# Patient Record
Sex: Female | Born: 1945
Health system: Southern US, Community
[De-identification: ages and names within clinical notes are randomized; demographics above are authoritative.]

## PROBLEM LIST (undated history)

## (undated) DIAGNOSIS — I4891 Unspecified atrial fibrillation: Secondary | ICD-10-CM

## (undated) DIAGNOSIS — R112 Nausea with vomiting, unspecified: Secondary | ICD-10-CM

## (undated) DIAGNOSIS — M1990 Unspecified osteoarthritis, unspecified site: Secondary | ICD-10-CM

## (undated) DIAGNOSIS — Z9889 Other specified postprocedural states: Secondary | ICD-10-CM

## (undated) DIAGNOSIS — C4442 Squamous cell carcinoma of skin of scalp and neck: Secondary | ICD-10-CM

## (undated) DIAGNOSIS — Z9289 Personal history of other medical treatment: Secondary | ICD-10-CM

## (undated) DIAGNOSIS — C44300 Unspecified malignant neoplasm of skin of unspecified part of face: Secondary | ICD-10-CM

## (undated) DIAGNOSIS — C9000 Multiple myeloma not having achieved remission: Secondary | ICD-10-CM

## (undated) HISTORY — PX: WISDOM TOOTH EXTRACTION: SHX21

## (undated) HISTORY — DX: Unspecified malignant neoplasm of skin of unspecified part of face: C44.300

## (undated) HISTORY — DX: Unspecified atrial fibrillation: I48.91

## (undated) HISTORY — PX: CATARACT EXTRACTION W/ INTRAOCULAR LENS  IMPLANT, BILATERAL: SHX1307

## (undated) HISTORY — PX: CHOLECYSTECTOMY OPEN: SUR202

## (undated) HISTORY — PX: TONSILLECTOMY AND ADENOIDECTOMY: SUR1326

## (undated) HISTORY — DX: Multiple myeloma not having achieved remission: C90.00

## (undated) HISTORY — PX: JOINT REPLACEMENT: SHX530

## (undated) HISTORY — DX: Unspecified osteoarthritis, unspecified site: M19.90

## (undated) HISTORY — PX: MOHS SURGERY: SUR867

## (undated) HISTORY — PX: OTHER SURGICAL HISTORY: SHX169

---

## 1978-11-07 DIAGNOSIS — Z9289 Personal history of other medical treatment: Secondary | ICD-10-CM

## 1978-11-07 HISTORY — DX: Personal history of other medical treatment: Z92.89

## 1998-03-24 ENCOUNTER — Other Ambulatory Visit: Admission: RE | Admit: 1998-03-24 | Discharge: 1998-03-24 | Payer: Self-pay | Admitting: Obstetrics and Gynecology

## 1998-03-27 ENCOUNTER — Other Ambulatory Visit: Admission: RE | Admit: 1998-03-27 | Discharge: 1998-03-27 | Payer: Self-pay | Admitting: Obstetrics and Gynecology

## 1998-11-07 DIAGNOSIS — C4442 Squamous cell carcinoma of skin of scalp and neck: Secondary | ICD-10-CM

## 1998-11-07 HISTORY — DX: Squamous cell carcinoma of skin of scalp and neck: C44.42

## 1999-03-25 ENCOUNTER — Other Ambulatory Visit: Admission: RE | Admit: 1999-03-25 | Discharge: 1999-03-25 | Payer: Self-pay | Admitting: Obstetrics and Gynecology

## 2000-03-24 ENCOUNTER — Other Ambulatory Visit: Admission: RE | Admit: 2000-03-24 | Discharge: 2000-03-24 | Payer: Self-pay | Admitting: Obstetrics and Gynecology

## 2001-03-26 ENCOUNTER — Other Ambulatory Visit: Admission: RE | Admit: 2001-03-26 | Discharge: 2001-03-26 | Payer: Self-pay | Admitting: Obstetrics and Gynecology

## 2002-06-11 ENCOUNTER — Other Ambulatory Visit: Admission: RE | Admit: 2002-06-11 | Discharge: 2002-06-11 | Payer: Self-pay | Admitting: Obstetrics and Gynecology

## 2003-07-09 ENCOUNTER — Other Ambulatory Visit: Admission: RE | Admit: 2003-07-09 | Discharge: 2003-07-09 | Payer: Self-pay | Admitting: Obstetrics and Gynecology

## 2004-09-27 ENCOUNTER — Other Ambulatory Visit: Admission: RE | Admit: 2004-09-27 | Discharge: 2004-09-27 | Payer: Self-pay | Admitting: Obstetrics and Gynecology

## 2004-09-28 ENCOUNTER — Ambulatory Visit: Payer: Self-pay | Admitting: Internal Medicine

## 2005-08-30 ENCOUNTER — Ambulatory Visit: Payer: Self-pay | Admitting: Internal Medicine

## 2005-11-17 ENCOUNTER — Ambulatory Visit: Payer: Self-pay | Admitting: Internal Medicine

## 2005-11-30 ENCOUNTER — Ambulatory Visit: Payer: Self-pay | Admitting: Internal Medicine

## 2005-12-15 ENCOUNTER — Other Ambulatory Visit: Admission: RE | Admit: 2005-12-15 | Discharge: 2005-12-15 | Payer: Self-pay | Admitting: Obstetrics & Gynecology

## 2006-07-19 ENCOUNTER — Ambulatory Visit: Payer: Self-pay | Admitting: Internal Medicine

## 2007-01-08 ENCOUNTER — Other Ambulatory Visit: Admission: RE | Admit: 2007-01-08 | Discharge: 2007-01-08 | Payer: Self-pay | Admitting: Obstetrics & Gynecology

## 2007-07-25 ENCOUNTER — Encounter: Payer: Self-pay | Admitting: Internal Medicine

## 2007-08-02 ENCOUNTER — Encounter: Payer: Self-pay | Admitting: Internal Medicine

## 2008-01-14 ENCOUNTER — Other Ambulatory Visit: Admission: RE | Admit: 2008-01-14 | Discharge: 2008-01-14 | Payer: Self-pay | Admitting: Obstetrics and Gynecology

## 2008-09-18 ENCOUNTER — Ambulatory Visit: Payer: Self-pay | Admitting: Internal Medicine

## 2008-10-13 ENCOUNTER — Encounter: Payer: Self-pay | Admitting: Internal Medicine

## 2009-01-01 ENCOUNTER — Encounter: Payer: Self-pay | Admitting: Internal Medicine

## 2009-01-19 ENCOUNTER — Other Ambulatory Visit: Admission: RE | Admit: 2009-01-19 | Discharge: 2009-01-19 | Payer: Self-pay | Admitting: Obstetrics and Gynecology

## 2010-01-07 ENCOUNTER — Encounter: Payer: Self-pay | Admitting: Internal Medicine

## 2010-01-08 ENCOUNTER — Encounter: Payer: Self-pay | Admitting: Internal Medicine

## 2010-01-20 ENCOUNTER — Ambulatory Visit: Payer: Self-pay | Admitting: Internal Medicine

## 2010-01-20 DIAGNOSIS — M19042 Primary osteoarthritis, left hand: Secondary | ICD-10-CM

## 2010-01-20 DIAGNOSIS — Z85828 Personal history of other malignant neoplasm of skin: Secondary | ICD-10-CM | POA: Insufficient documentation

## 2010-01-20 DIAGNOSIS — M19041 Primary osteoarthritis, right hand: Secondary | ICD-10-CM | POA: Insufficient documentation

## 2010-11-07 HISTORY — PX: SHOULDER ARTHROSCOPY W/ ROTATOR CUFF REPAIR: SHX2400

## 2010-12-07 NOTE — Assessment & Plan Note (Signed)
Summary: shingles vac/cbs  Nurse Visit       Zostavax # 1    Vaccine Type: Zostavax    Site: left deltoid    Mfr: Merck    Dose: 0.70mL    Route: [place]    Given by: Floydene Flock CMA    Exp. Date: 12/21/2009    Lot #: [id number]   Orders Added: 1)  Zoster (Shingles) Vaccine Live [90736] 2)  Admin 1st Vaccine [email or web address]    ]

## 2010-12-07 NOTE — Letter (Signed)
Summary: External Correspondence--[facility]  External Correspondence--[facility]   Imported By: Freddy Jaksch 08/01/2007 10:14:08  _____________________________________________________________________  External Attachment:    Type:   Image     Comment:   External Document

## 2010-12-07 NOTE — Assessment & Plan Note (Signed)
Summary: [facility]///[facility]   Vital Signs:  Patient profile:   65 year old Jennifer [name] Height:      65.75 inches Weight:      161.4 pounds BMI:     26.34 Temp:     97.8 degrees F oral Pulse rate:   67 / minute Resp:     14 per minute BP sitting:   118 / 72  (left arm) Cuff size:   large  Vitals Entered By: Shonna Chock (January 20, 2010 9:46 AM)  Comments REVIEWED MED LIST, PATIENT AGREED DOSE AND INSTRUCTION CORRECT    History of Present Illness: Jennifer Jennifer [name] is here for a physical; she has chronic LS pain. Chiropractry & stretching are  beneficial as is a tilt table. Preventive Care interventions reviewed ; all up to date including shingles immunization & flu shot. Living Will in place  Preventive Screening-Counseling & Management  Alcohol-Tobacco     Smoking Status: quit  Caffeine-Diet-Exercise     Does Patient Exercise: yes  Allergies (verified): No Known Drug Allergies  Past History:  Past Medical History: Post Menopausal Skin cancer, hx of,squamous cell of scalp X 2 , Dr Danella Deis  Past Surgical History: Colonoscopy X 4 , all negative; G 3 P 1 M 2; Wisdom Teeth Extraction Cholecystectomy Squamous cell cancer of scalp resected X 2  Family History: Father: CHF, accelerated dementia Mother: Myasthenia Gravis, CAD, dementia Siblings: bro DJD , prostate CA; M uncle CA ? primary; PGM breast CA; P aunt breast CA  Social History: Occupation: Psychologist, sport and exercise Married Former Smoker: quit 1980 Alcohol use-yes: socially Regular exercise-yes: CVE as walking 45-60 min w/o symptoms Smoking Status:  quit Does Patient Exercise:  yes  Review of Systems  The patient denies anorexia, fever, vision loss, decreased hearing, hoarseness, chest pain, syncope, dyspnea on exertion, peripheral edema, prolonged cough, headaches, hemoptysis, abdominal pain, melena, hematochezia, severe indigestion/heartburn, suspicious skin lesions, depression, unusual weight change, abnormal bleeding,  enlarged lymph nodes, and angioedema.         Weight gain of 15# with travel. GU:  Denies discharge, dysuria, hematuria, and incontinence; Shirlyn Goltz NP seen annually. MS:  Complains of joint pain and low back pain; denies joint redness, joint swelling, mid back pain, muscle weakness, and thoracic pain; Hip & LBP ; Rx: Glucosamine with some benefit. Neuro:  Denies brief paralysis, numbness, poor balance, tingling, and weakness; No radicular leg pain.  Physical Exam  General:  well-nourished; alert,appropriate and cooperative throughout examination Head:  Normocephalic and atraumatic without obvious abnormalities.  Eyes:  No corneal or conjunctival inflammation noted. EOMI. Perrla. Funduscopic exam benign, without hemorrhages, exudates or papilledema. Vision grossly normal. Ears:  External ear exam shows no significant lesions or deformities.  Otoscopic examination reveals clear canals, tympanic membranes are intact bilaterally without bulging, retraction, inflammation or discharge. Hearing is grossly normal bilaterally. Nose:  External nasal examination shows no deformity or inflammation. Nasal mucosa are pink and moist without lesions or exudates. Mouth:  Oral mucosa and oropharynx without lesions or exudates.  Teeth in good repair. Neck:  No deformities, masses, or tenderness noted.Minimal asymmetry of thyroid ; R > L. No nodules Lungs:  Normal respiratory effort, chest expands symmetrically. Lungs are clear to auscultation, no crackles or wheezes. Heart:  regular rhythm, no murmur, no gallop, no rub, no JVD, no HJR, and bradycardia.   Abdomen:  Bowel sounds positive,abdomen soft and non-tender without masses, organomegaly or hernias noted. Aortic bruit w/o AAA Genitalia:  Jennifer Grubb,NP Msk:  No  deformity or scoliosis noted of thoracic or lumbar spine.   Pulses:  R and L carotid,radial,dorsalis pedis and posterior tibial pulses are full and equal bilaterally Extremities:  No clubbing,  cyanosis, edema, or deformity noted with normal full range of motion of all joints.  Neg SLR; minor crepitus in knees  Neurologic:  alert & oriented X3, strength normal in all extremities, and DTRs symmetrical and normal.   Skin:  Intact without suspicious lesions or rashes Cervical Nodes:  No lymphadenopathy noted Axillary Nodes:  No palpable lymphadenopathy Psych:  memory intact for recent and remote, normally interactive, and good eye contact.     Impression & Recommendations:  Problem # 1:  PREVENTIVE HEALTH CARE (ICD-V70.0)  Orders: EKG w/ Interpretation (93000)  Problem # 2:  DEGENERATIVE JOINT DISEASE (ICD-715.90)  Her updated medication list for this problem includes:    Tramadol Hcl 50 Mg Tabs (Tramadol hcl) .Marland Kitchen... 1 q 6 hrs as needed joint pain  Problem # 3:  SKIN CANCER, HX OF (ICD-V10.83) as per Dr Danella Deis  Complete Medication List: 1)  Prempro 0.625-2.5 Mg Tabs (Conj estrog-medroxyprogest ace) .Marland Kitchen.. 1 by mouth once daily 2)  Tramadol Hcl 50 Mg Tabs (Tramadol hcl) .Marland Kitchen.. 1 q 6 hrs as needed joint pain  Patient Instructions: 1)  Carry corrected Health Record when traveling or seeing any health care providers Prescriptions: TRAMADOL HCL 50 MG TABS (TRAMADOL HCL) 1 q 6 hrs as needed joint pain  #30 x 5   Entered and Authorized by:   Marga Melnick MD   Signed by:   Marga Melnick MD on 01/20/2010   Method used:   Print then Give to Patient   RxID:   [id number]

## 2010-12-07 NOTE — Letter (Signed)
Summary: [facility]  [facility] [place]   Imported By: Lanelle Bal 10/22/2008 10:16:48  _____________________________________________________________________  External Attachment:    Type:   Image     Comment:   External Document

## 2010-12-07 NOTE — Letter (Signed)
Summary: IMAGING-BONE DENSITY  IMAGING-BONE DENSITY   Imported By: Doristine Devoid 08/17/2007 16:38:56  _____________________________________________________________________  External Attachment:    Type:   Image     Comment:   External Document

## 2010-12-08 DIAGNOSIS — I4891 Unspecified atrial fibrillation: Secondary | ICD-10-CM

## 2010-12-08 HISTORY — DX: Unspecified atrial fibrillation: I48.91

## 2011-02-03 ENCOUNTER — Encounter: Payer: Self-pay | Admitting: Internal Medicine

## 2011-03-15 ENCOUNTER — Ambulatory Visit (INDEPENDENT_AMBULATORY_CARE_PROVIDER_SITE_OTHER): Payer: Medicare Other

## 2011-03-15 DIAGNOSIS — Z23 Encounter for immunization: Secondary | ICD-10-CM

## 2011-03-15 MED ORDER — TETANUS-DIPHTH-ACELL PERTUSSIS 5-2.5-18.5 LF-MCG/0.5 IM SUSP
0.5000 mL | Freq: Once | INTRAMUSCULAR | Status: AC
  Administered 2011-03-15: 0.5 mL via INTRAMUSCULAR

## 2011-03-25 NOTE — Assessment & Plan Note (Signed)
[facility]                                   ON-CALL NOTE   NAME:THORNEShuronda, Jennifer                        MRN:          [id number]  DATE:05/20/2006                            DOB:          Dec 05, 1945    PHONE CONSULT - 9:24 A.M.  Phone number [phone]   OBJECTIVE:  The patient has burning and dysuria with urgency.  She feels she  has a bladder infection and is in [place], [place], and would like an  antibiotic called in.  The patient is a personal friend and a patient of Dr.  Alwyn Ren, longstanding.  I spoke with Dr. Alwyn Ren, who is on call today, and he  suggests calling in antibiotic to her.  The patient has no known drug  allergies.   ASSESSMENT:  Presumed urinary infection.   PLAN:  Trial of Macrobid 100 mg b.i.d., #20 and no refills and Pyridium 200  mg t.i.d. p.r.n., #9 and no refills, was called to [facility] at [phone].  The  patient knows to come if symptoms do not improve.  Primary care [place] is  Dr. Alwyn Ren.  Home office is [place].                                   Arta Silence, MD   RNS/MedQ  DD:  05/20/2006  DT:  05/20/2006  Job #:  [id number]   cc:   Titus Dubin. Alwyn Ren, MD, FCCP

## 2011-09-12 ENCOUNTER — Ambulatory Visit
Admission: RE | Admit: 2011-09-12 | Discharge: 2011-09-12 | Disposition: A | Discharge status: Home / Self Care | Payer: Medicare Other | Source: Ambulatory Visit | Attending: Orthopaedic Surgery | Admitting: Orthopaedic Surgery

## 2011-09-12 ENCOUNTER — Other Ambulatory Visit: Payer: Self-pay | Admitting: Orthopaedic Surgery

## 2011-09-12 DIAGNOSIS — M25511 Pain in right shoulder: Secondary | ICD-10-CM

## 2012-02-22 ENCOUNTER — Other Ambulatory Visit: Payer: Self-pay | Admitting: Orthopaedic Surgery

## 2012-02-22 ENCOUNTER — Ambulatory Visit
Admission: RE | Admit: 2012-02-22 | Discharge: 2012-02-22 | Disposition: A | Discharge status: Home / Self Care | Payer: BC Managed Care – PPO | Source: Ambulatory Visit | Attending: Orthopaedic Surgery | Admitting: Orthopaedic Surgery

## 2012-02-22 DIAGNOSIS — M25561 Pain in right knee: Secondary | ICD-10-CM

## 2012-03-13 ENCOUNTER — Telehealth: Payer: Self-pay | Admitting: Internal Medicine

## 2012-03-13 NOTE — Telephone Encounter (Signed)
Pt is calling to find out if you have a recommendation for an ENT in the [place] area. Pt states she is living at [facility] and having some problems with her ear.

## 2012-03-13 NOTE — Telephone Encounter (Signed)
I am so sorry , but I do not know any specialists there

## 2012-03-13 NOTE — Telephone Encounter (Signed)
Discuss with patient  

## 2012-03-13 NOTE — Telephone Encounter (Signed)
Left message to call office

## 2012-03-14 ENCOUNTER — Encounter: Payer: Self-pay | Admitting: Internal Medicine

## 2012-03-14 ENCOUNTER — Ambulatory Visit (INDEPENDENT_AMBULATORY_CARE_PROVIDER_SITE_OTHER): Payer: Medicare Other | Admitting: Internal Medicine

## 2012-03-14 VITALS — BP 120/72 | HR 86 | Temp 97.6°F | Wt 156.8 lb

## 2012-03-14 DIAGNOSIS — H9209 Otalgia, unspecified ear: Secondary | ICD-10-CM

## 2012-03-14 DIAGNOSIS — H9202 Otalgia, left ear: Secondary | ICD-10-CM

## 2012-03-14 DIAGNOSIS — S0300XA Dislocation of jaw, unspecified side, initial encounter: Secondary | ICD-10-CM

## 2012-03-14 NOTE — Patient Instructions (Signed)
Use Eucerin ,a  moisturizing agent , mixed one part to one part of Cort Aid  and applied twice a day into canal. Consider glucosamine sulfate 1500 mg daily for joint symptoms. Take this daily  for 3 months and then leave it off for 2 months. This will rehydrate the cartilages.

## 2012-03-14 NOTE — Progress Notes (Signed)
  Subjective:    Patient ID: Jennifer [name], female    DOB: 1946/09/30, 66 y.o.   MRN: [id number]  HPI She's noted a "crusty or itchy" sensation in the left ear canal for approximately 3 months. She feels there is some swelling based on some resistance when she places a Q-tip in the ear. Her pharmacist recommended olive oil which has not improved the situation. There has been some associated hearing loss, but there's been no significant pain , tinnitus or discharge    Review of Systems she denies frontal headaches, facial pain, nasal purulence or dental pain. She also has not had fever, chills, or sweats.     Objective:   Physical Exam General appearance:good health ;well nourished; no acute distress or increased work of breathing is present.  No  lymphadenopathy about the head, neck, or axilla noted.   Eyes: No conjunctival inflammation or lid edema is present.   Ears:  External ear exam shows no significant lesions or deformities.  Otoscopic examination reveals clear canals, but the left canal appear to be decreased in diameter by increase in the diameter inferior wall. There is no erythema or tenderness in this area when the speculum is advanced.The tympanic membranes are intact bilaterally without bulging, retraction, inflammation or discharge. She has dramatic temporal mandibular joint findings on the left with some dislocation and clicking  Nose:  External nasal examination shows no deformity or inflammation. Nasal mucosa are pink and moist without lesions or exudates. No septal dislocation or deviation.No obstruction to airflow.   Oral exam: Dental hygiene is good; lips and gums are healthy appearing.There is no oropharyngeal erythema or exudate noted.   Neck:  No deformities, thyromegaly, masses, or tenderness noted. Physiologic asymmetry of the thyroid is present without nodularity.  Supple with full range of motion without pain.             Assessment & Plan:  #1 subjective  changes in the left ear without definite  active infection. There may be some accentuation of the tissues inferiorly in the left otic canal.  #2 significant temporomandibular joint dysfunction, left greater than right.  Plan: The testing will be recommended. I do recommend an ear nose and throat evaluation of the questionable changes inferiorly in the left otic canal is symptoms persist after a trial of topical steroids & NSAIDS

## 2012-03-28 ENCOUNTER — Encounter: Payer: Self-pay | Admitting: Internal Medicine

## 2012-04-10 ENCOUNTER — Encounter: Payer: Self-pay | Admitting: Internal Medicine

## 2012-10-12 ENCOUNTER — Encounter: Payer: Self-pay | Admitting: Internal Medicine

## 2012-10-12 ENCOUNTER — Ambulatory Visit (INDEPENDENT_AMBULATORY_CARE_PROVIDER_SITE_OTHER): Payer: Medicare Other | Admitting: Internal Medicine

## 2012-10-12 VITALS — BP 118/80 | HR 62 | Resp 12 | Ht 67.0 in | Wt 158.4 lb

## 2012-10-12 DIAGNOSIS — Z23 Encounter for immunization: Secondary | ICD-10-CM

## 2012-10-12 DIAGNOSIS — I739 Peripheral vascular disease, unspecified: Secondary | ICD-10-CM

## 2012-10-12 DIAGNOSIS — Z85828 Personal history of other malignant neoplasm of skin: Secondary | ICD-10-CM

## 2012-10-12 DIAGNOSIS — I4891 Unspecified atrial fibrillation: Secondary | ICD-10-CM

## 2012-10-12 DIAGNOSIS — Z0000 Encounter for general adult medical examination without abnormal findings: Secondary | ICD-10-CM

## 2012-10-12 LAB — CBC WITH DIFFERENTIAL/PLATELET
Basophils Absolute: 0 10*3/uL (ref 0.0–0.1)
Basophils Relative: 0 % (ref 0–1)
Eosinophils Absolute: 0 10*3/uL (ref 0.0–0.7)
Eosinophils Relative: 0 % (ref 0–5)
HCT: 38.6 % (ref 36.0–46.0)
Hemoglobin: 13.2 g/dL (ref 12.0–15.0)
Lymphocytes Relative: 12 % (ref 12–46)
Lymphs Abs: 0.9 10*3/uL (ref 0.7–4.0)
MCH: 31.9 pg (ref 26.0–34.0)
MCHC: 34.2 g/dL (ref 30.0–36.0)
MCV: 93.2 fL (ref 78.0–100.0)
Monocytes Absolute: 0 10*3/uL — ABNORMAL LOW (ref 0.1–1.0)
Monocytes Relative: 0 % — ABNORMAL LOW (ref 3–12)
Neutro Abs: 6.4 10*3/uL (ref 1.7–7.7)
Neutrophils Relative %: 88 % — ABNORMAL HIGH (ref 43–77)
Platelets: 255 10*3/uL (ref 150–400)
RBC: 4.14 MIL/uL (ref 3.87–5.11)
RDW: 12.6 % (ref 11.5–15.5)
WBC: 7.4 10*3/uL (ref 4.0–10.5)

## 2012-10-12 LAB — LIPID PANEL
Cholesterol: 234 mg/dL — ABNORMAL HIGH (ref 0–200)
HDL: 79 mg/dL (ref >39)
LDL Cholesterol: 143 mg/dL — ABNORMAL HIGH (ref 0–99)
Total CHOL/HDL Ratio: 3 Ratio
Triglycerides: 58 mg/dL (ref <150)
VLDL: 12 mg/dL (ref 0–40)

## 2012-10-12 LAB — BASIC METABOLIC PANEL
BUN: 19 mg/dL (ref 6–23)
CO2: 25 mEq/L (ref 19–32)
Calcium: 9.7 mg/dL (ref 8.4–10.5)
Chloride: 103 mEq/L (ref 96–112)
Creat: 0.85 mg/dL (ref 0.50–1.10)
Glucose, Bld: 115 mg/dL — ABNORMAL HIGH (ref 70–99)
Potassium: 4.2 mEq/L (ref 3.5–5.3)
Sodium: 136 mEq/L (ref 135–145)

## 2012-10-12 LAB — TSH: TSH: 1.031 u[IU]/mL (ref 0.350–4.500)

## 2012-10-12 NOTE — Progress Notes (Signed)
Subjective:    Patient ID: Jennifer [name], female    DOB: 07/21/1946, 66 y.o.   MRN: [id number]  HPI Medicare Wellness Visit:  The following psychosocial & medical history were reviewed as required by Medicare.   Social history: caffeine: 1 cup / day , alcohol: 7 shots/ week ,  tobacco use :quit 1972  & exercise : 12 hrs / week.   Home & personal  safety / fall risk: no issues, activities of daily living: no limitations , seatbelt use :yes , and smoke alarm employment : yes.  Power of Attorney/Living Will status : in place  Vision ( as recorded per Nurse) & Hearing  evaluation :  Ophth exam < 12 mos; no hearing exam. Orientation :oriented X 3 , memory & recall : good,  math testing: good,and mood & affect : normal . Depression / anxiety: denied Travel history : [place] 2007 , immunization status : up to date , transfusion history:  Post partum 1980, and preventive health surveillance ( colonoscopies, BMD , etc as per protocol/ [facility]): colonoscopy up to date, Dental care:  Every 6 mos . Chart reviewed &  Updated. Active issues reviewed & addressed.       Review of Systems Except for degenerative joint disease; she is essentially asymptomatic. As noted she exercises at a high level. She is on a heart healthy diet. Her right knee was injected today. She is also taking physical therapy.  She experiences muscle cramps if she exercises and heat. This is treated with magnesium. She denies any constitutional symptoms of fever, chills, sweats, or weight loss. She has no limb weakness, numbness or tingling. There's no incontinence of urine or stool.          Objective:   Physical Exam Gen.: Thin but healthy and well-nourished in appearance. Alert, appropriate and cooperative throughout exam. Head: Normocephalic without obvious abnormalities  Eyes: No corneal or conjunctival inflammation noted. Pterygium OS medially Ears: External  ear exam reveals no significant lesions or deformities. Acute  angle L canal Nose: External nasal exam reveals no deformity or inflammation. Nasal mucosa are pink and moist. No lesions or exudates noted.  Mouth: Oral mucosa and oropharynx reveal no lesions or exudates. Teeth in good repair. Neck: No deformities, masses, or tenderness noted. Range of motion &Thyroid normal. Lungs: Normal respiratory effort; chest expands symmetrically. Lungs are clear to auscultation without rales, wheezes, or increased work of breathing. Heart: Normal rate and rhythm. Normal S1 and S2. No gallop, click, or rub. S4 w/o murmur. Abdomen: Bowel sounds normal; abdomen soft and nontender. No masses, organomegaly or hernias noted. An aortic bruit is present; the aorta is palpable without enlargement Genitalia: Rock Nephew, NP Musculoskeletal/extremities: No deformity or scoliosis noted of  the thoracic or lumbar spine. No clubbing, cyanosis, edema noted. Minor degenerative joint changes of the fingers are present. Crepitus is present in the left knee. The right knee was not evaluated with range of motion or reflexes because of the injection today.Tone & strength  normal. Nail health  good. Vascular: Carotid, radial artery, dorsalis pedis and  posterior tibial pulses are full and equal. No bruits present. Neurologic: Alert and oriented x3. Deep tendon reflexes symmetrical in biceps          Skin: Intact without suspicious lesions or rashes. Lymph: No cervical, axillary lymphadenopathy present. Psych: Mood and affect are normal. Normally interactive  Assessment & Plan:  #1 Medicare Wellness Exam; criteria met ; data entered #2 DJD #3 aortic bruit without clinical evidence of aneurysm Plan: see Orders

## 2012-10-12 NOTE — Patient Instructions (Addendum)
If you activate My Chart; the results can be released to you as soon as they populate from the lab. If you choose not to use this program; the labs have to be reviewed, copied & mailed   causing a delay in getting the results to you. 

## 2012-12-05 ENCOUNTER — Encounter: Payer: Self-pay | Admitting: Internal Medicine

## 2012-12-22 ENCOUNTER — Other Ambulatory Visit: Payer: Self-pay

## 2013-01-16 ENCOUNTER — Telehealth: Payer: Self-pay | Admitting: Internal Medicine

## 2013-01-16 DIAGNOSIS — Z1211 Encounter for screening for malignant neoplasm of colon: Secondary | ICD-10-CM

## 2013-01-16 NOTE — Telephone Encounter (Signed)
Order placed, patient will be contacted by referral coordinator or GI once appointment set up

## 2013-01-16 NOTE — Telephone Encounter (Signed)
pt sent my chart message requesting colonoscopy

## 2013-01-17 ENCOUNTER — Encounter: Payer: Self-pay | Admitting: Internal Medicine

## 2013-02-05 HISTORY — PX: COLONOSCOPY: SHX174

## 2013-02-15 ENCOUNTER — Ambulatory Visit (AMBULATORY_SURGERY_CENTER): Payer: Medicare Other | Admitting: *Deleted

## 2013-02-15 VITALS — Ht 67.0 in | Wt 153.6 lb

## 2013-02-15 DIAGNOSIS — Z1211 Encounter for screening for malignant neoplasm of colon: Secondary | ICD-10-CM

## 2013-02-15 MED ORDER — MOVIPREP 100 G PO SOLR: 1.0000 | Freq: Once | ORAL | Status: DC

## 2013-02-15 NOTE — Progress Notes (Signed)
NO EGG OR SOY ALLERGY. EWM NO PROBLEMS WITH SEDATION IN THE PAST. EWM 

## 2013-03-08 ENCOUNTER — Encounter: Payer: Self-pay | Admitting: Internal Medicine

## 2013-03-08 ENCOUNTER — Ambulatory Visit (AMBULATORY_SURGERY_CENTER): Payer: Medicare Other | Admitting: Internal Medicine

## 2013-03-08 VITALS — BP 176/93 | HR 63 | Temp 97.1°F | Resp 16 | Ht 67.0 in | Wt 153.0 lb

## 2013-03-08 DIAGNOSIS — Z1211 Encounter for screening for malignant neoplasm of colon: Secondary | ICD-10-CM

## 2013-03-08 MED ORDER — SODIUM CHLORIDE 0.9 % IV SOLN: 500.0000 mL | INTRAVENOUS | Status: DC

## 2013-03-08 NOTE — Progress Notes (Signed)
Pt's blood pressure elevated. Pt denies hx of blood pressure problems or any medications usage. Pt states she has a headache rating it 8/10. Notified Dr. Juanda Chance, will continue to assess and evaluate.

## 2013-03-08 NOTE — Progress Notes (Signed)
Patient's position gradually changed to upright, readjusted bp cuff. Patient seen by CRNA and Dr. Juanda Chance. Patient had crangrape juice, denies any headache or other symptoms.cleared for discharge.no dizziness or any other symptoms on rising.

## 2013-03-08 NOTE — Progress Notes (Signed)
Report to pacu rn, vss, bbs=clear 

## 2013-03-08 NOTE — Progress Notes (Signed)
Patient did not experience any of the following events: a burn prior to discharge; a fall within the facility; wrong site/side/patient/procedure/implant event; or a hospital transfer or hospital admission upon discharge from the facility. (G8907) Patient did not have preoperative order for IV antibiotic SSI prophylaxis. (G8918)  

## 2013-03-08 NOTE — Op Note (Signed)
[facility] [address] [place], [address]   COLONOSCOPY PROCEDURE REPORT  PATIENT: Tashara, Suder  MR#: [id number] BIRTHDATE: 05-15-1946 , 66  yrs. old GENDER: Female ENDOSCOPIST: Hart Carwin, MD REFERRED BY:  recall colonoscopy PROCEDURE DATE:  03/08/2013 PROCEDURE:   Colonoscopy, screening ASA CLASS:   Class II INDICATIONS:Average risk patient for colon cancer and colonoscopy in1998,2004,maternal uncle and maternal nephew with colon cancer. MEDICATIONS: MAC sedation, administered by CRNA and Propofol (Diprivan) 220 mg IV  DESCRIPTION OF PROCEDURE:   After the risks and benefits and of the procedure were explained, informed consent was obtained.  A digital rectal exam revealed no abnormalities of the rectum.    The LB [id number]-[id number]  endoscope was introduced through the anus and advanced to the cecum, which was identified by both the appendix and ileocecal valve .  The quality of the prep was good, using MoviPrep .  The instrument was then slowly withdrawn as the colon was fully examined.     COLON FINDINGS: A normal appearing cecum, ileocecal valve, and appendiceal orifice were identified.  The ascending, hepatic flexure, transverse, splenic flexure, descending, sigmoid colon and rectum appeared unremarkable.  No polyps or cancers were seen. Retroflexed views revealed no abnormalities.     The scope was then withdrawn from the patient and the procedure completed.  COMPLICATIONS: There were no complications. ENDOSCOPIC IMPRESSION: Normal colon minimal diverticulosis of the descending colon  RECOMMENDATIONS: High fiber diet   REPEAT EXAM: In 10 year(s)  for Colonoscopy.  XB:[id number] Minerva Ends, MD  _______________________________ eSigned:  Hart Carwin, MD 03/08/2013 11:42 AM     PATIENT NAME:  Zaakirah, Kistner MR#: [id number]

## 2013-03-08 NOTE — Patient Instructions (Addendum)
Impressions/recommendations:  Normal colon  Diverticulosis (handout given) High Fiber Diet (handout given)  Repeat colonoscopy in 10 years.  YOU HAD AN ENDOSCOPIC PROCEDURE TODAY AT THE [facility]: Refer to the procedure report that was given to you for any specific questions about what was found during the examination.  If the procedure report does not answer your questions, please call your gastroenterologist to clarify.  If you requested that your care partner not be given the details of your procedure findings, then the procedure report has been included in a sealed envelope for you to review at your convenience later.  YOU SHOULD EXPECT: Some feelings of bloating in the abdomen. Passage of more gas than usual.  Walking can help get rid of the air that was put into your GI tract during the procedure and reduce the bloating. If you had a lower endoscopy (such as a colonoscopy or flexible sigmoidoscopy) you may notice spotting of blood in your stool or on the toilet paper. If you underwent a bowel prep for your procedure, then you may not have a normal bowel movement for a few days.  DIET: Your first meal following the procedure should be a light meal and then it is ok to progress to your normal diet.  A half-sandwich or bowl of soup is an example of a good first meal.  Heavy or fried foods are harder to digest and may make you feel nauseous or bloated.  Likewise meals heavy in dairy and vegetables can cause extra gas to form and this can also increase the bloating.  Drink plenty of fluids but you should avoid alcoholic beverages for 24 hours.  ACTIVITY: Your care partner should take you home directly after the procedure.  You should plan to take it easy, moving slowly for the rest of the day.  You can resume normal activity the day after the procedure however you should NOT DRIVE or use heavy machinery for 24 hours (because of the sedation medicines used during the test).    SYMPTOMS  TO REPORT IMMEDIATELY: A gastroenterologist can be reached at any hour.  During normal business hours, 8:30 AM to 5:00 PM Monday through Friday, call [phone].  After hours and on weekends, please call the GI answering service at [phone] who will take a message and have the physician on call contact you.   Following lower endoscopy (colonoscopy or flexible sigmoidoscopy):  Excessive amounts of blood in the stool  Significant tenderness or worsening of abdominal pains  Swelling of the abdomen that is new, acute  Fever of 100F or higher   FOLLOW UP: If any biopsies were taken you will be contacted by phone or by letter within the next 1-3 weeks.  Call your gastroenterologist if you have not heard about the biopsies in 3 weeks.  Our staff will call the home number listed on your records the next business day following your procedure to check on you and address any questions or concerns that you may have at that time regarding the information given to you following your procedure. This is a courtesy call and so if there is no answer at the home number and we have not heard from you through the emergency physician on call, we will assume that you have returned to your regular daily activities without incident.  SIGNATURES/CONFIDENTIALITY: You and/or your care partner have signed paperwork which will be entered into your electronic medical record.  These signatures attest to the fact that that the  information above on your After Visit Summary has been reviewed and is understood.  Full responsibility of the confidentiality of this discharge information lies with you and/or your care-partner.

## 2013-03-11 ENCOUNTER — Telehealth: Payer: Self-pay | Admitting: *Deleted

## 2013-03-11 NOTE — Telephone Encounter (Signed)
  Follow up Call-  Call back number 03/08/2013  Post procedure Call Back phone  # [phone]  Permission to leave phone message Yes     Patient questions:  Do you have a fever, pain , or abdominal swelling? no Pain Score  0 *  Have you tolerated food without any problems? yes  Have you been able to return to your normal activities? yes  Do you have any questions about your discharge instructions: Diet   no Medications  no Follow up visit  no  Do you have questions or concerns about your Care? no  Actions: * If pain score is 4 or above: No action needed, pain <4.  Patient states she checked her BP over few days and it came down. Denies any problems or concerns.

## 2013-03-21 ENCOUNTER — Encounter: Payer: Self-pay | Admitting: *Deleted

## 2013-03-22 ENCOUNTER — Ambulatory Visit (INDEPENDENT_AMBULATORY_CARE_PROVIDER_SITE_OTHER): Payer: Medicare Other | Admitting: Nurse Practitioner

## 2013-03-22 ENCOUNTER — Encounter: Payer: Self-pay | Admitting: Nurse Practitioner

## 2013-03-22 VITALS — BP 110/70 | Ht 65.5 in | Wt 153.0 lb

## 2013-03-22 DIAGNOSIS — Z01419 Encounter for gynecological examination (general) (routine) without abnormal findings: Secondary | ICD-10-CM

## 2013-03-22 DIAGNOSIS — Z780 Asymptomatic menopausal state: Secondary | ICD-10-CM

## 2013-03-22 MED ORDER — MEDROXYPROGESTERONE ACETATE 2.5 MG PO TABS: ORAL_TABLET | ORAL | Status: DC

## 2013-03-22 MED ORDER — ESTRADIOL 0.5 MG PO TABS: ORAL_TABLET | ORAL | Status: DC

## 2013-03-22 NOTE — Progress Notes (Signed)
67 y.o. Married Caucasian Fe here for annual exam.  Feels well no new health concerns.  No vaso symptoms.  Wants to continue with HRT.  Patient's last menstrual period was 11/07/1996.          Sexually active: no  The current method of family planning is abstinence.    Exercising: yes  pickleball, weights & golf Smoker:  no  Health Maintenance: Pap:  01-28-10 neg MMG:  02-20-13  Colonoscopy:  02/18/13 normal repeat in 10 years BMD:   03/19/12 normal TDaP:  2012 Labs: done at PCP   reports that she quit smoking about 42 years ago. She has never used smokeless tobacco. She reports that she drinks about 4.2 ounces of alcohol per week. She reports that she does not use illicit drugs.  Past Medical History  Diagnosis Date  . DJD (degenerative joint disease)   . Blood transfusion without reported diagnosis   . Atrial fibrillation 12/2010    PMH of ; [place] , [place] [facility]  . Cancer     SKIN CANCER IN [facility]ALP    Past Surgical History  Procedure Laterality Date  . G 3 p 1    . Colonoscopy      negative X 3; Dr Juanda Chance  . Cholecystectomy    . T&a    . Wisdom tooth extraction    . Shoulder surgery  2012    R ; Dr Cleophas Dunker for rotator tear    Current Outpatient Prescriptions  Medication Sig Dispense Refill  . estradiol (ESTRACE) 0.5 MG tablet Take 0.5 mg by mouth. 1/2 by mouth daily      . medroxyPROGESTERone (PROVERA) 2.5 MG tablet Take 2.5 mg by mouth. Take 1/2 tablet daily       No current facility-administered medications for this visit.    Family History  Problem Relation Age of Onset  . Atrial fibrillation Father   . Asthma Brother   . Colon cancer Neg Hx   . Rectal cancer Neg Hx   . Stomach cancer Neg Hx   . Alzheimer's disease Mother   . Myasthenia gravis Mother     ocular  . Breast cancer Mother   . Depression Daughter   . Breast cancer Paternal Grandmother     ROS:  Pertinent items are noted in HPI.  Otherwise, a comprehensive ROS was negative.  Exam:   BP 110/70   Ht 5' 5.5" (1.664 m)  Wt 153 lb (69.4 kg)  BMI 25.06 kg/m2  LMP 11/07/1996 Height: 5' 5.5" (166.4 cm)  Ht Readings from Last 3 Encounters:  03/22/13 5' 5.5" (1.664 m)  03/08/13 5\' 7"  (1.702 m)  02/15/13 5\' 7"  (1.702 m)    General appearance: alert, cooperative and appears stated age Head: Normocephalic, without obvious abnormality, atraumatic Neck: no adenopathy, supple, symmetrical, trachea midline and thyroid normal to inspection and palpation Lungs: clear to auscultation bilaterally Breasts: normal appearance, no masses or tenderness Heart: regular rate and rhythm Abdomen: soft, non-tender; no masses,  no organomegaly Extremities: extremities normal, atraumatic, no cyanosis or edema Skin: Skin color, texture, turgor normal. No rashes or lesions Lymph nodes: Cervical, supraclavicular, and axillary nodes normal. No abnormal inguinal nodes palpated Neurologic: Grossly normal   Pelvic: External genitalia:  no lesions              Urethra:  normal appearing urethra with no masses, tenderness or lesions              Bartholin's and Skene's: normal  Vagina: normal appearing vagina with normal color and discharge, no lesions              Cervix: anteverted              Pap taken: no Bimanual Exam:  Uterus:  normal size, contour, position, consistency, mobility, non-tender              Adnexa: no mass, fullness, tenderness               Rectovaginal: Confirms               Anus:  normal sphincter tone, no lesions  A:  Well Woman with normal exam  Postmenopausal on HRT    P:   Pap smear as per guidelines   Mammogram due 4/15  Discussed continued therapy with HRT and patient wants to continue.    She is aware of potential side effects of HRT including  DVT, CVA, cancer, and etc  Refill HRT for another year    return annually or prn  An After Visit Summary was printed and given to the patient.

## 2013-03-22 NOTE — Patient Instructions (Addendum)

## 2013-03-25 ENCOUNTER — Ambulatory Visit: Payer: Self-pay | Admitting: Nurse Practitioner

## 2013-03-26 NOTE — Progress Notes (Signed)
Encounter reviewed by Dr. Ivery Nanney Silva.  

## 2013-04-02 ENCOUNTER — Encounter: Payer: Self-pay | Admitting: Internal Medicine

## 2013-09-12 ENCOUNTER — Other Ambulatory Visit: Payer: Self-pay

## 2013-11-07 DIAGNOSIS — C44300 Unspecified malignant neoplasm of skin of unspecified part of face: Secondary | ICD-10-CM

## 2013-11-07 HISTORY — DX: Unspecified malignant neoplasm of skin of unspecified part of face: C44.300

## 2014-01-14 ENCOUNTER — Telehealth: Payer: Self-pay | Admitting: Orthopedic Surgery

## 2014-01-14 NOTE — Telephone Encounter (Signed)
Initiated PA for estradiol. Case # [id number] started. Has gone to pharmacist for consultation. Decision should be made within 48 hours.

## 2014-03-28 ENCOUNTER — Ambulatory Visit (INDEPENDENT_AMBULATORY_CARE_PROVIDER_SITE_OTHER): Payer: Medicare Other | Admitting: Nurse Practitioner

## 2014-03-28 ENCOUNTER — Encounter: Payer: Self-pay | Admitting: Nurse Practitioner

## 2014-03-28 VITALS — BP 132/84 | HR 64 | Ht 66.0 in | Wt 146.0 lb

## 2014-03-28 DIAGNOSIS — Z780 Asymptomatic menopausal state: Secondary | ICD-10-CM

## 2014-03-28 DIAGNOSIS — N951 Menopausal and female climacteric states: Secondary | ICD-10-CM

## 2014-03-28 DIAGNOSIS — Z01419 Encounter for gynecological examination (general) (routine) without abnormal findings: Secondary | ICD-10-CM

## 2014-03-28 MED ORDER — MEDROXYPROGESTERONE ACETATE 2.5 MG PO TABS: ORAL_TABLET | ORAL | Status: DC

## 2014-03-28 MED ORDER — ESTRADIOL 0.5 MG PO TABS: ORAL_TABLET | ORAL | Status: DC

## 2014-03-28 NOTE — Progress Notes (Signed)
Patient ID: Jennifer [name], female   DOB: 07/28/1946, 68 y.o.   MRN: [id number] 67 y.o. [id number] Married Caucasian Fe here for annual exam.  Trying to taperiing off HRT for a year and now wil try off unless she has more vaso symptoms in the winter.  She looks great and is still very active. 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  Patient's last menstrual period was 11/07/1996.          Sexually active: no  The current method of family planning is post menopausal status.    Exercising: yes  pickle ball, golf, biking Smoker:  no  Health Maintenance: Pap: 01-28-10 neg  MMG: 03/28/2014, no results at time of visit Colonoscopy: 02/18/13 normal repeat in 10 years  BMD: 03/28/2014, no results at time of visit TDaP: 03/15/2011  Labs: done at PCP   reports that she quit smoking about 43 years ago. She has never used smokeless tobacco. She reports that she drinks about 4.2 ounces of alcohol per week. She reports that she does not use illicit drugs.  Past Medical History  Diagnosis Date  . DJD (degenerative joint disease)   . Atrial fibrillation 12/2010    PMH of ; [place] , [place] [facility]  . Cancer 2000    SKIN CANCER IN [facility]  . Blood transfusion during current hospitalization 9485    complication after childbirth    Past Surgical History  Procedure Laterality Date  . G 3 p 1    . Colonoscopy  02/2013    negative X 3; Dr Olevia Perches  . Cholecystectomy  1990's  . T&a  child  . Wisdom tooth extraction  teenager  . Shoulder surgery  2012    R ; Dr Durward Fortes for rotator tear    Current Outpatient Prescriptions  Medication Sig Dispense Refill  . estradiol (ESTRACE) 0.5 MG tablet 1/2 by mouth daily  90 tablet  0  . medroxyPROGESTERone (PROVERA) 2.5 MG tablet Take 1/2 tablet daily  90 tablet  0   No current facility-administered medications for this visit.    Family History  Problem Relation Age of Onset  . Atrial fibrillation Father    . Asthma Brother   . Colon cancer Neg Hx   . Rectal cancer Neg Hx   . Stomach cancer Neg Hx   . Alzheimer's disease Mother   . Myasthenia gravis Mother     ocular  . Breast cancer Mother   . Depression Daughter   . Breast cancer Paternal Grandmother     ROS:  Pertinent items are noted in HPI.  Otherwise, a comprehensive ROS was negative.  Exam:   BP 132/84  Pulse 64  Ht 5\' 6"  (1.676 m)  Wt 146 lb (66.225 kg)  BMI 23.58 kg/m2  LMP 11/07/1996 Height: 5\' 6"  (167.6 cm)  Ht Readings from Last 3 Encounters:  03/28/14 5\' 6"  (1.676 m)  03/22/13 5' 5.5" (1.664 m)  03/08/13 5\' 7"  (1.702 m)    General appearance: alert, cooperative and appears stated age Head: Normocephalic, without obvious abnormality, atraumatic Neck: no adenopathy, supple, symmetrical, trachea midline and thyroid normal to inspection and palpation Lungs: clear to auscultation bilaterally Breasts: normal appearance, no masses or tenderness Heart: regular rate and rhythm Abdomen: soft, non-tender; no masses,  no organomegaly Extremities: extremities normal, atraumatic, no cyanosis or edema Skin: Skin color, texture, turgor normal. No rashes or lesions Lymph nodes: Cervical, supraclavicular, and axillary nodes normal.  No abnormal inguinal nodes palpated Neurologic: Grossly normal   Pelvic: External genitalia:  no lesions              Urethra:  normal appearing urethra with no masses, tenderness or lesions              Bartholin's and Skene's: normal                 Vagina: normal appearing vagina with normal color and discharge, no lesions              Cervix: anteverted              Pap taken: no Bimanual Exam:  Uterus:  normal size, contour, position, consistency, mobility, non-tender              Adnexa: no mass, fullness, tenderness               Rectovaginal: Confirms               Anus:  normal sphincter tone, no lesions  A:  Well Woman with normal exam  Postmenopausal weaning off HRT- ready to  stop  P:   Reviewed health and wellness pertinent to exam  Pap smear not taken today  Mammogram is due 03/2015  Refill HRT for 1 RX only in case needed over the summer otherwise will be stopping now.  Counseled on breast self exam, mammography screening, use and side effects of HRT, adequate intake of calcium and vitamin D, diet and exercise return annually or prn  An After Visit Summary was printed and given to the patient.

## 2014-03-28 NOTE — Patient Instructions (Signed)

## 2014-04-04 NOTE — Progress Notes (Signed)
Encounter reviewed by Dr. Brook Silva.  

## 2014-04-09 ENCOUNTER — Telehealth: Payer: Self-pay | Admitting: *Deleted

## 2014-04-09 NOTE — Telephone Encounter (Signed)
Pt notified of results per written note on hardcopy.  Pt voices understanding of results.    Hardcopy of report sent to scan.

## 2014-06-07 HISTORY — PX: SHOULDER ARTHROSCOPY W/ ROTATOR CUFF REPAIR: SHX2400

## 2014-09-08 ENCOUNTER — Encounter: Payer: Self-pay | Admitting: Nurse Practitioner

## 2014-11-07 DIAGNOSIS — D472 Monoclonal gammopathy: Secondary | ICD-10-CM

## 2014-11-07 DIAGNOSIS — C9000 Multiple myeloma not having achieved remission: Secondary | ICD-10-CM

## 2014-11-07 HISTORY — DX: Multiple myeloma not having achieved remission: C90.00

## 2014-11-07 HISTORY — DX: Monoclonal gammopathy: D47.2

## 2015-03-24 LAB — HM MAMMOGRAPHY: HM Mammogram: NEGATIVE

## 2015-03-30 ENCOUNTER — Ambulatory Visit (INDEPENDENT_AMBULATORY_CARE_PROVIDER_SITE_OTHER): Payer: Medicare Other | Admitting: Nurse Practitioner

## 2015-03-30 ENCOUNTER — Encounter: Payer: Self-pay | Admitting: Nurse Practitioner

## 2015-03-30 VITALS — BP 110/66 | HR 72 | Ht 65.75 in | Wt 146.0 lb

## 2015-03-30 DIAGNOSIS — Z124 Encounter for screening for malignant neoplasm of cervix: Secondary | ICD-10-CM | POA: Diagnosis not present

## 2015-03-30 DIAGNOSIS — Z0000 Encounter for general adult medical examination without abnormal findings: Secondary | ICD-10-CM | POA: Diagnosis not present

## 2015-03-30 DIAGNOSIS — R8290 Unspecified abnormal findings in urine: Secondary | ICD-10-CM | POA: Diagnosis not present

## 2015-03-30 DIAGNOSIS — Z01419 Encounter for gynecological examination (general) (routine) without abnormal findings: Secondary | ICD-10-CM

## 2015-03-30 LAB — POCT URINALYSIS DIPSTICK
Bilirubin, UA: NEGATIVE
Glucose, UA: NEGATIVE
Ketones, UA: NEGATIVE
Nitrite, UA: NEGATIVE
Protein, UA: NEGATIVE
Urobilinogen, UA: NEGATIVE
pH, UA: 6

## 2015-03-30 NOTE — Patient Instructions (Addendum)

## 2015-03-30 NOTE — Progress Notes (Signed)
Patient ID: Jennifer [name], female   DOB: 08/08/1946, 69 y.o.   MRN: [id number] 69 y.o. [id number] Married  Caucasian Fe here for annual exam.  Tapered off HRT about a month a ago.  So far no symptoms.  No vaginal dryness.  Sleep is unchanged. She is OK to stay off HRT.   Will be playing in [employer] pickle ball tournament.  Patient's last menstrual period was 11/07/1996.          Sexually active: No.  The current method of family planning is post menopausal status.    Exercising: Yes.    Pickle Ball, golf and walking Smoker:  no  Health Maintenance: Pap:  01/28/10 MMG: 5/16 Colonoscopy:  2014 small amount of diverticula, recheck in 10 years BMD:   03/28/14 TDaP:  03/2011 Shingles:  09/18/2008 Labs: Dr. Linna Darner  urine moderate RBC and Trace leuk's   reports that she quit smoking about 44 years ago. She has never used smokeless tobacco. She reports that she drinks about 4.2 oz of alcohol per week. She reports that she does not use illicit drugs.  Past Medical History  Diagnosis Date  . DJD (degenerative joint disease)   . Atrial fibrillation 12/2010    PMH of ; [place] , [place] [facility]  . Cancer 2000    SKIN CANCER IN [facility]  . Blood transfusion during current hospitalization 5701    complication after childbirth    Past Surgical History  Procedure Laterality Date  . G 3 p 1    . Colonoscopy  02/2013    negative X 3; Dr Olevia Perches  . Cholecystectomy  1990's  . T&a  child  . Wisdom tooth extraction  teenager  . Shoulder surgery Right 2012    R ; Dr Durward Fortes for rotator tear  . Shoulder surgery Left 06/2014    Dr. Sydnee Cabal for rotator cuff    No current outpatient prescriptions on file.   No current facility-administered medications for this visit.    Family History  Problem Relation Age of Onset  . Atrial fibrillation Father   . Asthma Brother   . Colon cancer Neg Hx   . Rectal cancer Neg Hx   . Stomach cancer Neg Hx   . Alzheimer's disease Mother   . Myasthenia gravis Mother      ocular  . Breast cancer Mother   . Depression Daughter   . Breast cancer Paternal Grandmother     ROS:  Pertinent items are noted in HPI.  Otherwise, a comprehensive ROS was negative.  Exam:   BP 110/66 mmHg  Pulse 72  Ht 5' 5.75" (1.67 m)  Wt 146 lb (66.225 kg)  BMI 23.75 kg/m2  LMP 11/07/1996 Height: 5' 5.75" (167 cm) Ht Readings from Last 3 Encounters:  03/30/15 5' 5.75" (1.67 m)  03/28/14 5\' 6"  (1.676 m)  03/22/13 5' 5.5" (1.664 m)    General appearance: alert, cooperative and appears stated age Head: Normocephalic, without obvious abnormality, atraumatic Neck: no adenopathy, supple, symmetrical, trachea midline and thyroid normal to inspection and palpation Lungs: clear to auscultation bilaterally Breasts: normal appearance, no masses or tenderness Heart: regular rate and rhythm Abdomen: soft, non-tender; no masses,  no organomegaly Extremities: extremities normal, atraumatic, no cyanosis or edema Skin: Skin color, texture, turgor normal. No rashes or lesions Lymph nodes: Cervical, supraclavicular, and axillary nodes normal. No abnormal inguinal nodes palpated Neurologic: Grossly normal   Pelvic: External genitalia:  no lesions  Urethra:  normal appearing urethra with no masses, tenderness or lesions              Bartholin's and Skene's: normal                 Vagina: normal appearing vagina with normal color and discharge, no lesions              Cervix: anteverted, nabothian cyst is present              Pap taken: Yes.   Bimanual Exam:  Uterus:  normal size, contour, position, consistency, mobility, non-tender              Adnexa: no mass, fullness, tenderness               Rectovaginal: Confirms               Anus:  normal sphincter tone, no lesions  Chaperone present:  yes  A:  Well Woman with normal exam  Postmenopausal  - off HRT X 1 month   P:   Reviewed health and wellness pertinent to exam  Pap smear as above  Mammogram is due  5/17  Counseled on breast self exam, mammography screening, adequate intake of calcium and vitamin D, diet and exercise return annually or prn  An After Visit Summary was printed and given to the patient.

## 2015-03-31 ENCOUNTER — Ambulatory Visit: Payer: Medicare Other | Admitting: Nurse Practitioner

## 2015-03-31 LAB — URINALYSIS, MICROSCOPIC ONLY
Bacteria, UA: NONE SEEN
Casts: NONE SEEN
Crystals: NONE SEEN
Squamous Epithelial / LPF: NONE SEEN

## 2015-03-31 LAB — URINE CULTURE
Colony Count: NO GROWTH
Organism ID, Bacteria: NO GROWTH

## 2015-04-01 ENCOUNTER — Encounter: Payer: Self-pay | Admitting: Internal Medicine

## 2015-04-01 LAB — IPS PAP SMEAR ONLY

## 2015-04-03 ENCOUNTER — Encounter: Payer: Self-pay | Admitting: Internal Medicine

## 2015-04-03 ENCOUNTER — Ambulatory Visit (INDEPENDENT_AMBULATORY_CARE_PROVIDER_SITE_OTHER): Payer: Medicare Other | Admitting: Internal Medicine

## 2015-04-03 VITALS — BP 118/78 | HR 58 | Temp 97.9°F | Wt 150.1 lb

## 2015-04-03 DIAGNOSIS — R739 Hyperglycemia, unspecified: Secondary | ICD-10-CM | POA: Insufficient documentation

## 2015-04-03 DIAGNOSIS — K5730 Diverticulosis of large intestine without perforation or abscess without bleeding: Secondary | ICD-10-CM | POA: Diagnosis not present

## 2015-04-03 DIAGNOSIS — M2550 Pain in unspecified joint: Secondary | ICD-10-CM | POA: Diagnosis not present

## 2015-04-03 DIAGNOSIS — E785 Hyperlipidemia, unspecified: Secondary | ICD-10-CM

## 2015-04-03 MED ORDER — TRAMADOL HCL 50 MG PO TABS: 50.0000 mg | ORAL_TABLET | Freq: Three times a day (TID) | ORAL | Status: DC | PRN

## 2015-04-03 NOTE — Assessment & Plan Note (Addendum)
NMR Lipoprofile, LFT, TSH, CK 

## 2015-04-03 NOTE — Assessment & Plan Note (Signed)
Sedimentation rate RA factor CCP Uric acid Tramadol trial

## 2015-04-03 NOTE — Patient Instructions (Signed)
  Your next office appointment will be determined based upon review of your pending labs.  Those written interpretation of the lab results and instructions will be transmitted to you by My Chart   Critical results will be called.   Followup as needed for any active or acute issue. Please report any significant change in your symptoms.  Use an anti-inflammatory cream such as Aspercreme or Zostrix cream twice a day to the affected area as needed. In lieu of this warm moist compresses or  hot water bottle can be used. Do not apply ice except AFTER pickle ball

## 2015-04-03 NOTE — Assessment & Plan Note (Signed)
CBC

## 2015-04-03 NOTE — Assessment & Plan Note (Addendum)
A1c

## 2015-04-03 NOTE — Progress Notes (Signed)
   Subjective:    Patient ID: Jennifer [name], female    DOB: May 16, 1946, 69 y.o.   MRN: [id number]  HPI  She is here to assess active health issues & conditions. PMH, FH, & Social history verified & updated  She is on a heart healthy, low salt diet.She exercises 15 hours per week. Two drinks per night;smoked 1/4 ppd 1963-1972. Colonoscopy UTD;diverticulosis found 2014. No FH CVA,MI or DM. Prevnar needed.      Review of Systems Intermittent leg cramps. Arthralgias of ankles &hands for which she takes Advil 2 pills bid prn.  Chest pain, palpitations, tachycardia, exertional dyspnea, paroxysmal nocturnal dyspnea, claudication or edema are absent. No unexplained weight loss, abdominal pain, significant dyspepsia, dysphagia, melena, rectal bleeding, or persistently small caliber stools. Dysuria, pyuria, hematuria, frequency, nocturia or polyuria are denied. Change in hair, skin, nails denied. No bowel changes of constipation or diarrhea. No intolerance to heat or cold.      Objective:   Physical Exam  Pertinent or positive findings include:Pterygium OS medially. DIP DJD changes. Crepitus of knees.  General appearance :adequately nourished; in no distress. Eyes: No conjunctival inflammation or scleral icterus is present. Oral exam:  Lips and gums are healthy appearing.There is no oropharyngeal erythema or exudate noted. Dental hygiene is good. Heart:  Normal rate and regular rhythm. S1 and S2 normal without gallop, murmur, click, rub or other extra sounds   Lungs:Chest clear to auscultation; no wheezes, rhonchi,rales ,or rubs present.No increased work of breathing.  Abdomen: bowel sounds normal, soft and non-tender without masses, organomegaly or hernias noted.  No guarding or rebound. Vascular : all pulses equal ; no bruits present. Skin:Warm & dry.  Intact without suspicious lesions or rashes ; no tenting or jaundice  Lymphatic: No lymphadenopathy is noted about the head, neck,  axilla Neuro: Strength, tone & DTRs normal.         Assessment & Plan:  See Current Assessment & Plan in Problem List under specific Diagnosis

## 2015-04-03 NOTE — Progress Notes (Signed)
Pre visit review using our clinic review tool, if applicable. No additional management support is needed unless otherwise documented below in the visit note. 

## 2015-04-05 ENCOUNTER — Encounter: Payer: Self-pay | Admitting: Internal Medicine

## 2015-04-05 NOTE — Progress Notes (Signed)
Encounter reviewed by Dr. Josefa Half. Urine culture sent in.

## 2015-04-13 ENCOUNTER — Other Ambulatory Visit (INDEPENDENT_AMBULATORY_CARE_PROVIDER_SITE_OTHER): Payer: Medicare Other

## 2015-04-13 DIAGNOSIS — R739 Hyperglycemia, unspecified: Secondary | ICD-10-CM

## 2015-04-13 DIAGNOSIS — M2550 Pain in unspecified joint: Secondary | ICD-10-CM | POA: Diagnosis not present

## 2015-04-13 DIAGNOSIS — E785 Hyperlipidemia, unspecified: Secondary | ICD-10-CM | POA: Diagnosis not present

## 2015-04-13 DIAGNOSIS — K5730 Diverticulosis of large intestine without perforation or abscess without bleeding: Secondary | ICD-10-CM

## 2015-04-13 LAB — SEDIMENTATION RATE: Sed Rate: 60 mm/hr — ABNORMAL HIGH (ref 0–22)

## 2015-04-13 LAB — CBC WITH DIFFERENTIAL/PLATELET
Basophils Absolute: 0 10*3/uL (ref 0.0–0.1)
Basophils Relative: 0.9 % (ref 0.0–3.0)
Eosinophils Absolute: 0.1 10*3/uL (ref 0.0–0.7)
Eosinophils Relative: 1.9 % (ref 0.0–5.0)
HCT: 37.5 % (ref 36.0–46.0)
Hemoglobin: 12.9 g/dL (ref 12.0–15.0)
Lymphocytes Relative: 43.1 % (ref 12.0–46.0)
Lymphs Abs: 2.3 10*3/uL (ref 0.7–4.0)
MCHC: 34.5 g/dL (ref 30.0–36.0)
MCV: 93.4 fl (ref 78.0–100.0)
Monocytes Absolute: 0.3 10*3/uL (ref 0.1–1.0)
Monocytes Relative: 4.9 % (ref 3.0–12.0)
Neutro Abs: 2.6 10*3/uL (ref 1.4–7.7)
Neutrophils Relative %: 49.2 % (ref 43.0–77.0)
Platelets: 286 10*3/uL (ref 150.0–400.0)
RBC: 4.01 Mil/uL (ref 3.87–5.11)
RDW: 12.8 % (ref 11.5–15.5)
WBC: 5.3 10*3/uL (ref 4.0–10.5)

## 2015-04-13 LAB — BASIC METABOLIC PANEL
BUN: 20 mg/dL (ref 6–23)
CO2: 26 mEq/L (ref 19–32)
Calcium: 9.3 mg/dL (ref 8.4–10.5)
Chloride: 104 mEq/L (ref 96–112)
Creatinine, Ser: 0.98 mg/dL (ref 0.40–1.20)
GFR: 59.51 mL/min — ABNORMAL LOW (ref >60.00)
Glucose, Bld: 105 mg/dL — ABNORMAL HIGH (ref 70–99)
Potassium: 4.4 mEq/L (ref 3.5–5.1)
Sodium: 135 mEq/L (ref 135–145)

## 2015-04-13 LAB — HEPATIC FUNCTION PANEL
ALT: 23 U/L (ref 0–35)
AST: 22 U/L (ref 0–37)
Albumin: 4.1 g/dL (ref 3.5–5.2)
Alkaline Phosphatase: 50 U/L (ref 39–117)
Bilirubin, Direct: 0.1 mg/dL (ref 0.0–0.3)
Total Bilirubin: 0.6 mg/dL (ref 0.2–1.2)
Total Protein: 8.9 g/dL — ABNORMAL HIGH (ref 6.0–8.3)

## 2015-04-13 LAB — URIC ACID: Uric Acid, Serum: 5.2 mg/dL (ref 2.4–7.0)

## 2015-04-13 LAB — RHEUMATOID FACTOR: Rhuematoid fact SerPl-aCnc: <10 IU/mL (ref <=14)

## 2015-04-13 LAB — TSH: TSH: 1.97 u[IU]/mL (ref 0.35–4.50)

## 2015-04-13 LAB — HEMOGLOBIN A1C: Hgb A1c MFr Bld: 4.9 % (ref 4.6–6.5)

## 2015-04-13 LAB — CK: Total CK: 113 U/L (ref 7–177)

## 2015-04-14 ENCOUNTER — Other Ambulatory Visit: Payer: Self-pay | Admitting: Internal Medicine

## 2015-04-14 DIAGNOSIS — R778 Other specified abnormalities of plasma proteins: Secondary | ICD-10-CM

## 2015-04-14 DIAGNOSIS — R771 Abnormality of globulin: Secondary | ICD-10-CM | POA: Insufficient documentation

## 2015-04-15 LAB — NMR LIPOPROFILE WITH LIPIDS
Cholesterol, Total: 187 mg/dL (ref 100–199)
HDL Particle Number: 28.6 umol/L — ABNORMAL LOW (ref >=30.5)
HDL Size: 10.3 nm (ref >=9.2)
HDL-C: 73 mg/dL (ref >39)
LDL (calc): 102 mg/dL — ABNORMAL HIGH (ref 0–99)
LDL Particle Number: 951 nmol/L (ref <1000)
LDL Size: 21.5 nm (ref >=20.8)
LP-IR Score: <25 (ref <=45)
Large HDL-P: 16.4 umol/L (ref >=4.8)
Large VLDL-P: 2.7 nmol/L (ref <=2.7)
Small LDL Particle Number: <90 nmol/L (ref <=527)
Triglycerides: 62 mg/dL (ref 0–149)
VLDL Size: 45.5 nm (ref <=46.6)

## 2015-04-15 LAB — CYCLIC CITRUL PEPTIDE ANTIBODY, IGG: Cyclic Citrullin Peptide Ab: <2 U/mL (ref 0.0–5.0)

## 2015-06-14 ENCOUNTER — Other Ambulatory Visit: Payer: Self-pay | Admitting: Internal Medicine

## 2015-06-14 DIAGNOSIS — R778 Other specified abnormalities of plasma proteins: Secondary | ICD-10-CM

## 2015-06-15 ENCOUNTER — Other Ambulatory Visit: Payer: Medicare Other

## 2015-06-15 DIAGNOSIS — R778 Other specified abnormalities of plasma proteins: Secondary | ICD-10-CM

## 2015-06-17 LAB — PROTEIN ELECTROPHORESIS, SERUM, WITH REFLEX
Abnormal Protein Band1: 2.6 g/dL
Albumin ELP: 4.1 g/dL (ref 3.8–4.8)
Alpha-1-Globulin: 0.3 g/dL (ref 0.2–0.3)
Alpha-2-Globulin: 0.6 g/dL (ref 0.5–0.9)
Beta 2: 0.2 g/dL (ref 0.2–0.5)
Beta Globulin: 0.5 g/dL (ref 0.4–0.6)
Gamma Globulin: 2.8 g/dL — ABNORMAL HIGH (ref 0.8–1.7)
Total Protein, Serum Electrophoresis: 8.5 g/dL — ABNORMAL HIGH (ref 6.1–8.1)

## 2015-06-17 LAB — IGG, IGA, IGM
IgA: 22 mg/dL — ABNORMAL LOW (ref 69–380)
IgG (Immunoglobin G), Serum: 2580 mg/dL — ABNORMAL HIGH (ref 690–1700)
IgM, Serum: 5 mg/dL — ABNORMAL LOW (ref 52–322)

## 2015-06-18 LAB — IFE INTERPRETATION

## 2015-06-27 ENCOUNTER — Other Ambulatory Visit: Payer: Self-pay | Admitting: Internal Medicine

## 2015-06-27 DIAGNOSIS — R771 Abnormality of globulin: Secondary | ICD-10-CM

## 2015-07-06 ENCOUNTER — Ambulatory Visit: Payer: Medicare Other

## 2015-07-06 ENCOUNTER — Encounter: Payer: Self-pay | Admitting: Hematology

## 2015-07-06 ENCOUNTER — Telehealth: Payer: Self-pay | Admitting: Hematology

## 2015-07-06 ENCOUNTER — Ambulatory Visit (HOSPITAL_BASED_OUTPATIENT_CLINIC_OR_DEPARTMENT_OTHER): Payer: Medicare Other

## 2015-07-06 ENCOUNTER — Ambulatory Visit (HOSPITAL_BASED_OUTPATIENT_CLINIC_OR_DEPARTMENT_OTHER): Payer: Medicare Other | Admitting: Hematology

## 2015-07-06 VITALS — BP 163/68 | HR 61 | Temp 98.0°F | Resp 18 | Ht 65.75 in | Wt 144.6 lb

## 2015-07-06 DIAGNOSIS — R5383 Other fatigue: Secondary | ICD-10-CM

## 2015-07-06 DIAGNOSIS — R202 Paresthesia of skin: Secondary | ICD-10-CM

## 2015-07-06 DIAGNOSIS — D472 Monoclonal gammopathy: Secondary | ICD-10-CM

## 2015-07-06 DIAGNOSIS — D892 Hypergammaglobulinemia, unspecified: Secondary | ICD-10-CM

## 2015-07-06 LAB — COMPREHENSIVE METABOLIC PANEL (CC13)
ALT: 22 U/L (ref 0–55)
AST: 18 U/L (ref 5–34)
Albumin: 3.9 g/dL (ref 3.5–5.0)
Alkaline Phosphatase: 59 U/L (ref 40–150)
Anion Gap: 8 mEq/L (ref 3–11)
BUN: 18.2 mg/dL (ref 7.0–26.0)
CO2: 25 mEq/L (ref 22–29)
Calcium: 9.5 mg/dL (ref 8.4–10.4)
Chloride: 107 mEq/L (ref 98–109)
Creatinine: 0.8 mg/dL (ref 0.6–1.1)
EGFR: 71 mL/min/{1.73_m2} — ABNORMAL LOW (ref >90)
Glucose: 95 mg/dl (ref 70–140)
Potassium: 4.1 mEq/L (ref 3.5–5.1)
Sodium: 140 mEq/L (ref 136–145)
Total Bilirubin: 0.58 mg/dL (ref 0.20–1.20)
Total Protein: 9.1 g/dL — ABNORMAL HIGH (ref 6.4–8.3)

## 2015-07-06 LAB — CBC & DIFF AND RETIC
BASO%: 0.5 % (ref 0.0–2.0)
Basophils Absolute: 0 10*3/uL (ref 0.0–0.1)
EOS%: 0.6 % (ref 0.0–7.0)
Eosinophils Absolute: 0 10*3/uL (ref 0.0–0.5)
HCT: 38.5 % (ref 34.8–46.6)
HGB: 13.4 g/dL (ref 11.6–15.9)
Immature Retic Fract: 1.7 % (ref 1.60–10.00)
LYMPH%: 36.8 % (ref 14.0–49.7)
MCH: 32.4 pg (ref 25.1–34.0)
MCHC: 34.8 g/dL (ref 31.5–36.0)
MCV: 93.2 fL (ref 79.5–101.0)
MONO#: 0.2 10*3/uL (ref 0.1–0.9)
MONO%: 3.1 % (ref 0.0–14.0)
NEUT#: 3.8 10*3/uL (ref 1.5–6.5)
NEUT%: 59 % (ref 38.4–76.8)
Platelets: 258 10*3/uL (ref 145–400)
RBC: 4.13 10*6/uL (ref 3.70–5.45)
RDW: 12.4 % (ref 11.2–14.5)
Retic %: 1.5 % (ref 0.70–2.10)
Retic Ct Abs: 61.95 10*3/uL (ref 33.70–90.70)
WBC: 6.5 10*3/uL (ref 3.9–10.3)
lymph#: 2.4 10*3/uL (ref 0.9–3.3)

## 2015-07-06 LAB — LACTATE DEHYDROGENASE (CC13): LDH: 179 U/L (ref 125–245)

## 2015-07-06 NOTE — Progress Notes (Signed)
Checked in new pt with no financial concerns.  Pt has my card for any billing questions or concerns. ° °

## 2015-07-06 NOTE — Progress Notes (Signed)
Marland Kitchen    HEMATOLOGY/ONCOLOGY CONSULTATION NOTE  Date of Service: 07/06/2015  Patient Care Team: Hendricks Limes, MD as PCP - General  CHIEF COMPLAINTS/PURPOSE OF CONSULTATION:  Monoclonal paraproteinemia of undetermined significance.  HISTORY OF PRESENTING ILLNESS:   Jennifer [name] is a wonderful 69 y.o. female who has been referred to [name] by Dr. .Unice Cobble, MD  for evaluation and management of all for newly noted monoclonal paraproteinemia.  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 She notes that she has been having some bilateral ankle pain for the last 3-4 months as well as some other joint pains. She also notes some bilateral shoulder pains which she attributes to her bilateral previous rotator cuff surgeries and shoulder distress from her pickle ball and golf games . She reports that she has some intermittent mild tingling in her feet and a sense of imbalance when she first gets up from sitting position. No overt dizziness or lightheadedness. She also notes that she has had some increased sense of fatigue characterized by a desire to take a few short naps in the afternoon which has been unusual for her and she does not know what to make these.  Her primary care physician sent out and SPEP for evaluation which showed an M protein of 2.6 g/dL out of a total gamma globulin fraction of 2.8. IFE characterizes this as a monoclonal IgG lambda protein. Her quantitative immunoglobulin levels showed an elevated IgG of 2580, low IgA of 22 and low IgM of 5.  She is here for further workup of her newly noted monoclonal gammopathy.   She notes some nonlocalized discomfort over her back which she attributes to playing golf and pickle ball. No other overt focal bone pain.  Her labs from 04/13/2015 showed normal kidney function with a creatinine of 0.98, normal calcium of 9.3 and  normal blood counts with this CBC showing a hemoglobin of 12.9 with an MCV of 93.4, platelets 286 and WBC count of 5.3.  She notes that her urine looks somewhat darker but is not foamy. No other acute new focal symptoms.  We discussed the presence of this abnormal protein in her blood and what it might mean. We discussed the fact that her M protein was significant enough to warrant a detailed workup to rule out multiple myeloma or an alternative plasma cell dyscrasia. She is keen to complete the workup and know exactly what the current status of this is.  No concern with excessive bruising or bleeding. No issues with new infections area.  MEDICAL HISTORY:  Past Medical History  Diagnosis Date  . DJD (degenerative joint disease)   . Atrial fibrillation 12/2010    PMH of ; [place] , [place] [facility]  . Cancer 2000    SKIN CANCER IN SCALP  . Blood transfusion during current hospitalization 8099    complication after childbirth  . Skin cancer of face 2015    Patient reports this was likely basal cell on the right side of her face needing 14 stitches.   . Patient Active Problem List   Diagnosis Date Noted  . Abnormal gamma globulin level 04/14/2015  . Hyperlipidemia 04/03/2015  . Hyperglycemia 04/03/2015  . Diverticulosis of colon without hemorrhage 04/03/2015  . Arthralgia of multiple joints 04/03/2015  . DEGENERATIVE JOINT DISEASE 01/20/2010  . SKIN CANCER, HX OF 01/20/2010    SURGICAL HISTORY: Past Surgical  History  Procedure Laterality Date  . G 3 p 1    . Colonoscopy  02/2013    negative X 3; Dr Olevia Perches  . Cholecystectomy  1990's  . T&a  child  . Wisdom tooth extraction  teenager  . Shoulder surgery Right 2012    R ; Dr Durward Fortes for rotator tear  . Shoulder surgery Left 06/2014    Dr. Sydnee Cabal for rotator cuff    SOCIAL HISTORY: Social History   Social History  . Marital Status: Married    Spouse Name: N/A  . Number of Children: N/A  . Years of Education: N/A    Occupational History  . Not on file.   Social History Main Topics  . Smoking status: Former Smoker -- 0.25 packs/day for 3 years    Quit date: 11/07/1970  . Smokeless tobacco: Never Used     Comment: 1/4 ppd [id number]  . Alcohol Use: Yes     Comment: Wine 14 glasses/week  . Drug Use: No  . Sexual Activity: No   Other Topics Concern  . Not on file   Social History Narrative    FAMILY HISTORY: Family History  Problem Relation Age of Onset  . Atrial fibrillation Father   . Asthma Brother   . Colon cancer Neg Hx   . Rectal cancer Neg Hx   . Stomach cancer Neg Hx   . Diabetes Neg Hx   . Stroke Neg Hx   . Heart attack Neg Hx   . Alzheimer's disease Mother   . Myasthenia gravis Mother     ocular  . Depression Daughter   . Breast cancer Paternal Grandmother     ALLERGIES:  has No Known Allergies.  MEDICATIONS:  Current Outpatient Prescriptions  Medication Sig Dispense Refill  . ibuprofen (ADVIL,MOTRIN) 100 MG tablet Take 100 mg by mouth every 6 (six) hours as needed.    . traMADol (ULTRAM) 50 MG tablet Take 1 tablet (50 mg total) by mouth every 8 (eight) hours as needed. 30 tablet 2   No current facility-administered medications for this visit.    REVIEW OF SYSTEMS:    10 Point review of Systems was done is negative except as noted above.  PHYSICAL EXAMINATION: ECOG PERFORMANCE STATUS: 0-1  . Filed Vitals:   07/06/15 1109  Height: 5' 5.75" (1.67 m)  Weight: 144 lb 9.6 oz (65.59 kg)   Filed Weights   07/06/15 1109  Weight: 144 lb 9.6 oz (65.59 kg)   .Body mass index is 23.52 kg/(m^2).  GENERAL:alert, in no acute distress and comfortable SKIN: skin color, texture, turgor are normal, no rashes or significant lesions EYES: normal, conjunctiva are pink and non-injected, sclera clear OROPHARYNX:no exudate, no erythema and lips, buccal mucosa, and tongue normal  NECK: supple, no JVD, thyroid normal size, non-tender, without nodularity LYMPH:  no palpable  lymphadenopathy in the cervical, axillary or inguinal LUNGS: clear to auscultation with normal respiratory effort HEART: regular rate & rhythm,  no murmurs and no lower extremity edema ABDOMEN: abdomen soft, non-tender, normoactive bowel sounds  Musculoskeletal: no cyanosis of digits and no clubbing  PSYCH: alert & oriented x 3 with fluent speech NEURO: no focal motor/sensory deficits  LABORATORY DATA:  I have reviewed the data as listed  . CBC Latest Ref Rng 04/13/2015 10/12/2012  WBC 4.0 - 10.5 K/uL 5.3 7.4  Hemoglobin 12.0 - 15.0 g/dL 12.9 13.2  Hematocrit 36.0 - 46.0 % 37.5 38.6  Platelets 150.0 - 400.0 K/uL 286.0 255    .  CMP Latest Ref Rng 04/13/2015 10/12/2012  Glucose 70 - 99 mg/dL 105(H) 115(H)  BUN 6 - 23 mg/dL 20 19  Creatinine 0.40 - 1.20 mg/dL 0.98 0.85  Sodium 135 - 145 mEq/L 135 136  Potassium 3.5 - 5.1 mEq/L 4.4 4.2  Chloride 96 - 112 mEq/L 104 103  CO2 19 - 32 mEq/L 26 25  Calcium 8.4 - 10.5 mg/dL 9.3 9.7  Total Protein 6.0 - 8.3 g/dL 8.9(H) -  Total Bilirubin 0.2 - 1.2 mg/dL 0.6 -  Alkaline Phos 39 - 117 U/L 50 -  AST 0 - 37 U/L 22 -  ALT 0 - 35 U/L 23 -     RADIOGRAPHIC STUDIES: I have personally reviewed the radiological images as listed and agreed with the findings in the report. No results found.  ASSESSMENT & PLAN:   69 year old Caucasian female in good overall health with  #1 High risk monoclonal gammopathy of undetermined significance. Rule out multiple myeloma. Patient had some mild increased fatigue with some arthralgias. SPEP shows M spike of 2.6 with IFE showing IgG lambda paraprotein. Associated decrease in IgA and IgM. Patient has some nonspecific back pain. Labs in June 2016 showed no anemia, no hypercalcemia and relatively normal kidney function. She does notice some imbalance and some minimal feet paresthesias intermittently. Plan -CBC, CMP, sedimentation rate -We'll get comprehensive multiple myeloma labs today including repeat SPEP with  IFE and quantitative immunoglobulins,   Kappa/lambda serum free light chains, beta 2 microglobulin, LDH, urine 24 hour protein, UPEP, IFE. - PET/CT scan to rule out bone lesions - CT-guided bone marrow aspiration and biopsy to workup for multiple myeloma. - The PET CT scan and CT-guided biopsy were both requested to be done on the same day on 07/15/2015 as per patient   preference since she travels from 2 hours away. -Will set up follow-up in 6 weeks around 08/17/2015 with a plan to schedule an earlier appointment if the results are concerning. 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  #2 history of blood transfusion in 1980 with paresthesias and MGUS. -HIV, hepatitis C, B12 and copper labs. -Workup for MGUS as noted above.   All off the patients questions were answered to her apparent satisfaction. The patient knows to call the clinic with any problems, questions or concerns.  I spent 40 minutes counseling the patient face to face. The total time spent in the appointment was 60 minutes and more than 50% was on counseling and direct patient cares.    Sullivan Lone MD [place] AAHIVMS [facility] Hematology/Oncology Physician [place]  (Office):       [phone] (Work cell):  [phone] (Fax):           [phone]  07/06/2015 11:30 AM

## 2015-07-06 NOTE — Patient Instructions (Addendum)
Monoclonal gammopathy of undetermined significance rule out multiple myeloma Plan -Complete myeloma labs today and 24-hour urine testing -Bone marrow aspiration and biopsy on 07/15/2015 -PET/CT scan on 07/15/2015 -Return to care with Dr. Irene Limbo in 6 weeks around 08/17/2015. We will call with results and set up an earlier appointment if results suggest the need for such. -Enjoy your pickle ball and golf tournaments.  MEDICAL HISTORY:  Past Medical History  Diagnosis Date  . DJD (degenerative joint disease)   . Atrial fibrillation 12/2010    PMH of ; [place] , [place] [facility]  . Cancer 2000    SKIN CANCER IN [place]ALP  . Blood transfusion during current hospitalization 4935    complication after childbirth    SURGICAL HISTORY: Past Surgical History  Procedure Laterality Date  . G 3 p 1    . Colonoscopy  02/2013    negative X 3; Dr Olevia Perches  . Cholecystectomy  1990's  . T&a  child  . Wisdom tooth extraction  teenager  . Shoulder surgery Right 2012    R ; Dr Durward Fortes for rotator tear  . Shoulder surgery Left 06/2014    Dr. Sydnee Cabal for rotator cuff

## 2015-07-06 NOTE — Telephone Encounter (Signed)
per pof to sch pt appt-gave pt copy of avs-sent pt back to lab-gave pt # to [facility] adv they would call to sch scan

## 2015-07-07 ENCOUNTER — Ambulatory Visit: Payer: Medicare Other | Admitting: Hematology

## 2015-07-09 LAB — KAPPA/LAMBDA LIGHT CHAINS
Kappa free light chain: 0.73 mg/dL (ref 0.33–1.94)
Kappa:Lambda Ratio: 0.66 (ref 0.26–1.65)
Lambda Free Lght Chn: 1.11 mg/dL (ref 0.57–2.63)

## 2015-07-09 LAB — SPEP & IFE WITH QIG
Abnormal Protein Band1: 2.6 g/dL
Albumin ELP: 4.2 g/dL (ref 3.8–4.8)
Alpha-1-Globulin: 0.3 g/dL (ref 0.2–0.3)
Alpha-2-Globulin: 0.7 g/dL (ref 0.5–0.9)
Beta 2: 0.2 g/dL (ref 0.2–0.5)
Beta Globulin: 0.5 g/dL (ref 0.4–0.6)
Gamma Globulin: 2.9 g/dL — ABNORMAL HIGH (ref 0.8–1.7)
IgA: 20 mg/dL — ABNORMAL LOW (ref 69–380)
IgG (Immunoglobin G), Serum: 2910 mg/dL — ABNORMAL HIGH (ref 690–1700)
IgM, Serum: <5 mg/dL — ABNORMAL LOW (ref 52–322)
Total Protein, Serum Electrophoresis: 8.6 g/dL — ABNORMAL HIGH (ref 6.1–8.1)

## 2015-07-09 LAB — HEPATITIS B SURFACE ANTIGEN: Hepatitis B Surface Ag: NEGATIVE

## 2015-07-09 LAB — HIV ANTIBODY (ROUTINE TESTING W REFLEX): HIV 1&2 Ab, 4th Generation: NONREACTIVE

## 2015-07-09 LAB — COPPER, SERUM: Copper: 114 ug/dL (ref 70–175)

## 2015-07-09 LAB — HEPATITIS C ANTIBODY: HCV Ab: NEGATIVE

## 2015-07-09 LAB — HEPATITIS B CORE ANTIBODY, TOTAL: Hep B Core Total Ab: NONREACTIVE

## 2015-07-09 LAB — VITAMIN B12: Vitamin B-12: 319 pg/mL (ref 211–911)

## 2015-07-09 LAB — CALCIUM, IONIZED: Calcium, Ion: 1.29 mmol/L (ref 1.12–1.32)

## 2015-07-09 LAB — SEDIMENTATION RATE: Sed Rate: 40 mm/hr — ABNORMAL HIGH (ref 0–30)

## 2015-07-14 ENCOUNTER — Encounter (HOSPITAL_COMMUNITY)
Admission: RE | Admit: 2015-07-14 | Discharge: 2015-07-14 | Disposition: A | Discharge status: Home / Self Care | Payer: Medicare Other | Source: Ambulatory Visit | Attending: Hematology | Admitting: Hematology

## 2015-07-14 ENCOUNTER — Telehealth: Payer: Self-pay | Admitting: Hematology

## 2015-07-14 ENCOUNTER — Ambulatory Visit: Payer: Self-pay | Admitting: Internal Medicine

## 2015-07-14 ENCOUNTER — Other Ambulatory Visit: Payer: Self-pay | Admitting: Hematology

## 2015-07-14 DIAGNOSIS — R5383 Other fatigue: Secondary | ICD-10-CM

## 2015-07-14 DIAGNOSIS — D472 Monoclonal gammopathy: Secondary | ICD-10-CM | POA: Insufficient documentation

## 2015-07-14 DIAGNOSIS — D892 Hypergammaglobulinemia, unspecified: Secondary | ICD-10-CM | POA: Diagnosis present

## 2015-07-14 LAB — GLUCOSE, CAPILLARY: Glucose-Capillary: 94 mg/dL (ref 65–99)

## 2015-07-14 NOTE — Telephone Encounter (Signed)
pt cld to r/s appt-gave pt r/s time & date °

## 2015-07-15 LAB — UPEP/TP, 24-HR URINE
Collection Interval: 24 hours
Total Protein, Urine: <4 mg/dL
Total Volume, Urine: 2025 mL

## 2015-07-15 LAB — UIFE/LIGHT CHAINS/TP QN, 24-HR UR
Albumin, U: DETECTED
Alpha 1, Urine: DETECTED — AB
Alpha 2, Urine: DETECTED — AB
Beta, Urine: DETECTED — AB
Gamma Globulin, Urine: DETECTED — AB
Time: 24 hours
Total Protein, Urine: <4 mg/dL — ABNORMAL LOW (ref 5–24)
Volume, Urine: 2025 mL

## 2015-07-15 LAB — 24 HR URINE,KAPPA/LAMBDA LIGHT CHAINS
24H Urine Volume: 2025 mL/24 h
Measured Kappa Chain: <0.4 mg/dL (ref <2.00)
Measured Lambda Chain: <0.4 mg/dL (ref <2.00)

## 2015-07-16 ENCOUNTER — Encounter: Payer: Self-pay | Admitting: Hematology

## 2015-07-16 ENCOUNTER — Other Ambulatory Visit: Payer: Self-pay | Admitting: Radiology

## 2015-07-17 ENCOUNTER — Ambulatory Visit (HOSPITAL_COMMUNITY)
Admission: RE | Admit: 2015-07-17 | Discharge: 2015-07-17 | Disposition: A | Discharge status: Home / Self Care | Payer: Medicare Other | Source: Ambulatory Visit | Attending: Hematology | Admitting: Hematology

## 2015-07-17 ENCOUNTER — Encounter (HOSPITAL_COMMUNITY): Payer: Self-pay

## 2015-07-17 DIAGNOSIS — D472 Monoclonal gammopathy: Secondary | ICD-10-CM | POA: Insufficient documentation

## 2015-07-17 DIAGNOSIS — R5383 Other fatigue: Secondary | ICD-10-CM | POA: Diagnosis present

## 2015-07-17 DIAGNOSIS — D892 Hypergammaglobulinemia, unspecified: Secondary | ICD-10-CM | POA: Insufficient documentation

## 2015-07-17 LAB — CBC WITH DIFFERENTIAL/PLATELET
Basophils Absolute: 0 10*3/uL (ref 0.0–0.1)
Basophils Relative: 1 % (ref 0–1)
Eosinophils Absolute: 0.1 10*3/uL (ref 0.0–0.7)
Eosinophils Relative: 2 % (ref 0–5)
HCT: 40.3 % (ref 36.0–46.0)
Hemoglobin: 13.9 g/dL (ref 12.0–15.0)
Lymphocytes Relative: 48 % — ABNORMAL HIGH (ref 12–46)
Lymphs Abs: 2.9 10*3/uL (ref 0.7–4.0)
MCH: 32.5 pg (ref 26.0–34.0)
MCHC: 34.5 g/dL (ref 30.0–36.0)
MCV: 94.2 fL (ref 78.0–100.0)
Monocytes Absolute: 0.3 10*3/uL (ref 0.1–1.0)
Monocytes Relative: 4 % (ref 3–12)
Neutro Abs: 2.8 10*3/uL (ref 1.7–7.7)
Neutrophils Relative %: 45 % (ref 43–77)
Platelets: 278 10*3/uL (ref 150–400)
RBC: 4.28 MIL/uL (ref 3.87–5.11)
RDW: 12.5 % (ref 11.5–15.5)
WBC: 6.1 10*3/uL (ref 4.0–10.5)

## 2015-07-17 LAB — PROTIME-INR
INR: 0.97 (ref 0.00–1.49)
Prothrombin Time: 13.1 seconds (ref 11.6–15.2)

## 2015-07-17 LAB — BONE MARROW EXAM

## 2015-07-17 MED ORDER — FENTANYL CITRATE (PF) 100 MCG/2ML IJ SOLN
INTRAMUSCULAR | Status: AC
  Filled 2015-07-17: qty 2

## 2015-07-17 MED ORDER — SODIUM CHLORIDE 0.9 % IV SOLN
INTRAVENOUS | Status: DC
  Administered 2015-07-17: 09:00:00 via INTRAVENOUS

## 2015-07-17 MED ORDER — MIDAZOLAM HCL 2 MG/2ML IJ SOLN
INTRAMUSCULAR | Status: AC
  Filled 2015-07-17: qty 2

## 2015-07-17 MED ORDER — FENTANYL CITRATE (PF) 100 MCG/2ML IJ SOLN
INTRAMUSCULAR | Status: AC | PRN
  Administered 2015-07-17: 50 ug via INTRAVENOUS

## 2015-07-17 MED ORDER — MIDAZOLAM HCL 2 MG/2ML IJ SOLN
INTRAMUSCULAR | Status: AC | PRN
  Administered 2015-07-17: 1 mg via INTRAVENOUS

## 2015-07-17 MED ORDER — HYDROCODONE-ACETAMINOPHEN 5-325 MG PO TABS
1.0000 | ORAL_TABLET | ORAL | Status: DC | PRN
  Filled 2015-07-17: qty 2

## 2015-07-17 NOTE — Discharge Instructions (Signed)
Conscious Sedation, Adult, Care After °Refer to this sheet in the next few weeks. These instructions provide you with information on caring for yourself after your procedure. Your health care provider may also give you more specific instructions. Your treatment has been planned according to current medical practices, but problems sometimes occur. Call your health care provider if you have any problems or questions after your procedure. °WHAT TO EXPECT AFTER THE PROCEDURE  °After your procedure: °· You may feel sleepy, clumsy, and have poor balance for several hours. °· Vomiting may occur if you eat too soon after the procedure. °HOME CARE INSTRUCTIONS °· Do not participate in any activities where you could become injured for at least 24 hours. Do not: °· Drive. °· Swim. °· Ride a bicycle. °· Operate heavy machinery. °· Cook. °· Use power tools. °· Climb ladders. °· Work from a high place. °· Do not make important decisions or sign legal documents until you are improved. °· If you vomit, drink water, juice, or soup when you can drink without vomiting. Make sure you have little or no nausea before eating solid foods. °· Only take over-the-counter or prescription medicines for pain, discomfort, or fever as directed by your health care provider. °· Make sure you and your family fully understand everything about the medicines given to you, including what side effects may occur. °· You should not drink alcohol, take sleeping pills, or take medicines that cause drowsiness for at least 24 hours. °· If you smoke, do not smoke without supervision. °· If you are feeling better, you may resume normal activities 24 hours after you were sedated. °· Keep all appointments with your health care provider. °SEEK MEDICAL CARE IF: °· Your skin is pale or bluish in color. °· You continue to feel nauseous or vomit. °· Your pain is getting worse and is not helped by medicine. °· You have bleeding or swelling. °· You are still sleepy or  feeling clumsy after 24 hours. °SEEK IMMEDIATE MEDICAL CARE IF: °· You develop a rash. °· You have difficulty breathing. °· You develop any type of allergic problem. °· You have a fever. °MAKE SURE YOU: °· Understand these instructions. °· Will watch your condition. °· Will get help right away if you are not doing well or get worse. °Document Released: 08/14/2013 Document Reviewed: 08/14/2013 °ExitCare® Patient Information ©2015 [employer]. This information is not intended to replace advice given to you by your health care provider. Make sure you discuss any questions you have with your health care provider. ° °Bone Marrow Aspiration, Bone Marrow Biopsy °Care After °Read the instructions outlined below and refer to this sheet in the next few weeks. These discharge instructions provide you with general information on caring for yourself after you leave the hospital. Your caregiver may also give you specific instructions. While your treatment has been planned according to the most current medical practices available, unavoidable complications occasionally occur. If you have any problems or questions after discharge, call your caregiver. °FINDING OUT THE RESULTS OF YOUR TEST °Not all test results are available during your visit. If your test results are not back during the visit, make an appointment with your caregiver to find out the results. Do not assume everything is normal if you have not heard from your caregiver or the medical facility. It is important for you to follow up on all of your test results.  °HOME CARE INSTRUCTIONS  °You have had sedation and may be sleepy or dizzy. Your thinking   may not be as clear as usual. For the next 24 hours: °· Only take over-the-counter or prescription medicines for pain, discomfort, and or fever as directed by your caregiver. °· Do not drink alcohol. °· Do not smoke. °· Do not drive. °· Do not make important legal decisions. °· Do not operate heavy machinery. °· Do not  care for small children by yourself. °· Keep your dressing clean and dry. You may replace dressing with a bandage after 24 hours. °· You may take a bath or shower after 24 hours. °· Use an ice pack for 20 minutes every 2 hours while awake for pain as needed. °SEEK MEDICAL CARE IF:  °· There is redness, swelling, or increasing pain at the biopsy site. °· There is pus coming from the biopsy site. °· There is drainage from a biopsy site lasting longer than one day. °· An unexplained oral temperature above 102° F (38.9° C) develops. °SEEK IMMEDIATE MEDICAL CARE IF:  °· You develop a rash. °· You have difficulty breathing. °· You develop any reaction or side effects to medications given. °Document Released: 05/13/2005 Document Revised: 01/16/2012 Document Reviewed: 10/21/2008 °ExitCare® Patient Information ©2015 [employer]. This information is not intended to replace advice given to you by your health care provider. Make sure you discuss any questions you have with your health care provider. ° °

## 2015-07-17 NOTE — Procedures (Signed)
CT-guided  R iliac bone marrow aspiration and core biopsy No complication No blood loss. See complete dictation in [facility] PACS  

## 2015-07-17 NOTE — H&P (Signed)
Chief Complaint: Patient was seen in consultation today for CT-guided bone marrow biopsy   Referring Physician(s): Kale,Gautam Kishore  History of Present Illness: Jennifer [name] is a 69 y.o. female with history of high risk monoclonal gammopathy of undetermined significance who presents today for CT-guided bone marrow biopsy to rule out multiple myeloma or other plasma cell dyscrasia.  Past Medical History  Diagnosis Date  . DJD (degenerative joint disease)   . Atrial fibrillation 12/2010    PMH of ; [place] , [place] [facility]  . Cancer 2000    SKIN CANCER IN [place]ALP  . Blood transfusion during current hospitalization 2811    complication after childbirth  . Skin cancer of face 2015    Patient reports this was likely basal cell on the right side of her face needing 14 stitches.    Past Surgical History  Procedure Laterality Date  . G 3 p 1    . Colonoscopy  02/2013    negative X 3; Dr Olevia Perches  . Cholecystectomy  1990's  . T&a  child  . Wisdom tooth extraction  teenager  . Shoulder surgery Right 2012    R ; Dr Durward Fortes for rotator tear  . Shoulder surgery Left 06/2014    Dr. Sydnee Cabal for rotator cuff    Allergies: Review of patient's allergies indicates no known allergies.  Medications: Prior to Admission medications   Medication Sig Start Date End Date Taking? Authorizing Provider  ibuprofen (ADVIL,MOTRIN) 100 MG tablet Take 100 mg by mouth every 6 (six) hours as needed for pain.    Yes Historical Provider, MD  traMADol (ULTRAM) 50 MG tablet Take 1 tablet (50 mg total) by mouth every 8 (eight) hours as needed. Patient not taking: Reported on 07/15/2015 04/03/15   Hendricks Limes, MD     Family History  Problem Relation Age of Onset  . Atrial fibrillation Father   . Asthma Brother   . Colon cancer Neg Hx   . Rectal cancer Neg Hx   . Stomach cancer Neg Hx   . Diabetes Neg Hx   . Stroke Neg Hx   . Heart attack Neg Hx   . Alzheimer's disease Mother   . Myasthenia  gravis Mother     ocular  . Depression Daughter   . Breast cancer Paternal Grandmother     Social History   Social History  . Marital Status: Married    Spouse Name: N/A  . Number of Children: N/A  . Years of Education: N/A   Social History Main Topics  . Smoking status: Former Smoker -- 0.25 packs/day for 3 years    Quit date: 11/07/1970  . Smokeless tobacco: Never Used     Comment: 1/4 ppd [id number]  . Alcohol Use: Yes     Comment: Wine 14 glasses/week  . Drug Use: No  . Sexual Activity: No   Other Topics Concern  . None   Social History Narrative      Review of Systems  Constitutional: Positive for fatigue. Negative for fever, chills and unexpected weight change.  Respiratory: Negative for cough and shortness of breath.   Cardiovascular: Negative for chest pain.  Gastrointestinal: Negative for nausea, vomiting, abdominal pain and blood in stool.  Genitourinary: Negative for dysuria and hematuria.  Musculoskeletal: Positive for arthralgias.       Occasional back pain  Neurological: Positive for headaches.       Occasional paresthesias in her feet, occasional balance difficulty  Vital Signs: BP 131/80 mmHg  Pulse 62  Temp(Src) 97.8 F (36.6 C) (Oral)  Resp 18  SpO2 100%  LMP 11/07/1996  Physical Exam  Constitutional: She is oriented to person, place, and time. She appears well-developed and well-nourished.  Cardiovascular: Normal rate and regular rhythm.   Pulmonary/Chest: Effort normal and breath sounds normal.  Abdominal: Soft. Bowel sounds are normal. There is no tenderness.  Musculoskeletal: Normal range of motion. She exhibits no edema.  Neurological: She is alert and oriented to person, place, and time.    Mallampati Score:     Imaging: Nm Pet Image Initial (pi) Skull Base To Thigh  07/14/2015   CLINICAL DATA:  Initial treatment strategy for multiple myeloma. Monoclonal gammopathy.  EXAM: NUCLEAR MEDICINE PET WHOLE BODY  TECHNIQUE: 7.2 mCi  F-18 FDG was injected intravenously. Full-ring PET imaging was performed from the vertex to the feet after the radiotracer. CT data was obtained and used for attenuation correction and anatomic localization.  FASTING BLOOD GLUCOSE:  Value:  94 mg/dl  COMPARISON:  None.  FINDINGS: HEAD/NECK  No intracranial hypermetabolism. A focus of hypermetabolism about the right supraclavicular fossa is in the region of small nodes and measures a S.U.V. max of 3.5 on image 65.  CHEST  No areas of abnormal hypermetabolism.  ABDOMEN/PELVIS  No areas of abnormal hypermetabolism.  SKELETON  No abnormal osseous hypermetabolism.  CT IMAGES PERFORMED FOR ATTENUATION CORRECTION  Carotid atherosclerosis bilaterally. Calcified granuloma in the left lower lobe. LAD coronary artery atherosclerosis. No thoracic adenopathy. Cholecystectomy. Old granulomatous disease in the spleen. No focal osseous lesion.  IMPRESSION: 1. No hypermetabolic osseous lesions to suggest multiple myeloma. No hypermetabolic adenopathy. 2. A focus of hypermetabolism about the right supraclavicular fossa is nonspecific. Small nodes are identified in this area. Considerations include reactive nodal hypermetabolism or focus of hypermetabolic "Brown" fat. Recommend attention on follow-up. 3.  Atherosclerosis, including within the coronary arteries.   Electronically Signed   By: Abigail Miyamoto M.D.   On: 07/14/2015 11:19    Labs:  CBC:  Recent Labs  04/13/15 0802 07/06/15 1300  WBC 5.3 6.5  HGB 12.9 13.4  HCT 37.5 38.5  PLT 286.0 258    COAGS: No results for input(s): INR, APTT in the last 8760 hours.  BMP:  Recent Labs  04/13/15 0802 07/06/15 1300  NA 135 140  K 4.4 4.1  CL 104  --   CO2 26 25  GLUCOSE 105* 95  BUN 20 18.2  CALCIUM 9.3 9.5  CREATININE 0.98 0.8    LIVER FUNCTION TESTS:  Recent Labs  04/13/15 0802 07/06/15 1300  BILITOT 0.6 0.58  AST 22 18  ALT 23 22  ALKPHOS 50 59  PROT 8.9* 9.1*  ALBUMIN 4.1 3.9    TUMOR  MARKERS: No results for input(s): AFPTM, CEA, CA199, CHROMGRNA in the last 8760 hours.  Assessment and Plan: Pt with history of high risk monoclonal gammopathy of undetermined significance who presents today for CT-guided bone marrow biopsy to rule out multiple myeloma or other plasma cell dyscrasia.Risks and benefits discussed with the patient/husband including, but not limited to bleeding, infection, damage to adjacent structures or low yield requiring additional tests.All of the patient's questions were answered, patient is agreeable to proceed.Consent signed and in chart.     Thank you for this interesting consult.  I greatly enjoyed meeting Jennifer [name] and look forward to participating in their care.  A copy of this report was sent to the requesting provider  on this date.  Signed: D. Rowe Robert 07/17/2015, 9:28 AM   I spent a total of 15 minutes in face to face in clinical consultation, greater than 50% of which was counseling/coordinating care for CT-guided bone marrow biopsy

## 2015-07-20 ENCOUNTER — Telehealth: Payer: Self-pay | Admitting: Hematology

## 2015-07-20 NOTE — Telephone Encounter (Signed)
per pt to r/s to 9/14-r/s and gave pt r/s time & date

## 2015-07-22 ENCOUNTER — Encounter: Payer: Self-pay | Admitting: Hematology

## 2015-07-22 ENCOUNTER — Ambulatory Visit (HOSPITAL_BASED_OUTPATIENT_CLINIC_OR_DEPARTMENT_OTHER): Payer: Medicare Other | Admitting: Hematology

## 2015-07-22 VITALS — BP 135/64 | HR 66 | Temp 97.5°F | Resp 17 | Ht 65.75 in | Wt 144.8 lb

## 2015-07-22 DIAGNOSIS — C9000 Multiple myeloma not having achieved remission: Secondary | ICD-10-CM | POA: Diagnosis present

## 2015-07-22 DIAGNOSIS — D472 Monoclonal gammopathy: Secondary | ICD-10-CM

## 2015-07-27 LAB — CHROMOSOME ANALYSIS, BONE MARROW

## 2015-07-28 ENCOUNTER — Ambulatory Visit: Payer: Medicare Other | Admitting: Hematology

## 2015-07-29 ENCOUNTER — Ambulatory Visit: Payer: Medicare Other | Admitting: Hematology

## 2015-07-30 NOTE — Progress Notes (Signed)
Jennifer [name]    HEMATOLOGY/ONCOLOGY CONSULTATION NOTE  Date of Service: 07/22/2015 Patient Care Team: Hendricks Limes, MD as PCP - General  CHIEF COMPLAINTS/PURPOSE OF CONSULTATION:  Monoclonal paraproteinemia of undetermined significance.  HISTORY OF PRESENTING ILLNESS: please see my initial consultation for details of her initial presentation  INTERVAL HISTORY  Jennifer [name] is here for followup to discuss the results of her bone marrow biopsy and PET/CT scan.  She had a good pickle ball tournament. Tolerated bone marrow biopsy well.  No other acute new symptoms or concerns.  Her husband accompanies her today for discussion of results.  Her PET/CT scan showed no concerning bone lesions.  The bone marrow biopsy however showed 35% clonal plasma cells. M spike just under 3 g.  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  We had a detailed discussion of this diagnosis, prognosis, treatment options and appropriate followup. All of her and her husband's questions were answered in detail.   MEDICAL HISTORY:  Past Medical History  Diagnosis Date  . DJD (degenerative joint disease)   . Atrial fibrillation 12/2010    PMH of ; [place] , [place] [facility]  . Cancer 2000    SKIN CANCER IN SCALP  . Blood transfusion during current hospitalization 3220    complication after childbirth  . Skin cancer of face 2015    Patient reports this was likely basal cell on the right side of her face needing 14 stitches.   . Patient Active Problem List   Diagnosis Date Noted  . Hypergammaglobulinemia   . IgG monoclonal gammopathy of uncertain significance   . Abnormal gamma globulin level 04/14/2015  . Hyperlipidemia 04/03/2015  . Hyperglycemia 04/03/2015  . Diverticulosis of colon without hemorrhage 04/03/2015  . Arthralgia of multiple joints 04/03/2015  . DEGENERATIVE JOINT DISEASE 01/20/2010  . SKIN CANCER, HX OF 01/20/2010    SURGICAL HISTORY: Past Surgical History  Procedure  Laterality Date  . G 3 p 1    . Colonoscopy  02/2013    negative X 3; Dr Olevia Perches  . Cholecystectomy  1990's  . T&a  child  . Wisdom tooth extraction  teenager  . Shoulder surgery Right 2012    R ; Dr Durward Fortes for rotator tear  . Shoulder surgery Left 06/2014    Dr. Sydnee Cabal for rotator cuff    SOCIAL HISTORY: Social History   Social History  . Marital Status: Married    Spouse Name: N/A  . Number of Children: N/A  . Years of Education: N/A   Occupational History  . Not on file.   Social History Main Topics  . Smoking status: Former Smoker -- 0.25 packs/day for 3 years    Quit date: 11/07/1970  . Smokeless tobacco: Never Used     Comment: 1/4 ppd [id number]  . Alcohol Use: Yes     Comment: Wine 14 glasses/week  . Drug Use: No  . Sexual Activity: No   Other Topics Concern  . Not on file   Social History Narrative    FAMILY HISTORY: Family History  Problem Relation Age of Onset  . Atrial fibrillation Father   . Asthma Brother   . Colon cancer Neg Hx   . Rectal cancer Neg Hx   . Stomach cancer Neg Hx   . Diabetes Neg Hx   . Stroke Neg Hx   . Heart attack Neg Hx   . Alzheimer's disease Mother   . Myasthenia gravis Mother  ocular  . Depression Daughter   . Breast cancer Paternal Grandmother     ALLERGIES:  has No Known Allergies.  MEDICATIONS:  Current Outpatient Prescriptions  Medication Sig Dispense Refill  . ibuprofen (ADVIL,MOTRIN) 100 MG tablet Take 100 mg by mouth every 6 (six) hours as needed for pain.     . traMADol (ULTRAM) 50 MG tablet Take 1 tablet (50 mg total) by mouth every 8 (eight) hours as needed. (Patient not taking: Reported on 07/15/2015) 30 tablet 2   No current facility-administered medications for this visit.    REVIEW OF SYSTEMS:    10 Point review of Systems was done is negative except as noted above.  PHYSICAL EXAMINATION: ECOG PERFORMANCE STATUS: 0-1  . Filed Vitals:   07/22/15 0934  Height: 5' 5.75" (1.67 m)  Weight:  144 lb 12.8 oz (65.681 kg)   Filed Weights   07/22/15 0934  Weight: 144 lb 12.8 oz (65.681 kg)   .Body mass index is 23.55 kg/(m^2).  GENERAL:alert, in no acute distress and comfortable SKIN: skin color, texture, turgor are normal, no rashes or significant lesions EYES: normal, conjunctiva are pink and non-injected, sclera clear OROPHARYNX:no exudate, no erythema and lips, buccal mucosa, and tongue normal  NECK: supple, no JVD, thyroid normal size, non-tender, without nodularity LYMPH:  no palpable lymphadenopathy in the cervical, axillary or inguinal LUNGS: clear to auscultation with normal respiratory effort HEART: regular rate & rhythm,  no murmurs and no lower extremity edema ABDOMEN: abdomen soft, non-tender, normoactive bowel sounds  Musculoskeletal: no cyanosis of digits and no clubbing  PSYCH: alert & oriented x 3 with fluent speech NEURO: no focal motor/sensory deficits  LABORATORY DATA:  I have reviewed the data as listed  . CBC Latest Ref Rng 07/17/2015 07/06/2015 04/13/2015  WBC 4.0 - 10.5 K/uL 6.1 6.5 5.3  Hemoglobin 12.0 - 15.0 g/dL 13.9 13.4 12.9  Hematocrit 36.0 - 46.0 % 40.3 38.5 37.5  Platelets 150 - 400 K/uL 278 258 286.0    . CMP Latest Ref Rng 07/06/2015 04/13/2015 10/12/2012  Glucose 70 - 140 mg/dl 95 105(H) 115(H)  BUN 7.0 - 26.0 mg/dL 18.$RemoveBefor'2 20 19  'BQSKaQaYJKCf$ Creatinine 0.6 - 1.1 mg/dL 0.8 0.98 0.85  Sodium 136 - 145 mEq/L 140 135 136  Potassium 3.5 - 5.1 mEq/L 4.1 4.4 4.2  Chloride 96 - 112 mEq/L - 104 103  CO2 22 - 29 mEq/L $Remove'25 26 25  'esgFgBd$ Calcium 8.4 - 10.4 mg/dL 9.5 9.3 9.7  Total Protein 6.4 - 8.3 g/dL 9.1(H) 8.9(H) -  Total Bilirubin 0.20 - 1.20 mg/dL 0.58 0.6 -  Alkaline Phos 40 - 150 U/L 59 50 -  AST 5 - 34 U/L 18 22 -  ALT 0 - 55 U/L 22 23 -   . Lab Results  Component Value Date   TOTALPROTELP 8.6* 07/06/2015   ALBUMINELP 4.2 07/06/2015   A1GS 0.3 07/06/2015   A2GS 0.7 07/06/2015   BETS 0.5 07/06/2015   BETA2SER 0.2 07/06/2015   GAMS 2.9* 07/06/2015    SPEI * 07/06/2015   (this displays SPEP labs)  Lab Results  Component Value Date   KPAFRELGTCHN 0.73 07/06/2015   LAMBDASER 1.11 07/06/2015   KAPLAMBRATIO 0.66 07/06/2015   (kappa/lambda light chains)    RADIOGRAPHIC STUDIES: I have personally reviewed the radiological images as listed and agreed with the findings in the report. Nm Pet Image Initial (pi) Skull Base To Thigh  07/14/2015   CLINICAL DATA:  Initial treatment strategy for multiple myeloma. Monoclonal  gammopathy.  EXAM: NUCLEAR MEDICINE PET WHOLE BODY  TECHNIQUE: 7.2 mCi F-18 FDG was injected intravenously. Full-ring PET imaging was performed from the vertex to the feet after the radiotracer. CT data was obtained and used for attenuation correction and anatomic localization.  FASTING BLOOD GLUCOSE:  Value:  94 mg/dl  COMPARISON:  None.  FINDINGS: HEAD/NECK  No intracranial hypermetabolism. A focus of hypermetabolism about the right supraclavicular fossa is in the region of small nodes and measures a S.U.V. max of 3.5 on image 65.  CHEST  No areas of abnormal hypermetabolism.  ABDOMEN/PELVIS  No areas of abnormal hypermetabolism.  SKELETON  No abnormal osseous hypermetabolism.  CT IMAGES PERFORMED FOR ATTENUATION CORRECTION  Carotid atherosclerosis bilaterally. Calcified granuloma in the left lower lobe. LAD coronary artery atherosclerosis. No thoracic adenopathy. Cholecystectomy. Old granulomatous disease in the spleen. No focal osseous lesion.  IMPRESSION: 1. No hypermetabolic osseous lesions to suggest multiple myeloma. No hypermetabolic adenopathy. 2. A focus of hypermetabolism about the right supraclavicular fossa is nonspecific. Small nodes are identified in this area. Considerations include reactive nodal hypermetabolism or focus of hypermetabolic "Brown" fat. Recommend attention on follow-up. 3.  Atherosclerosis, including within the coronary arteries.   Electronically Signed   By: Abigail Miyamoto M.D.   On: 07/14/2015 11:19   Ct  Biopsy  07/17/2015   CLINICAL DATA:  Multiple myeloma, back pain  EXAM: CT GUIDED DEEP ILIAC BONE ASPIRATION AND CORE BIOPSY  TECHNIQUE: The procedure, risks (including but not limited to bleeding, infection, organ damage ), benefits, and alternatives were explained to the patient. Questions regarding the procedure were encouraged and answered. The patient understands and consents to the procedure. Patient was placed supine on the CT gantry and limited axial scans through the pelvis were obtained. Appropriate skin entry site was identified. Skin site was marked, prepped with Betadine, draped in usual sterile fashion, and infiltrated locally with 1% lidocaine.  Intravenous Fentanyl and Versed were administered as conscious sedation during continuous cardiorespiratory monitoring by the radiology RN, with a total moderate sedation time of ten minutes.  Under CT fluoroscopic guidance an 11-gauge Cook trocar bone needle was advanced into the right iliac bone just lateral to the sacroiliac joint. Once needle tip position was confirmed, coaxial core and aspiration samples were obtained. The final sample was obtained using the guiding needle itself, which was then removed. Post procedure scans show no hematoma or fracture. Patient tolerated procedure well.  COMPLICATIONS: COMPLICATIONS none  IMPRESSION: 1. Technically successful CT guided right iliac bone core and aspiration biopsy.   Electronically Signed   By: Lucrezia Europe M.D.   On: 07/17/2015 12:58   Normal DEXA scan in June 2015     Cytogenetic analysis: Revealed the presence of a normal female chromosomes with no observable clonal chromosomal abnormalities.   ASSESSMENT & PLAN:   69 year old Caucasian female in good overall health with  #1Smoldering Multiple Myeloma IgG lambda. Bone marrow plasma cells more than 10% ( noted to have 35% clonal plasma cells] Normal female karyotype cytogenetics with no other abnormalities SPEP shows M spike of 2.6 with IFE  showing IgG lambda paraprotein. Associated with some Immunoparesis characterized by associated decrease in IgA and IgM. No evidence of anemia, renal failure, hypercalcemia and bone lesions.  A large retrospective study from the [facility] evaluated noted three risk factors with significant prognostic value: bone marrow plasma cells more than 10 percent; serum M protein more than 3 g/dL; and abnormal FLC ratio less than 0.125 or more  than 8. The probability of progression at five years was 62, 78, and 86 percent for patients who had one, two, or three of these risk factors, respectively.  Plan -based on [place] the patient has a 25%probability of progression needing treatment at 5 years.  That is about 5% a year. -We will follow her up at 2 months to repeat myeloma serologic markers to check for temporal change.if stable we can change her follow up every 3-4 months. -She currently does not meet high risk smoldering myeloma criteria to consider starting treatment with Rd. -no role for bisphosphonates at this time  #2 history of blood transfusion in 1980 with paresthesias  -HIV, hepatitis C neg, B12- low normal and copper-nl. -consider B12 replacement to maintain higher levels/B-complex vits  All off the patients questions were answered to her apparent satisfaction. The patient knows to call the clinic with any problems, questions or concerns.  I spent 20 minutes counseling the patient face to face. The total time spent in the appointment was 25 minutes and more than 50% was on counseling and direct patient cares.    Sullivan Lone MD [place] AAHIVMS [facility] Hematology/Oncology Physician [place]  (Office):       [phone] (Work cell):  [phone] (Fax):           [phone]

## 2015-07-31 ENCOUNTER — Encounter (HOSPITAL_COMMUNITY): Payer: Self-pay

## 2015-08-17 ENCOUNTER — Ambulatory Visit: Payer: Medicare Other | Admitting: Hematology

## 2015-08-19 ENCOUNTER — Encounter: Payer: Self-pay | Admitting: Hematology

## 2015-08-25 ENCOUNTER — Encounter: Payer: Self-pay | Admitting: Hematology

## 2015-08-27 ENCOUNTER — Encounter: Payer: Self-pay | Admitting: Hematology

## 2015-09-08 ENCOUNTER — Telehealth: Payer: Self-pay | Admitting: Hematology

## 2015-09-08 NOTE — Telephone Encounter (Signed)
pt cld to sch appt-gave pt time & date-per pof to sch lab 1 week prior. Pt states lives  2 hrs awayt and req to do labs/appt same day

## 2015-09-21 ENCOUNTER — Encounter: Payer: Self-pay | Admitting: Hematology

## 2015-09-21 ENCOUNTER — Ambulatory Visit (HOSPITAL_BASED_OUTPATIENT_CLINIC_OR_DEPARTMENT_OTHER): Payer: Medicare Other

## 2015-09-21 ENCOUNTER — Ambulatory Visit (HOSPITAL_BASED_OUTPATIENT_CLINIC_OR_DEPARTMENT_OTHER): Payer: Medicare Other | Admitting: Hematology

## 2015-09-21 ENCOUNTER — Telehealth: Payer: Self-pay | Admitting: Hematology

## 2015-09-21 VITALS — BP 145/71 | HR 66 | Temp 97.5°F | Resp 16 | Ht 65.75 in | Wt 145.9 lb

## 2015-09-21 DIAGNOSIS — D472 Monoclonal gammopathy: Secondary | ICD-10-CM

## 2015-09-21 DIAGNOSIS — C9000 Multiple myeloma not having achieved remission: Secondary | ICD-10-CM

## 2015-09-21 LAB — COMPREHENSIVE METABOLIC PANEL (CC13)
ALT: 19 U/L (ref 0–55)
AST: 19 U/L (ref 5–34)
Albumin: 3.6 g/dL (ref 3.5–5.0)
Alkaline Phosphatase: 56 U/L (ref 40–150)
Anion Gap: 11 mEq/L (ref 3–11)
BUN: 18.7 mg/dL (ref 7.0–26.0)
CO2: 23 mEq/L (ref 22–29)
Calcium: 9.3 mg/dL (ref 8.4–10.4)
Chloride: 106 mEq/L (ref 98–109)
Creatinine: 0.9 mg/dL (ref 0.6–1.1)
EGFR: 62 mL/min/{1.73_m2} — ABNORMAL LOW (ref >90)
Glucose: 122 mg/dl (ref 70–140)
Potassium: 4.1 mEq/L (ref 3.5–5.1)
Sodium: 139 mEq/L (ref 136–145)
Total Bilirubin: 0.62 mg/dL (ref 0.20–1.20)
Total Protein: 8.6 g/dL — ABNORMAL HIGH (ref 6.4–8.3)

## 2015-09-21 LAB — CBC & DIFF AND RETIC
BASO%: 0.3 % (ref 0.0–2.0)
Basophils Absolute: 0 10*3/uL (ref 0.0–0.1)
EOS%: 0.9 % (ref 0.0–7.0)
Eosinophils Absolute: 0.1 10*3/uL (ref 0.0–0.5)
HCT: 39.6 % (ref 34.8–46.6)
HGB: 13.5 g/dL (ref 11.6–15.9)
Immature Retic Fract: 2.3 % (ref 1.60–10.00)
LYMPH%: 31.9 % (ref 14.0–49.7)
MCH: 32.1 pg (ref 25.1–34.0)
MCHC: 34.1 g/dL (ref 31.5–36.0)
MCV: 94.3 fL (ref 79.5–101.0)
MONO#: 0.2 10*3/uL (ref 0.1–0.9)
MONO%: 3.1 % (ref 0.0–14.0)
NEUT#: 4.5 10*3/uL (ref 1.5–6.5)
NEUT%: 63.8 % (ref 38.4–76.8)
Platelets: 255 10*3/uL (ref 145–400)
RBC: 4.2 10*6/uL (ref 3.70–5.45)
RDW: 12.4 % (ref 11.2–14.5)
Retic %: 1.31 % (ref 0.70–2.10)
Retic Ct Abs: 55.02 10*3/uL (ref 33.70–90.70)
WBC: 7 10*3/uL (ref 3.9–10.3)
lymph#: 2.2 10*3/uL (ref 0.9–3.3)

## 2015-09-21 LAB — LACTATE DEHYDROGENASE (CC13): LDH: 162 U/L (ref 125–245)

## 2015-09-21 NOTE — Telephone Encounter (Signed)
per pof to sch pt appt-gave pt copy of avs °

## 2015-09-21 NOTE — Progress Notes (Signed)
Marland Kitchen    HEMATOLOGY/ONCOLOGY CONSULTATION NOTE  Date of Service: .09/21/2015   Patient Care Team: Hendricks Limes, MD as PCP - General  CHIEF COMPLAINTS/PURPOSE OF CONSULTATION:  Follow up for smoldering multiple myeloma  Diagnosis: Smoldering multiple myeloma Treatment: Close monitoring  HISTORY OF PRESENTING ILLNESS: please see my initial consultation for details of her initial presentation  INTERVAL HISTORY  Jennifer [name] is here for her scheduled interval follow-up. She notes no acute new concerns. Has been continuing to remain active in sports and has been doing well. No other acute new concerns. No new bone pains. No fevers or chills. Weight has been stable. Myeloma labs in today pending. CBC shows stable counts. CMP shows stable total protein levels, no hypercalcemia, unchanged creatinine.  MEDICAL HISTORY:  Past Medical History  Diagnosis Date  . DJD (degenerative joint disease)   . Atrial fibrillation ([place]) 12/2010    PMH of ; [place] , [place] [facility]  . Cancer ([address]) 2000    SKIN CANCER IN [place]  . Blood transfusion during current hospitalization 9794    complication after childbirth  . Skin cancer of face 2015    Patient reports this was likely basal cell on the right side of her face needing 14 stitches.   . Patient Active Problem List   Diagnosis Date Noted  . Hypergammaglobulinemia   . IgG monoclonal gammopathy of uncertain significance   . Abnormal gamma globulin level 04/14/2015  . Hyperlipidemia 04/03/2015  . Hyperglycemia 04/03/2015  . Diverticulosis of colon without hemorrhage 04/03/2015  . Arthralgia of multiple joints 04/03/2015  . DEGENERATIVE JOINT DISEASE 01/20/2010  . SKIN CANCER, HX OF 01/20/2010    SURGICAL HISTORY: Past Surgical History  Procedure Laterality Date  . G 3 p 1    . Colonoscopy  02/2013    negative X 3; Dr Olevia Perches  . Cholecystectomy  1990's  . T&a  child  . Wisdom tooth extraction  teenager  . Shoulder surgery Right 2012    R ; Dr  Durward Fortes for rotator tear  . Shoulder surgery Left 06/2014    Dr. Sydnee Cabal for rotator cuff    SOCIAL HISTORY: Social History   Social History  . Marital Status: Married    Spouse Name: N/A  . Number of Children: N/A  . Years of Education: N/A   Occupational History  . Not on file.   Social History Main Topics  . Smoking status: Former Smoker -- 0.25 packs/day for 3 years    Quit date: 11/07/1970  . Smokeless tobacco: Never Used     Comment: 1/4 ppd [id number]  . Alcohol Use: Yes     Comment: Wine 14 glasses/week  . Drug Use: No  . Sexual Activity: No   Other Topics Concern  . Not on file   Social History Narrative    FAMILY HISTORY: Family History  Problem Relation Age of Onset  . Atrial fibrillation Father   . Asthma Brother   . Colon cancer Neg Hx   . Rectal cancer Neg Hx   . Stomach cancer Neg Hx   . Diabetes Neg Hx   . Stroke Neg Hx   . Heart attack Neg Hx   . Alzheimer's disease Mother   . Myasthenia gravis Mother     ocular  . Depression Daughter   . Breast cancer Paternal Grandmother     ALLERGIES:  has No Known Allergies.  MEDICATIONS:  Current Outpatient Prescriptions  Medication Sig Dispense Refill  . ibuprofen (ADVIL,MOTRIN) 100  MG tablet Take 100 mg by mouth every 6 (six) hours as needed for pain.     . traMADol (ULTRAM) 50 MG tablet Take 1 tablet (50 mg total) by mouth every 8 (eight) hours as needed. 30 tablet 2   No current facility-administered medications for this visit.    REVIEW OF SYSTEMS:    10 Point review of Systems was done is negative except as noted above.  PHYSICAL EXAMINATION: ECOG PERFORMANCE STATUS: 0-1  . Filed Vitals:   09/21/15 0941  Height: 5' 5.75" (1.67 m)  Weight: 145 lb 14.4 oz (66.18 kg)   Filed Weights   09/21/15 0941  Weight: 145 lb 14.4 oz (66.18 kg)   .Body mass index is 23.73 kg/(m^2).  GENERAL:alert, in no acute distress and comfortable SKIN: skin color, texture, turgor are normal, no rashes  or significant lesions EYES: normal, conjunctiva are pink and non-injected, sclera clear OROPHARYNX:no exudate, no erythema and lips, buccal mucosa, and tongue normal  NECK: supple, no JVD, thyroid normal size, non-tender, without nodularity LYMPH:  no palpable lymphadenopathy in the cervical, axillary or inguinal LUNGS: clear to auscultation with normal respiratory effort HEART: regular rate & rhythm,  no murmurs and no lower extremity edema ABDOMEN: abdomen soft, non-tender, normoactive bowel sounds  Musculoskeletal: no cyanosis of digits and no clubbing  PSYCH: alert & oriented x 3 with fluent speech NEURO: no focal motor/sensory deficits  LABORATORY DATA:  I have reviewed the data as listed  . CBC Latest Ref Rng 09/21/2015 07/17/2015 07/06/2015  WBC 3.9 - 10.3 10e3/uL 7.0 6.1 6.5  Hemoglobin 11.6 - 15.9 g/dL 13.5 13.9 13.4  Hematocrit 34.8 - 46.6 % 39.6 40.3 38.5  Platelets 145 - 400 10e3/uL 255 278 258    . CMP Latest Ref Rng 09/21/2015 07/06/2015 04/13/2015  Glucose 70 - 140 mg/dl 122 95 105(H)  BUN 7.0 - 26.0 mg/dL 18.7 18.2 20  Creatinine 0.6 - 1.1 mg/dL 0.9 0.8 0.98  Sodium 136 - 145 mEq/L 139 140 135  Potassium 3.5 - 5.1 mEq/L 4.1 4.1 4.4  Chloride 96 - 112 mEq/L - - 104  CO2 22 - 29 mEq/L _0 Calcium 8.4 - 10.4 mg/dL 9.3 9.5 9.3  Total Protein 6.4 - 8.3 g/dL 8.6(H) 9.1(H) 8.9(H)  Total Bilirubin 0.20 - 1.20 mg/dL 0.62 0.58 0.6  Alkaline Phos 40 - 150 U/L 56 59 50  AST 5 - 34 U/L _1 ALT 0 - 55 U/L _2 . Lab Results  Component Value Date   TOTALPROTELP 8.6* 07/06/2015   ALBUMINELP 4.2 07/06/2015   A1GS 0.3 07/06/2015   A2GS 0.7 07/06/2015   BETS 0.5 07/06/2015   BETA2SER 0.2 07/06/2015   GAMS 2.9* 07/06/2015   SPEI * 07/06/2015   (this displays SPEP labs)  Lab Results  Component Value Date   KPAFRELGTCHN 0.73 07/06/2015   LAMBDASER 1.11 07/06/2015   KAPLAMBRATIO 0.66 07/06/2015   (kappa/lambda light chains)  Myeloma labs today  pending  RADIOGRAPHIC STUDIES: I have personally reviewed the radiological images as listed and agreed with the findings in the report. No results found. Normal DEXA scan in June 2015     Cytogenetic analysis: Revealed the presence of a normal female chromosomes with no observable clonal chromosomal abnormalities.   ASSESSMENT & PLAN:   69 year old Caucasian female in good overall health with  #1Smoldering Multiple Myeloma IgG lambda. Bone marrow plasma cells more than 10% ( noted to have 35% clonal plasma  cells] Normal female karyotype cytogenetics with no other abnormalities SPEP shows M spike of 2.6 with IFE showing IgG lambda paraprotein. Associated with some Immunoparesis characterized by associated decrease in IgA and IgM.  Patient on follow-up today is feeling great and has no acute new clinical concerns. Labs show no evidence of anemia, renal failure. hypercalcemia and bone lesions. No new bone pains. Total protein level stable.  Plan -We'll follow up results of SPEP and kappa/lambda light chains. -Return to care in 3 months with repeat myeloma labs.  All off the patients questions were answered to her apparent satisfaction. The patient knows to call the clinic with any problems, questions or concerns.  I spent 15 minutes counseling the patient face to face. The total time spent in the appointment was 20 minutes and more than 50% was on counseling and direct patient cares.    Sullivan Lone MD [place] AAHIVMS [facility] Hematology/Oncology Physician [place]  (Office):       [phone] (Work cell):  [phone] (Fax):           [phone]

## 2015-09-23 LAB — SPEP & IFE WITH QIG
Abnormal Protein Band1: 2.2 g/dL
Albumin ELP: 4.2 g/dL (ref 3.8–4.8)
Alpha-1-Globulin: 0.3 g/dL (ref 0.2–0.3)
Alpha-2-Globulin: 0.7 g/dL (ref 0.5–0.9)
Beta 2: 0.2 g/dL (ref 0.2–0.5)
Beta Globulin: 0.5 g/dL (ref 0.4–0.6)
Gamma Globulin: 2.7 g/dL — ABNORMAL HIGH (ref 0.8–1.7)
IgA: 17 mg/dL — ABNORMAL LOW (ref 69–380)
IgG (Immunoglobin G), Serum: 3060 mg/dL — ABNORMAL HIGH (ref 690–1700)
IgM, Serum: <5 mg/dL — ABNORMAL LOW (ref 52–322)
Total Protein, Serum Electrophoresis: 8.5 g/dL — ABNORMAL HIGH (ref 6.1–8.1)

## 2015-09-23 LAB — KAPPA/LAMBDA LIGHT CHAINS
Kappa free light chain: 0.65 mg/dL (ref 0.33–1.94)
Kappa:Lambda Ratio: 0.45 (ref 0.26–1.65)
Lambda Free Lght Chn: 1.43 mg/dL (ref 0.57–2.63)

## 2015-09-23 LAB — BETA 2 MICROGLOBULIN, SERUM: Beta-2 Microglobulin: 2.91 mg/L — ABNORMAL HIGH (ref <=2.51)

## 2015-10-05 ENCOUNTER — Encounter: Payer: Self-pay | Admitting: Hematology

## 2015-10-11 LAB — UPEP/TP, 24-HR URINE
Collection Interval: 24 hours
Total Protein, Urine/Day: 120 mg/d — ABNORMAL HIGH (ref 50–100)
Total Protein, Urine: 4 mg/dL
Total Volume, Urine: 3000 mL

## 2015-10-11 LAB — UIFE/LIGHT CHAINS/TP QN, 24-HR UR
Albumin, U: DETECTED
Alpha 1, Urine: DETECTED — AB
Alpha 2, Urine: DETECTED — AB
Beta, Urine: DETECTED — AB
Gamma Globulin, Urine: DETECTED — AB
Time: 24 hours
Total Protein, Urine: 4 mg/dL — ABNORMAL LOW (ref 5–24)
Volume, Urine: 3000 mL

## 2015-10-11 LAB — 24 HR URINE,KAPPA/LAMBDA LIGHT CHAINS
24H Urine Volume: 3000 mL/24 h
Measured Kappa Chain: <0.4 mg/dL (ref <2.00)
Measured Lambda Chain: <0.4 mg/dL (ref <2.00)

## 2015-10-29 NOTE — Telephone Encounter (Signed)
F/U visit was on 09-21-2015 and questions answered per office note.

## 2015-12-22 ENCOUNTER — Ambulatory Visit (HOSPITAL_BASED_OUTPATIENT_CLINIC_OR_DEPARTMENT_OTHER): Payer: Medicare Other | Admitting: Hematology

## 2015-12-22 ENCOUNTER — Other Ambulatory Visit (HOSPITAL_BASED_OUTPATIENT_CLINIC_OR_DEPARTMENT_OTHER): Payer: Medicare Other

## 2015-12-22 ENCOUNTER — Encounter: Payer: Self-pay | Admitting: Hematology

## 2015-12-22 ENCOUNTER — Telehealth: Payer: Self-pay | Admitting: Hematology

## 2015-12-22 VITALS — BP 164/65 | HR 66 | Temp 97.5°F | Resp 18 | Ht 65.75 in | Wt 148.2 lb

## 2015-12-22 DIAGNOSIS — C9000 Multiple myeloma not having achieved remission: Secondary | ICD-10-CM

## 2015-12-22 DIAGNOSIS — E559 Vitamin D deficiency, unspecified: Secondary | ICD-10-CM

## 2015-12-22 DIAGNOSIS — D472 Monoclonal gammopathy: Secondary | ICD-10-CM

## 2015-12-22 LAB — COMPREHENSIVE METABOLIC PANEL
ALT: 23 U/L (ref 0–55)
AST: 19 U/L (ref 5–34)
Albumin: 3.7 g/dL (ref 3.5–5.0)
Alkaline Phosphatase: 54 U/L (ref 40–150)
Anion Gap: 8 mEq/L (ref 3–11)
BUN: 24.4 mg/dL (ref 7.0–26.0)
CO2: 23 mEq/L (ref 22–29)
Calcium: 9.2 mg/dL (ref 8.4–10.4)
Chloride: 107 mEq/L (ref 98–109)
Creatinine: 0.9 mg/dL (ref 0.6–1.1)
EGFR: 62 mL/min/{1.73_m2} — ABNORMAL LOW (ref >90)
Glucose: 88 mg/dl (ref 70–140)
Potassium: 4.5 mEq/L (ref 3.5–5.1)
Sodium: 138 mEq/L (ref 136–145)
Total Bilirubin: 0.58 mg/dL (ref 0.20–1.20)
Total Protein: 9.1 g/dL — ABNORMAL HIGH (ref 6.4–8.3)

## 2015-12-22 LAB — CBC & DIFF AND RETIC
BASO%: 0.2 % (ref 0.0–2.0)
Basophils Absolute: 0 10*3/uL (ref 0.0–0.1)
EOS%: 2 % (ref 0.0–7.0)
Eosinophils Absolute: 0.1 10*3/uL (ref 0.0–0.5)
HCT: 39.7 % (ref 34.8–46.6)
HGB: 14 g/dL (ref 11.6–15.9)
Immature Retic Fract: 3.6 % (ref 1.60–10.00)
LYMPH%: 44.5 % (ref 14.0–49.7)
MCH: 32.6 pg (ref 25.1–34.0)
MCHC: 35.3 g/dL (ref 31.5–36.0)
MCV: 92.5 fL (ref 79.5–101.0)
MONO#: 0.3 10*3/uL (ref 0.1–0.9)
MONO%: 4 % (ref 0.0–14.0)
NEUT#: 3.2 10*3/uL (ref 1.5–6.5)
NEUT%: 49.3 % (ref 38.4–76.8)
Platelets: 259 10*3/uL (ref 145–400)
RBC: 4.29 10*6/uL (ref 3.70–5.45)
RDW: 12.4 % (ref 11.2–14.5)
Retic %: 1.47 % (ref 0.70–2.10)
Retic Ct Abs: 63.06 10*3/uL (ref 33.70–90.70)
WBC: 6.5 10*3/uL (ref 3.9–10.3)
lymph#: 2.9 10*3/uL (ref 0.9–3.3)

## 2015-12-22 NOTE — Telephone Encounter (Signed)
Pt confirmed labs/ov per 02/14 POF, gave pt AVS and Calendar... KJ °

## 2015-12-23 LAB — KAPPA/LAMBDA LIGHT CHAINS
Ig Kappa Free Light Chain: 8.13 mg/L (ref 3.30–19.40)
Ig Lambda Free Light Chain: 10.08 mg/L (ref 5.71–26.30)
Kappa/Lambda FluidC Ratio: 0.81 (ref 0.26–1.65)

## 2015-12-24 LAB — MULTIPLE MYELOMA PANEL, SERUM
Albumin SerPl Elph-Mcnc: 3.8 g/dL (ref 2.9–4.4)
Albumin/Glob SerPl: 0.9 (ref 0.7–1.7)
Alpha 1: 0.3 g/dL (ref 0.0–0.4)
Alpha2 Glob SerPl Elph-Mcnc: 0.7 g/dL (ref 0.4–1.0)
B-Globulin SerPl Elph-Mcnc: 1 g/dL (ref 0.7–1.3)
Gamma Glob SerPl Elph-Mcnc: 2.6 g/dL — ABNORMAL HIGH (ref 0.4–1.8)
Globulin, Total: 4.6 g/dL — ABNORMAL HIGH (ref 2.2–3.9)
IgA, Qn, Serum: 20 mg/dL — ABNORMAL LOW (ref 64–422)
IgG, Qn, Serum: 3270 mg/dL — ABNORMAL HIGH (ref 700–1600)
IgM, Qn, Serum: 6 mg/dL — ABNORMAL LOW (ref 26–217)
M Protein SerPl Elph-Mcnc: 2.4 g/dL — ABNORMAL HIGH
Total Protein: 8.4 g/dL (ref 6.0–8.5)

## 2015-12-26 NOTE — Progress Notes (Signed)
Marland Kitchen    HEMATOLOGY/ONCOLOGY CONSULTATION NOTE  Date of Service: .12/22/2015   Patient Care Team: Hendricks Limes, MD as PCP - General  CHIEF COMPLAINTS/PURPOSE OF CONSULTATION:  Follow up for smoldering multiple myeloma  Diagnosis: Smoldering multiple myeloma Treatment: Close monitoring  HISTORY OF PRESENTING ILLNESS: please see my initial consultation for details of her initial presentation  INTERVAL HISTORY  Ms Jennifer [name] is here for her scheduled interval follow-up. She notes no acute new concerns. No fevers/chills/ new focal bone pains/new fatigue/unexpected weight loss. Has remained very physically active and has been feeling well. She had some additional questions about what smoldering multiple myeloma was and was giving some reading material . We discussed the indications for treatment. Her husband and patient were wondering about early treatment and its indications. We went over the criteria for treatment. I also recommended that they could certainly seek a 2nd opinion at [facility] or a different center of their choice. We also reviewed potential clinical trials on clinicaltrials.gov at other sites that she might want to consider.   MEDICAL HISTORY:  Past Medical History  Diagnosis Date  . DJD (degenerative joint disease)   . Atrial fibrillation ([place]) 12/2010    PMH of ; [place] , [place] [facility]  . Cancer ([place]) 2000    SKIN CANCER IN [place]  . Blood transfusion during current hospitalization 3419    complication after childbirth  . Skin cancer of face 2015    Patient reports this was likely basal cell on the right side of her face needing 14 stitches.   . Patient Active Problem List   Diagnosis Date Noted  . Smoldering multiple myeloma ([place]) 12/22/2015  . Hypergammaglobulinemia   . IgG monoclonal gammopathy of uncertain significance   . Abnormal gamma globulin level 04/14/2015  . Hyperlipidemia 04/03/2015  . Hyperglycemia 04/03/2015  . Diverticulosis of colon without hemorrhage  04/03/2015  . Arthralgia of multiple joints 04/03/2015  . DEGENERATIVE JOINT DISEASE 01/20/2010  . SKIN CANCER, HX OF 01/20/2010    SURGICAL HISTORY: Past Surgical History  Procedure Laterality Date  . G 3 p 1    . Colonoscopy  02/2013    negative X 3; Dr Olevia Perches  . Cholecystectomy  1990's  . T&a  child  . Wisdom tooth extraction  teenager  . Shoulder surgery Right 2012    R ; Dr Durward Fortes for rotator tear  . Shoulder surgery Left 06/2014    Dr. Sydnee Cabal for rotator cuff    SOCIAL HISTORY: Social History   Social History  . Marital Status: Married    Spouse Name: N/A  . Number of Children: N/A  . Years of Education: N/A   Occupational History  . Not on file.   Social History Main Topics  . Smoking status: Former Smoker -- 0.25 packs/day for 3 years    Quit date: 11/07/1970  . Smokeless tobacco: Never Used     Comment: 1/4 ppd [id number]  . Alcohol Use: Yes     Comment: Wine 14 glasses/week  . Drug Use: No  . Sexual Activity: No   Other Topics Concern  . Not on file   Social History Narrative    FAMILY HISTORY: Family History  Problem Relation Age of Onset  . Atrial fibrillation Father   . Asthma Brother   . Colon cancer Neg Hx   . Rectal cancer Neg Hx   . Stomach cancer Neg Hx   . Diabetes Neg Hx   . Stroke Neg Hx   .  Heart attack Neg Hx   . Alzheimer's disease Mother   . Myasthenia gravis Mother     ocular  . Depression Daughter   . Breast cancer Paternal Grandmother     ALLERGIES:  has No Known Allergies.  MEDICATIONS:  Current Outpatient Prescriptions  Medication Sig Dispense Refill  . ibuprofen (ADVIL,MOTRIN) 100 MG tablet Take 100 mg by mouth every 6 (six) hours as needed for pain.     . traMADol (ULTRAM) 50 MG tablet Take 1 tablet (50 mg total) by mouth every 8 (eight) hours as needed. 30 tablet 2   No current facility-administered medications for this visit.    REVIEW OF SYSTEMS:    10 Point review of Systems was done is negative  except as noted above.  PHYSICAL EXAMINATION: ECOG PERFORMANCE STATUS: 0-1  . Filed Vitals:   12/22/15 0959  Height: 5' 5.75" (1.67 m)  Weight: 148 lb 3.2 oz (67.223 kg)   Filed Weights   12/22/15 0959  Weight: 148 lb 3.2 oz (67.223 kg)   .Body mass index is 24.1 kg/(m^2).  GENERAL:alert, in no acute distress and comfortable SKIN: skin color, texture, turgor are normal, no rashes or significant lesions EYES: normal, conjunctiva are pink and non-injected, sclera clear OROPHARYNX:no exudate, no erythema and lips, buccal mucosa, and tongue normal  NECK: supple, no JVD, thyroid normal size, non-tender, without nodularity LYMPH:  no palpable lymphadenopathy in the cervical, axillary or inguinal LUNGS: clear to auscultation with normal respiratory effort HEART: regular rate & rhythm,  no murmurs and no lower extremity edema ABDOMEN: abdomen soft, non-tender, normoactive bowel sounds  Musculoskeletal: no cyanosis of digits and no clubbing  PSYCH: alert & oriented x 3 with fluent speech NEURO: no focal motor/sensory deficits  LABORATORY DATA:  I have reviewed the data as listed  . CBC Latest Ref Rng 12/22/2015 09/21/2015 07/17/2015  WBC 3.9 - 10.3 10e3/uL 6.5 7.0 6.1  Hemoglobin 11.6 - 15.9 g/dL 14.0 13.5 13.9  Hematocrit 34.8 - 46.6 % 39.7 39.6 40.3  Platelets 145 - 400 10e3/uL 259 255 278    . CMP Latest Ref Rng 12/22/2015 12/22/2015 09/21/2015  Glucose 70 - 140 mg/dl 88 - 122  BUN 7.0 - 26.0 mg/dL 24.4 - 18.7  Creatinine 0.6 - 1.1 mg/dL 0.9 - 0.9  Sodium 136 - 145 mEq/L 138 - 139  Potassium 3.5 - 5.1 mEq/L 4.5 - 4.1  Chloride 96 - 112 mEq/L - - -  CO2 22 - 29 mEq/L 23 - 23  Calcium 8.4 - 10.4 mg/dL 9.2 - 9.3  Total Protein 6.0 - 8.5 g/dL 9.1(H) 8.4 8.6(H)  Total Bilirubin 0.20 - 1.20 mg/dL 0.58 - 0.62  Alkaline Phos 40 - 150 U/L 54 - 56  AST 5 - 34 U/L 19 - 19  ALT 0 - 55 U/L 23 - 19   . Lab Results  Component Value Date   TOTALPROTELP 8.5* 09/21/2015   ALBUMINELP  4.2 09/21/2015   A1GS 0.3 09/21/2015   A2GS 0.7 09/21/2015   BETS 0.5 09/21/2015   BETA2SER 0.2 09/21/2015   GAMS 2.7* 09/21/2015   SPEI * 09/21/2015   (this displays SPEP labs)   (kappa/lambda light chains)     RADIOGRAPHIC STUDIES: I have personally reviewed the radiological images as listed and agreed with the findings in the report. No results found. Normal DEXA scan in June 2015     Cytogenetic analysis: Revealed the presence of a normal female chromosomes with no observable clonal chromosomal abnormalities.  ASSESSMENT & PLAN:   69 year old Caucasian female in good overall health with  #1Smoldering Multiple Myeloma IgG lambda. Bone marrow plasma cells more than 10% ( noted to have 35% clonal plasma cells] Normal female karyotype cytogenetics with no other abnormalities SPEP shows M spike of 2.4 (no significant increase) with IFE showing IgG lambda paraprotein. Associated with some Immunoparesis characterized by associated decrease in IgA and IgM.  M protein 2.6--> 2.6---> 2.2--->2.4  SFLC slightly increased but ratio still ok   Patient on follow-up today is feeling great and has no acute new clinical concerns. Labs show no evidence of anemia, renal failure. hypercalcemia and bone lesions. No new bone pains. Total protein level stable. No issues with infections.  Plan -no overt indication for treatment of patients SMM -she was provided additional educational material regarding SMM and MM -she and her husband was shown the clinicaltrials.gov website for consideration of clinical trials that are not available at our cancer center but might be something that she might consider. -will hold off on rpt BM Bx in the absence of significant new clinical or lab concerns. -needs to f/u with PCP for DEXA scan (due in 03/2016) and aggressive treatment of any Vit D deficiency or osteopenia/osteoporosis if present. -Return to care in 4 months with repeat myeloma labs, cbc, cmp  and 25 OH vit D  All off the patients questions were answered to her apparent satisfaction. The patient knows to call the clinic with any problems, questions or concerns.  I spent 20 minutes counseling the patient face to face. The total time spent in the appointment was 25 minutes and more than 50% was on counseling and direct patient cares.    Sullivan Lone MD [place] AAHIVMS [place] [facility] Hematology/Oncology Physician [place]  (Office):       [phone] (Work cell):  [phone] (Fax):           [phone]

## 2015-12-27 DIAGNOSIS — E559 Vitamin D deficiency, unspecified: Secondary | ICD-10-CM | POA: Insufficient documentation

## 2016-02-11 ENCOUNTER — Encounter: Payer: Self-pay | Admitting: Hematology

## 2016-04-05 ENCOUNTER — Encounter: Payer: Self-pay | Admitting: Nurse Practitioner

## 2016-04-05 ENCOUNTER — Ambulatory Visit (INDEPENDENT_AMBULATORY_CARE_PROVIDER_SITE_OTHER): Payer: Medicare Other | Admitting: Nurse Practitioner

## 2016-04-05 VITALS — BP 124/70 | HR 72 | Ht 65.5 in | Wt 143.0 lb

## 2016-04-05 DIAGNOSIS — R109 Unspecified abdominal pain: Secondary | ICD-10-CM

## 2016-04-05 DIAGNOSIS — Z01419 Encounter for gynecological examination (general) (routine) without abnormal findings: Secondary | ICD-10-CM

## 2016-04-05 DIAGNOSIS — Z0000 Encounter for general adult medical examination without abnormal findings: Secondary | ICD-10-CM | POA: Diagnosis not present

## 2016-04-05 LAB — POCT URINALYSIS DIPSTICK
Bilirubin, UA: NEGATIVE
Glucose, UA: NEGATIVE
Ketones, UA: NEGATIVE
Leukocytes, UA: NEGATIVE
Nitrite, UA: NEGATIVE
Protein, UA: NEGATIVE
Urobilinogen, UA: NEGATIVE
pH, UA: 5

## 2016-04-05 NOTE — Progress Notes (Signed)
Patient ID: Jennifer [name], female   DOB: [birth year], 70 y.o.   MRN: [id number]  69 y.o. [id number] Married  Caucasian Fe here for annual exam.  New diagnosis of smoldering Multiple Myeloma since 06/2015.  Still very active and continues to playing pickle ball.  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  She feels well and has a positive attitude.    Patient's last menstrual period was 11/07/1996.          Sexually active: No.  The current method of family planning is post menopausal status.    Exercising: Yes.    Pickleball and golf Smoker:  no  Health Maintenance: Pap:03/30/15, Negative MMG: 03/24/15, 3D, Bi-Rads 1:  Negative Colonoscopy: 2014 small amount of diverticula, recheck in 10 years BMD: 03/28/14 T Score, 2.0 Spine / 0.0 Right Femur Neck / 0.0 Left Femur Neck / 2.0 Left Radius  TDaP: 03/2011 Shingles: 09/18/2008 Pneumonia: 10/12/12 Hep C and HIV: 07/06/15 Labs: Dr. Linna Darner and [place]   reports that she quit smoking about 45 years ago. She has never used smokeless tobacco. She reports that she drinks alcohol. She reports that she does not use illicit drugs.  Past Medical History  Diagnosis Date  . DJD (degenerative joint disease)   . Atrial fibrillation ([place]) 12/2010    PMH of ; [place] , [place] [facility]  . Cancer ([place]) 2000    SKIN CANCER IN [place]  . Blood transfusion during current hospitalization 4268    complication after childbirth  . Skin cancer of face 2015    Patient reports this was likely basal cell on the right side of her face needing 14 stitches.  . Smoldering multiple myeloma ([place]) 2016    Past Surgical History  Procedure Laterality Date  . G 3 p 1    . Colonoscopy  02/2013    negative X 3; Dr Olevia Perches  . Cholecystectomy  1990's  . T&a  child  . Wisdom tooth extraction  teenager  . Shoulder surgery Right 2012    R ; Dr Durward Fortes for rotator tear  . Shoulder surgery Left 06/2014    Dr. Sydnee Cabal for rotator cuff    Current  Outpatient Prescriptions  Medication Sig Dispense Refill  . Naproxen Sodium (ALEVE) 220 MG CAPS Take 2 capsules by mouth 2 (two) times daily.     No current facility-administered medications for this visit.    Family History  Problem Relation Age of Onset  . Atrial fibrillation Father   . Asthma Brother   . Colon cancer Neg Hx   . Rectal cancer Neg Hx   . Stomach cancer Neg Hx   . Diabetes Neg Hx   . Stroke Neg Hx   . Heart attack Neg Hx   . Alzheimer's disease Mother   . Myasthenia gravis Mother     ocular  . Depression Daughter   . Breast cancer Paternal Grandmother     ROS:  Pertinent items are noted in HPI.  Otherwise, a comprehensive ROS was negative.  Exam:   BP 124/70 mmHg  Pulse 72  Ht 5' 5.5" (1.664 m)  Wt 143 lb (64.864 kg)  BMI 23.43 kg/m2  LMP 11/07/1996 Height: 5' 5.5" (166.4 cm) Ht Readings from Last 3 Encounters:  04/05/16 5' 5.5" (1.664 m)  12/22/15 5' 5.75" (1.67 m)  09/21/15 5' 5.75" (1.67 m)    General appearance: alert, cooperative and appears stated age Head: Normocephalic, without obvious abnormality, atraumatic Neck: no  adenopathy, supple, symmetrical, trachea midline and thyroid normal to inspection and palpation Lungs: clear to auscultation bilaterally Breasts: normal appearance, no masses or tenderness Heart: regular rate and rhythm Abdomen: soft, non-tender; no masses,  no organomegaly, slight right flank pain. Extremities: extremities normal, atraumatic, no cyanosis or edema Skin: Skin color, texture, turgor normal. No rashes or lesions Lymph nodes: Cervical, supraclavicular, and axillary nodes normal. No abnormal inguinal nodes palpated Neurologic: Grossly normal   Pelvic: External genitalia:  no lesions              Urethra:  normal appearing urethra with no masses, tenderness or lesions              Bartholin's and Skene's: normal                 Vagina: normal appearing vagina with normal color and discharge, no lesions               Cervix: anteverted              Pap taken: No. Bimanual Exam:  Uterus:  normal size, contour, position, consistency, mobility, non-tender              Adnexa: no mass, fullness, tenderness               Rectovaginal: Confirms               Anus:  normal sphincter tone, no lesions  Chaperone present: yes  A:  Well Woman with normal exam  Postmenopausal - off HRT since 02/2015  New diagnosis of "smoldering" Multiple Myeloma  Right flank pain - R/O UTI   P:   Reviewed health and wellness pertinent to exam  Pap smear as above  Mammogram is due now and will schedule  Encouragement given  Will follow up with urine culture  Counseled on breast self exam, mammography screening, adequate intake of calcium and vitamin D, diet and exercise, Kegel's exercises return annually or prn  An After Visit Summary was printed and given to the patient.

## 2016-04-05 NOTE — Patient Instructions (Addendum)

## 2016-04-06 LAB — URINE CULTURE
Colony Count: NO GROWTH
Organism ID, Bacteria: NO GROWTH

## 2016-04-06 NOTE — Progress Notes (Signed)
Reviewed personally.  M. Suzanne Shubham Thackston, MD.  

## 2016-04-13 ENCOUNTER — Telehealth: Payer: Self-pay | Admitting: Hematology

## 2016-04-13 NOTE — Telephone Encounter (Signed)
pt called to resched 6/14 apt to 6/12 , pt aware of date & time

## 2016-04-18 ENCOUNTER — Ambulatory Visit (HOSPITAL_BASED_OUTPATIENT_CLINIC_OR_DEPARTMENT_OTHER): Payer: Medicare Other | Admitting: Hematology

## 2016-04-18 ENCOUNTER — Encounter: Payer: Self-pay | Admitting: Hematology

## 2016-04-18 ENCOUNTER — Telehealth: Payer: Self-pay | Admitting: Hematology

## 2016-04-18 ENCOUNTER — Other Ambulatory Visit (HOSPITAL_BASED_OUTPATIENT_CLINIC_OR_DEPARTMENT_OTHER): Payer: Medicare Other

## 2016-04-18 VITALS — BP 161/92 | HR 61 | Temp 97.8°F | Resp 20 | Ht 65.5 in | Wt 145.5 lb

## 2016-04-18 DIAGNOSIS — C9000 Multiple myeloma not having achieved remission: Secondary | ICD-10-CM

## 2016-04-18 DIAGNOSIS — D472 Monoclonal gammopathy: Secondary | ICD-10-CM

## 2016-04-18 DIAGNOSIS — E559 Vitamin D deficiency, unspecified: Secondary | ICD-10-CM

## 2016-04-18 LAB — COMPREHENSIVE METABOLIC PANEL
ALT: 22 U/L (ref 0–55)
AST: 22 U/L (ref 5–34)
Albumin: 3.7 g/dL (ref 3.5–5.0)
Alkaline Phosphatase: 61 U/L (ref 40–150)
Anion Gap: 7 mEq/L (ref 3–11)
BUN: 21.8 mg/dL (ref 7.0–26.0)
CO2: 25 mEq/L (ref 22–29)
Calcium: 9.4 mg/dL (ref 8.4–10.4)
Chloride: 106 mEq/L (ref 98–109)
Creatinine: 0.9 mg/dL (ref 0.6–1.1)
EGFR: 64 mL/min/{1.73_m2} — ABNORMAL LOW (ref >90)
Glucose: 88 mg/dl (ref 70–140)
Potassium: 4.2 mEq/L (ref 3.5–5.1)
Sodium: 138 mEq/L (ref 136–145)
Total Bilirubin: 0.57 mg/dL (ref 0.20–1.20)
Total Protein: 8.6 g/dL — ABNORMAL HIGH (ref 6.4–8.3)

## 2016-04-18 LAB — CBC & DIFF AND RETIC
BASO%: 0.1 % (ref 0.0–2.0)
Basophils Absolute: 0 10*3/uL (ref 0.0–0.1)
EOS%: 1.2 % (ref 0.0–7.0)
Eosinophils Absolute: 0.1 10*3/uL (ref 0.0–0.5)
HCT: 36.8 % (ref 34.8–46.6)
HGB: 12.8 g/dL (ref 11.6–15.9)
Immature Retic Fract: 3.5 % (ref 1.60–10.00)
LYMPH%: 37.4 % (ref 14.0–49.7)
MCH: 32.6 pg (ref 25.1–34.0)
MCHC: 34.8 g/dL (ref 31.5–36.0)
MCV: 93.6 fL (ref 79.5–101.0)
MONO#: 0.3 10*3/uL (ref 0.1–0.9)
MONO%: 3.6 % (ref 0.0–14.0)
NEUT#: 4.4 10*3/uL (ref 1.5–6.5)
NEUT%: 57.7 % (ref 38.4–76.8)
Platelets: 245 10*3/uL (ref 145–400)
RBC: 3.93 10*6/uL (ref 3.70–5.45)
RDW: 12.9 % (ref 11.2–14.5)
Retic %: 1.58 % (ref 0.70–2.10)
Retic Ct Abs: 62.09 10*3/uL (ref 33.70–90.70)
WBC: 7.6 10*3/uL (ref 3.9–10.3)
lymph#: 2.8 10*3/uL (ref 0.9–3.3)

## 2016-04-18 NOTE — Progress Notes (Signed)
Marland Kitchen    HEMATOLOGY/ONCOLOGY CONSULTATION NOTE  Date of Service: 04/18/2016    Patient Care Team: [name], MD as PCP - General (Family Medicine)  CHIEF COMPLAINTS/PURPOSE OF CONSULTATION:  Follow up for smoldering multiple myeloma  Diagnosis: Smoldering multiple myeloma Treatment: Close monitoring  HISTORY OF PRESENTING ILLNESS: please see my initial consultation for details of her initial presentation  INTERVAL HISTORY  Ms Hush is here for her scheduled interval follow-up. She notes no acute new concerns. No fevers/chills/ new focal bone pains/new fatigue/unexpected weight loss. Continues to remain very physically active and pursuing her competitive Pickle ball tournaments. Her blood counts are stable. Her M protein levels are stable. No hypercalcemia or renal failure. She reports that she would like to be seen at [facility] for a 2nd opinion and to evaluate if they might have any available clinical trial options.  No other acute new concerns.   MEDICAL HISTORY:  Past Medical History  Diagnosis Date  . DJD (degenerative joint disease)   . Atrial fibrillation ([place]) 12/2010    PMH of ; [place] , [place] [facility]  . Cancer ([place]) 2000    SKIN CANCER IN [place]  . Blood transfusion during current hospitalization 1224    complication after childbirth  . Skin cancer of face 2015    Patient reports this was likely basal cell on the right side of her face needing 14 stitches.  . Smoldering multiple myeloma ([place]) 2016   . Patient Active Problem List   Diagnosis Date Noted  . Vitamin D deficiency 12/27/2015  . Smoldering multiple myeloma ([place]) 12/22/2015  . Hypergammaglobulinemia   . IgG monoclonal gammopathy of uncertain significance   . Abnormal gamma globulin level 04/14/2015  . Hyperlipidemia 04/03/2015  . Hyperglycemia 04/03/2015  . Diverticulosis of colon without hemorrhage 04/03/2015  . Arthralgia of multiple joints 04/03/2015  . DEGENERATIVE JOINT DISEASE 01/20/2010  . SKIN  CANCER, HX OF 01/20/2010    SURGICAL HISTORY: Past Surgical History  Procedure Laterality Date  . G 3 p 1    . Colonoscopy  02/2013    negative X 3; Dr Olevia Perches  . Cholecystectomy  1990's  . T&a  child  . Wisdom tooth extraction  teenager  . Shoulder surgery Right 2012    R ; Dr Durward Fortes for rotator tear  . Shoulder surgery Left 06/2014    Dr. Sydnee Cabal for rotator cuff    SOCIAL HISTORY: Social History   Social History  . Marital Status: Married    Spouse Name: N/A  . Number of Children: N/A  . Years of Education: N/A   Occupational History  . Not on file.   Social History Main Topics  . Smoking status: Former Smoker -- 0.25 packs/day for 3 years    Quit date: 11/07/1970  . Smokeless tobacco: Never Used     Comment: 1/4 ppd [id number]  . Alcohol Use: Yes     Comment: Wine 14 glasses/week  . Drug Use: No  . Sexual Activity: No   Other Topics Concern  . Not on file   Social History Narrative    FAMILY HISTORY: Family History  Problem Relation Age of Onset  . Atrial fibrillation Father   . Asthma Brother   . Colon cancer Neg Hx   . Rectal cancer Neg Hx   . Stomach cancer Neg Hx   . Diabetes Neg Hx   . Stroke Neg Hx   . Heart attack Neg Hx   . Alzheimer's disease Mother   .  Myasthenia gravis Mother     ocular  . Depression Daughter   . Breast cancer Paternal Grandmother     ALLERGIES:  has No Known Allergies.  MEDICATIONS:  Current Outpatient Prescriptions  Medication Sig Dispense Refill  . Naproxen Sodium (ALEVE) 220 MG CAPS Take 2 capsules by mouth 2 (two) times daily.     No current facility-administered medications for this visit.    REVIEW OF SYSTEMS:    10 Point review of Systems was done is negative except as noted above.  PHYSICAL EXAMINATION: ECOG PERFORMANCE STATUS: 0-1  . Filed Vitals:   04/18/16 1458  Height: 5' 5.5" (1.664 m)  Weight: 145 lb 8 oz (65.998 kg)   Filed Weights   04/18/16 1458  Weight: 145 lb 8 oz (65.998 kg)     .Body mass index is 23.84 kg/(m^2).  GENERAL:alert, in no acute distress and comfortable SKIN: skin color, texture, turgor are normal, no rashes or significant lesions EYES: normal, conjunctiva are pink and non-injected, sclera clear OROPHARYNX:no exudate, no erythema and lips, buccal mucosa, and tongue normal  NECK: supple, no JVD, thyroid normal size, non-tender, without nodularity LYMPH:  no palpable lymphadenopathy in the cervical, axillary or inguinal LUNGS: clear to auscultation with normal respiratory effort HEART: regular rate & rhythm,  no murmurs and no lower extremity edema ABDOMEN: abdomen soft, non-tender, normoactive bowel sounds  Musculoskeletal: no cyanosis of digits and no clubbing  PSYCH: alert & oriented x 3 with fluent speech NEURO: no focal motor/sensory deficits  LABORATORY DATA:  I have reviewed the data as listed  . CBC Latest Ref Rng 04/18/2016 12/22/2015 09/21/2015  WBC 3.9 - 10.3 10e3/uL 7.6 6.5 7.0  Hemoglobin 11.6 - 15.9 g/dL 12.8 14.0 13.5  Hematocrit 34.8 - 46.6 % 36.8 39.7 39.6  Platelets 145 - 400 10e3/uL 245 259 255    . CMP Latest Ref Rng 04/18/2016 04/18/2016 12/22/2015  Glucose 70 - 140 mg/dl 88 - 88  BUN 7.0 - 26.0 mg/dL 21.8 - 24.4  Creatinine 0.6 - 1.1 mg/dL 0.9 - 0.9  Sodium 136 - 145 mEq/L 138 - 138  Potassium 3.5 - 5.1 mEq/L 4.2 - 4.5  CO2 22 - 29 mEq/L 25 - 23  Calcium 8.4 - 10.4 mg/dL 9.4 - 9.2  Total Protein 6.0 - 8.5 g/dL 8.6(H) 8.2 9.1(H)  Total Bilirubin 0.20 - 1.20 mg/dL 0.57 - 0.58  Alkaline Phos 40 - 150 U/L 61 - 54  AST 5 - 34 U/L 22 - 19  ALT 0 - 55 U/L 22 - 23     RADIOGRAPHIC STUDIES: I have personally reviewed the radiological images as listed and agreed with the findings in the report. No results found. Normal DEXA scan in June 2015     Cytogenetic analysis: Revealed the presence of a normal female chromosomes with no observable clonal chromosomal abnormalities.   ASSESSMENT & PLAN:   70 year old  Caucasian female in good overall health with  #1Smoldering Multiple Myeloma IgG lambda. Bone marrow plasma cells more than 10% ( noted to have 35% clonal plasma cells] Normal female karyotype cytogenetics with no other abnormalities SPEP shows M spike of 2.3 (no significant increase) with IFE showing IgG lambda paraprotein. Associated with some Immunoparesis characterized by associated decrease in IgA and IgM.  M protein 2.6--> 2.6---> 2.2--->2.4--->2.3  SFLC slightly increased but ratio still ok   Patient on follow-up today is feeling great and has no acute new clinical concerns. Labs show no evidence of anemia, renal failure. hypercalcemia  and bone lesions. No new bone pains. Total protein level stable. No issues with infections.  25OH Vit D level WNL Plan -no overt indication for treatment of patients SMM -she was provided a referral to see Dr. Cameron Sprang at [facility] for a 2nd opinion based on her request. She would also like to be evaluated for any clinical trials that might be available for her smoldering multiple myeloma. -will hold off on rpt BM Bx or skeletal survey in the absence of significant new clinical or lab concerns. -Return to care in 4 months with repeat myeloma labs, cbc, cmp  All off the patients questions were answered to her apparent satisfaction. The patient knows to call the clinic with any problems, questions or concerns.  I spent 20 minutes counseling the patient face to face. The total time spent in the appointment was 25 minutes and more than 50% was on counseling and direct patient cares.    Sullivan Lone MD [place] AAHIVMS [facility] Hematology/Oncology Physician [place]  (Office):       [phone] (Work cell):  [phone] (Fax):           [phone]

## 2016-04-18 NOTE — Telephone Encounter (Signed)
per pof to sch pt appt-gave pt copy of avs °

## 2016-04-19 LAB — VITAMIN D 25 HYDROXY (VIT D DEFICIENCY, FRACTURES): Vitamin D, 25-Hydroxy: 34.6 ng/mL (ref 30.0–100.0)

## 2016-04-20 ENCOUNTER — Ambulatory Visit: Payer: Medicare Other | Admitting: Hematology

## 2016-04-20 ENCOUNTER — Other Ambulatory Visit: Payer: Medicare Other

## 2016-04-20 LAB — MULTIPLE MYELOMA PANEL, SERUM
Albumin SerPl Elph-Mcnc: 3.9 g/dL (ref 2.9–4.4)
Albumin/Glob SerPl: 1 (ref 0.7–1.7)
Alpha 1: 0.2 g/dL (ref 0.0–0.4)
Alpha2 Glob SerPl Elph-Mcnc: 0.6 g/dL (ref 0.4–1.0)
B-Globulin SerPl Elph-Mcnc: 0.8 g/dL (ref 0.7–1.3)
Gamma Glob SerPl Elph-Mcnc: 2.6 g/dL — ABNORMAL HIGH (ref 0.4–1.8)
Globulin, Total: 4.3 g/dL — ABNORMAL HIGH (ref 2.2–3.9)
IgA, Qn, Serum: 20 mg/dL — ABNORMAL LOW (ref 64–422)
IgG, Qn, Serum: 2983 mg/dL — ABNORMAL HIGH (ref 700–1600)
IgM, Qn, Serum: <5 mg/dL — ABNORMAL LOW (ref 26–217)
M Protein SerPl Elph-Mcnc: 2.3 g/dL — ABNORMAL HIGH
Total Protein: 8.2 g/dL (ref 6.0–8.5)

## 2016-04-30 ENCOUNTER — Encounter: Payer: Self-pay | Admitting: Hematology

## 2016-05-03 ENCOUNTER — Encounter: Payer: Self-pay | Admitting: Hematology

## 2016-05-05 ENCOUNTER — Telehealth: Payer: Self-pay | Admitting: Hematology

## 2016-05-05 ENCOUNTER — Other Ambulatory Visit: Payer: Self-pay | Admitting: Hematology

## 2016-05-06 NOTE — Telephone Encounter (Signed)
Discuss the results of her SPEP in details. No significant change noted from previous.

## 2016-06-30 ENCOUNTER — Telehealth: Payer: Self-pay | Admitting: Hematology

## 2016-06-30 NOTE — Telephone Encounter (Signed)
PATIENT CALLED TO [facility] APPT DATE AND TIME. 06/30/16

## 2016-08-15 ENCOUNTER — Telehealth: Payer: Self-pay | Admitting: Hematology

## 2016-08-15 NOTE — Telephone Encounter (Signed)
COVERING AP - MOVED FROM 10/11 TO 10/16. SPOKE WITH PATIENT SHE IS AWARE.

## 2016-08-16 ENCOUNTER — Telehealth: Payer: Self-pay | Admitting: Hematology

## 2016-08-16 NOTE — Telephone Encounter (Signed)
10/16 Appointment rescheduled to 11/2 Per patient request. The patient called to cancel and reschedule her appointment. She requested that the appointment be on 11/2 in the afternoon.

## 2016-08-17 ENCOUNTER — Other Ambulatory Visit: Payer: Medicare Other

## 2016-08-17 ENCOUNTER — Ambulatory Visit: Payer: Medicare Other | Admitting: Hematology

## 2016-08-19 ENCOUNTER — Ambulatory Visit: Payer: Medicare Other | Admitting: Hematology

## 2016-08-19 ENCOUNTER — Other Ambulatory Visit: Payer: Medicare Other

## 2016-08-22 ENCOUNTER — Other Ambulatory Visit: Payer: Medicare Other

## 2016-08-22 ENCOUNTER — Ambulatory Visit: Payer: Medicare Other | Admitting: Hematology

## 2016-09-08 ENCOUNTER — Encounter: Payer: Self-pay | Admitting: Hematology

## 2016-09-08 ENCOUNTER — Other Ambulatory Visit (HOSPITAL_BASED_OUTPATIENT_CLINIC_OR_DEPARTMENT_OTHER): Payer: Medicare Other

## 2016-09-08 ENCOUNTER — Encounter (INDEPENDENT_AMBULATORY_CARE_PROVIDER_SITE_OTHER): Payer: Self-pay | Admitting: Orthopedic Surgery

## 2016-09-08 ENCOUNTER — Ambulatory Visit (HOSPITAL_BASED_OUTPATIENT_CLINIC_OR_DEPARTMENT_OTHER): Payer: Medicare Other | Admitting: Hematology

## 2016-09-08 ENCOUNTER — Ambulatory Visit (INDEPENDENT_AMBULATORY_CARE_PROVIDER_SITE_OTHER): Payer: Medicare Other | Admitting: Orthopedic Surgery

## 2016-09-08 VITALS — BP 141/67 | HR 65 | Ht 65.0 in | Wt 140.0 lb

## 2016-09-08 VITALS — BP 165/76 | HR 71 | Temp 97.4°F | Resp 18 | Ht 65.5 in | Wt 140.5 lb

## 2016-09-08 DIAGNOSIS — G8929 Other chronic pain: Secondary | ICD-10-CM

## 2016-09-08 DIAGNOSIS — D472 Monoclonal gammopathy: Secondary | ICD-10-CM

## 2016-09-08 DIAGNOSIS — M25561 Pain in right knee: Secondary | ICD-10-CM

## 2016-09-08 DIAGNOSIS — C9000 Multiple myeloma not having achieved remission: Secondary | ICD-10-CM

## 2016-09-08 DIAGNOSIS — M1711 Unilateral primary osteoarthritis, right knee: Secondary | ICD-10-CM | POA: Diagnosis not present

## 2016-09-08 LAB — CBC & DIFF AND RETIC
BASO%: 0.2 % (ref 0.0–2.0)
Basophils Absolute: 0 10*3/uL (ref 0.0–0.1)
EOS%: 0.6 % (ref 0.0–7.0)
Eosinophils Absolute: 0 10*3/uL (ref 0.0–0.5)
HCT: 39.5 % (ref 34.8–46.6)
HGB: 13.8 g/dL (ref 11.6–15.9)
Immature Retic Fract: 3 % (ref 1.60–10.00)
LYMPH%: 40 % (ref 14.0–49.7)
MCH: 32.3 pg (ref 25.1–34.0)
MCHC: 34.9 g/dL (ref 31.5–36.0)
MCV: 92.5 fL (ref 79.5–101.0)
MONO#: 0.2 10*3/uL (ref 0.1–0.9)
MONO%: 3.4 % (ref 0.0–14.0)
NEUT#: 3.5 10*3/uL (ref 1.5–6.5)
NEUT%: 55.8 % (ref 38.4–76.8)
Platelets: 274 10*3/uL (ref 145–400)
RBC: 4.27 10*6/uL (ref 3.70–5.45)
RDW: 12.4 % (ref 11.2–14.5)
Retic %: 1.52 % (ref 0.70–2.10)
Retic Ct Abs: 64.9 10*3/uL (ref 33.70–90.70)
WBC: 6.2 10*3/uL (ref 3.9–10.3)
lymph#: 2.5 10*3/uL (ref 0.9–3.3)

## 2016-09-08 LAB — COMPREHENSIVE METABOLIC PANEL
ALT: 18 U/L (ref 0–55)
AST: 19 U/L (ref 5–34)
Albumin: 3.8 g/dL (ref 3.5–5.0)
Alkaline Phosphatase: 64 U/L (ref 40–150)
Anion Gap: 8 mEq/L (ref 3–11)
BUN: 22.8 mg/dL (ref 7.0–26.0)
CO2: 26 mEq/L (ref 22–29)
Calcium: 9.5 mg/dL (ref 8.4–10.4)
Chloride: 104 mEq/L (ref 98–109)
Creatinine: 1 mg/dL (ref 0.6–1.1)
EGFR: 58 mL/min/{1.73_m2} — ABNORMAL LOW (ref >90)
Glucose: 138 mg/dl (ref 70–140)
Potassium: 4.3 mEq/L (ref 3.5–5.1)
Sodium: 138 mEq/L (ref 136–145)
Total Bilirubin: 0.68 mg/dL (ref 0.20–1.20)
Total Protein: 9.4 g/dL — ABNORMAL HIGH (ref 6.4–8.3)

## 2016-09-08 MED ORDER — DICLOFENAC SODIUM 1 % TD GEL: 2.0000 g | Freq: Four times a day (QID) | TRANSDERMAL | 2 refills | Status: DC

## 2016-09-08 NOTE — Progress Notes (Signed)
Office Visit Note   Patient: Jennifer [name]           Date of Birth: 18-Oct-1946           MRN: [id number] Visit Date: 09/08/2016              Requested by: [name], MD No address on file PCP: [name], MD   Assessment & Plan: Visit Diagnoses:  1. Unilateral primary osteoarthritis, right knee   2. Chronic pain of right knee   3. Primary osteoarthritis of right knee     Plan:  #1: We are going to inject the right knee today with corticosteroid and that was complex atraumatically.  #2: The plan is to give her prescriptions for Voltaren gel which she can use and hopefully get through the pickle ball playoffs. #3 follow back up with [name] when necessary  Follow-Up Instructions: Return if symptoms worsen or fail to improve.   Orders:  Orders Placed This Encounter  Procedures  . Large Joint Injection/Arthrocentesis   Meds ordered this encounter  Medications  . diclofenac sodium (VOLTAREN) 1 % GEL    Sig: Apply 2-4 g topically 4 (four) times daily.    Dispense:  5 Tube    Refill:  2    Order Specific Question:   Supervising Provider    Answer:   Garald Balding [[id number]]      Procedures: Large Joint Inj Date/Time: 09/08/2016 3:35 PM Performed by: Biagio Borg D Authorized by: Biagio Borg D   Consent Given by:  Patient Timeout: prior to procedure the correct patient, procedure, and site was verified   Indications:  Pain and joint swelling Location:  Knee Site:  R knee Prep: patient was prepped and draped in usual sterile fashion   Needle Size:  25 G Needle Length:  1.5 inches Approach:  Anteromedial Ultrasound Guidance: No   Fluoroscopic Guidance: No   Arthrogram: No Medications:  5 mL lidocaine 1 %; 80 mg methylPREDNISolone acetate 40 MG/ML; 3 mL bupivacaine 0.5 % Aspiration Attempted: No   Patient tolerance:  Patient tolerated the procedure well with no immediate complications     Clinical Data: No additional  findings.   Subjective: Chief Complaint  Patient presents with  . Right Knee - Pain, Edema    Pt has had increased pain in her Right Knee that started about 10 days ago. Swelling and pain stays in the Right knee area. Ice/elevation and rest help some but pain returns. Pt would like a cortisone shot in Right Knee.    Review of Systems  Constitutional: Negative.   HENT: Negative.   Eyes: Negative.   Respiratory: Negative.   Cardiovascular: Negative.   Gastrointestinal: Negative.   Endocrine: Negative.   Genitourinary: Negative.   Musculoskeletal: Negative.   Skin: Negative.   Allergic/Immunologic: Negative.   Neurological: Negative.   Hematological: Negative.   Psychiatric/Behavioral: Negative.      Objective: Vital Signs: BP (!) 141/67 (BP Location: Right Arm)   Pulse 65   Ht 5' 5" (1.651 m)   Wt 140 lb (63.5 kg)   LMP 11/07/1996   BMI 23.30 kg/m   Physical Exam  Constitutional: She is oriented to person, place, and time. She appears well-developed and well-nourished.  HENT:  Head: Normocephalic and atraumatic.  Eyes: EOM are normal. Pupils are equal, round, and reactive to light.  Neck:  No carotid bruits  Cardiovascular: Normal rate.   Pulmonary/Chest: Effort normal.  Neurological: She is alert and oriented to  person, place, and time.  Skin: Skin is warm and dry.  Psychiatric: She has a normal mood and affect. Her behavior is normal. Judgment and thought content normal.    Right Knee Exam   Tenderness  The patient is experiencing tenderness in the medial joint line.  Range of Motion  Extension: normal  Flexion: normal   Other  Sensation: normal Pulse: present Swelling: mild      Specialty Comments:  No specialty comments available.  Imaging: No results found.   PMFS History: Patient Active Problem List   Diagnosis Date Noted  . Vitamin D deficiency 12/27/2015  . 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  Hypergammaglobulinemia   . IgG monoclonal gammopathy of uncertain significance   . Abnormal gamma globulin level 04/14/2015  . Hyperlipidemia 04/03/2015  . Hyperglycemia 04/03/2015  . Diverticulosis of colon without hemorrhage 04/03/2015  . Arthralgia of multiple joints 04/03/2015  . DEGENERATIVE JOINT DISEASE 01/20/2010  . SKIN CANCER, HX OF 01/20/2010   Past Medical History:  Diagnosis Date  . Atrial fibrillation ([place]) 12/2010   PMH of ; [place] , [place] [facility]  . Blood transfusion during current hospitalization 5859   complication after childbirth  . Cancer ([place]) 2000   SKIN CANCER IN SCALP  . DJD (degenerative joint disease)   . Skin cancer of face 2015   Patient reports this was likely basal cell on the right side of her face needing 14 stitches.  . Smoldering multiple myeloma ([place]) 2016    Family History  Problem Relation Age of Onset  . Atrial fibrillation Father   . Asthma Brother   . Alzheimer's disease Mother   . Myasthenia gravis Mother     ocular  . Breast cancer Paternal Grandmother   . Depression Daughter   . Colon cancer Neg Hx   . Rectal cancer Neg Hx   . Stomach cancer Neg Hx   . Diabetes Neg Hx   . Stroke Neg Hx   . Heart attack Neg Hx     Past Surgical History:  Procedure Laterality Date  . CHOLECYSTECTOMY  1990's  . COLONOSCOPY  02/2013   negative X 3; Dr Olevia Perches  . G 3 P 1    . SHOULDER SURGERY Right 2012   R ; Dr Durward Fortes for rotator tear  . SHOULDER SURGERY Left 06/2014   Dr. Sydnee Cabal for rotator cuff  . T&A  child  . WISDOM TOOTH EXTRACTION  teenager   Social History   Occupational History  . Not on file.   Social History Main Topics  . Smoking status: Former Smoker    Packs/day: 0.25    Years: 3.00    Quit date: 11/07/1970  . Smokeless tobacco: Never Used     Comment: 1/4 ppd [id number]  . Alcohol use Yes     Comment: Wine 14 glasses/week  . Drug use: No  . Sexual activity: No

## 2016-09-09 ENCOUNTER — Encounter (INDEPENDENT_AMBULATORY_CARE_PROVIDER_SITE_OTHER): Payer: Self-pay | Admitting: Orthopedic Surgery

## 2016-09-09 MED ORDER — METHYLPREDNISOLONE ACETATE 40 MG/ML IJ SUSP
80.0000 mg | INTRAMUSCULAR | Status: AC | PRN
  Administered 2016-09-08: 80 mg

## 2016-09-09 MED ORDER — LIDOCAINE HCL 1 % IJ SOLN
5.0000 mL | INTRAMUSCULAR | Status: AC | PRN
  Administered 2016-09-08: 5 mL

## 2016-09-09 MED ORDER — BUPIVACAINE HCL 0.5 % IJ SOLN
3.0000 mL | INTRAMUSCULAR | Status: AC | PRN
  Administered 2016-09-08: 3 mL via INTRA_ARTICULAR

## 2016-09-12 ENCOUNTER — Telehealth (INDEPENDENT_AMBULATORY_CARE_PROVIDER_SITE_OTHER): Payer: Self-pay | Admitting: Orthopaedic Surgery

## 2016-09-12 LAB — MULTIPLE MYELOMA PANEL, SERUM
Albumin SerPl Elph-Mcnc: 3.9 g/dL (ref 2.9–4.4)
Albumin/Glob SerPl: 0.8 (ref 0.7–1.7)
Alpha 1: 0.3 g/dL (ref 0.0–0.4)
Alpha2 Glob SerPl Elph-Mcnc: 0.8 g/dL (ref 0.4–1.0)
B-Globulin SerPl Elph-Mcnc: 1 g/dL (ref 0.7–1.3)
Gamma Glob SerPl Elph-Mcnc: 2.8 g/dL — ABNORMAL HIGH (ref 0.4–1.8)
Globulin, Total: 4.9 g/dL — ABNORMAL HIGH (ref 2.2–3.9)
IgA, Qn, Serum: 17 mg/dL — ABNORMAL LOW (ref 64–422)
IgG, Qn, Serum: 3257 mg/dL — ABNORMAL HIGH (ref 700–1600)
IgM, Qn, Serum: <5 mg/dL — ABNORMAL LOW (ref 26–217)
M Protein SerPl Elph-Mcnc: 2.6 g/dL — ABNORMAL HIGH
Total Protein: 8.8 g/dL — ABNORMAL HIGH (ref 6.0–8.5)

## 2016-09-12 NOTE — Telephone Encounter (Signed)
Patient called to verify 09/14/16 appointment.

## 2016-09-13 ENCOUNTER — Encounter: Payer: Self-pay | Admitting: Hematology

## 2016-09-14 ENCOUNTER — Encounter (INDEPENDENT_AMBULATORY_CARE_PROVIDER_SITE_OTHER): Payer: Self-pay | Admitting: Orthopaedic Surgery

## 2016-09-14 ENCOUNTER — Encounter: Payer: Self-pay | Admitting: Hematology

## 2016-09-14 ENCOUNTER — Ambulatory Visit (INDEPENDENT_AMBULATORY_CARE_PROVIDER_SITE_OTHER): Payer: Medicare Other | Admitting: Orthopaedic Surgery

## 2016-09-14 VITALS — BP 138/70 | HR 59 | Ht 65.0 in | Wt 144.0 lb

## 2016-09-14 DIAGNOSIS — M25561 Pain in right knee: Secondary | ICD-10-CM

## 2016-09-14 DIAGNOSIS — G8929 Other chronic pain: Secondary | ICD-10-CM

## 2016-09-14 MED ORDER — ACETAMINOPHEN-CODEINE 300-30 MG PO TABS: 1.0000 | ORAL_TABLET | ORAL | 0 refills | Status: DC | PRN

## 2016-09-14 NOTE — Progress Notes (Signed)
Pt states the injection in Right knee on 09/08/16 did not have help with pain. Had to cancel all activities. Today her opposite hip (L) hurts.   Pt taking 2 Aleve a day with no relief.  Livvy was seen by Aaron Edelman last week with a cortisone injection of her right knee. He knows that after about 4 days she felt better only to have recurrence of her knee several days thereafter. She had a significant limp with a feeling of her knee being "tight". "For whatever reason" her knee actually feels better today but she was concerned enough that she wanted to return to the office. Her cortisone injection as well as viscose supplementation and has been on nonsteroidal anti-inflammatory medicines.  Today on exam her right knee was not effused nor was it hot red or swollen. There was no localized tenderness along the medial or lateral joint or the patellofemoral area. She denied groin pain with internal/external rotation there was no superior was no swelling of either lower extremity. She denied any back pain and straight leg raise is negative.  Presently her right knee exam is benign. I would suggest that she continue with the anti-inflammatory medicines. She'll need to ice her knee after physical activity and follow-up for repeat cortisone injections if necessary. She certainly has evidence of osteoarthritis and I believe that's the cause of her recurrent pain.

## 2016-09-17 ENCOUNTER — Encounter: Payer: Self-pay | Admitting: Hematology

## 2016-09-17 ENCOUNTER — Encounter (INDEPENDENT_AMBULATORY_CARE_PROVIDER_SITE_OTHER): Payer: Self-pay | Admitting: Orthopaedic Surgery

## 2016-09-18 ENCOUNTER — Encounter (INDEPENDENT_AMBULATORY_CARE_PROVIDER_SITE_OTHER): Payer: Self-pay | Admitting: Orthopaedic Surgery

## 2016-09-19 NOTE — Progress Notes (Signed)
Jennifer [name]    HEMATOLOGY/ONCOLOGY CONSULTATION NOTE  Date of Service: 09/08/2016    Patient Care Team: [name], MD as PCP - General (Family Medicine)  CHIEF COMPLAINTS/PURPOSE OF CONSULTATION:  Follow up for smoldering multiple myeloma  Diagnosis: Smoldering multiple myeloma  Treatment: Close monitoring  HISTORY OF PRESENTING ILLNESS: please see my initial consultation for details of her initial presentation  INTERVAL HISTORY  Jennifer [name] is here for her scheduled interval follow-up. She notes no acute new concerns. No fevers/chills/ new focal bone pains/new fatigue/unexpected weight loss. Continues to remain very physically active and pursuing her competitive Pickle ball tournaments. No issues with infections. Her blood counts are stable. Her M protein levels are still higher at 2.6. No hypercalcemia or renal failure. She was given a referral to be seen at [facility] for a 2nd opinion and to evaluate if they might have any available clinical trial options and she and her husband eventually choice not to proceed with this. No other acute new concerns.   MEDICAL HISTORY:  Past Medical History:  Diagnosis Date  . Atrial fibrillation ([place]) 12/2010   PMH of ; [place] , [place] [facility]  . Blood transfusion during current hospitalization 9211   complication after childbirth  . Cancer ([place]) 2000   SKIN CANCER IN SCALP  . DJD (degenerative joint disease)   . Skin cancer of face 2015   Patient reports this was likely basal cell on the right side of her face needing 14 stitches.  . Smoldering multiple myeloma ([place]) 2016   . Patient Active Problem List   Diagnosis Date Noted  . Vitamin D deficiency 12/27/2015  . 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 Hypergammaglobulinemia   . IgG monoclonal gammopathy of uncertain significance   . Abnormal gamma globulin level 04/14/2015  . Hyperlipidemia 04/03/2015  . Hyperglycemia 04/03/2015  . Diverticulosis of colon without hemorrhage 04/03/2015    . Arthralgia of multiple joints 04/03/2015  . DEGENERATIVE JOINT DISEASE 01/20/2010  . SKIN CANCER, HX OF 01/20/2010    SURGICAL HISTORY: Past Surgical History:  Procedure Laterality Date  . CHOLECYSTECTOMY  1990's  . COLONOSCOPY  02/2013   negative X 3; Dr Olevia Perches  . G 3 P 1    . SHOULDER SURGERY Right 2012   R ; Dr Durward Fortes for rotator tear  . SHOULDER SURGERY Left 06/2014   Dr. Sydnee Cabal for rotator cuff  . T&A  child  . WISDOM TOOTH EXTRACTION  teenager    SOCIAL HISTORY: Social History   Social History  . Marital status: Married    Spouse name: N/A  . Number of children: N/A  . Years of education: N/A   Occupational History  . Not on file.   Social History Main Topics  . Smoking status: Former Smoker    Packs/day: 0.25    Years: 3.00    Quit date: 11/07/1970  . Smokeless tobacco: Never Used     Comment: 1/4 ppd [id number]  . Alcohol use Yes     Comment: Wine 14 glasses/week  . Drug use: No  . Sexual activity: No   Other Topics Concern  . Not on file   Social History Narrative  . No narrative on file    FAMILY HISTORY: Family History  Problem Relation Age of Onset  . Atrial fibrillation Father   . Asthma Brother   . Alzheimer's disease Mother   . Myasthenia gravis Mother     ocular  . Breast cancer Paternal Grandmother   . Depression  Daughter   . Colon cancer Neg Hx   . Rectal cancer Neg Hx   . Stomach cancer Neg Hx   . Diabetes Neg Hx   . Stroke Neg Hx   . Heart attack Neg Hx     ALLERGIES:  has No Known Allergies.  MEDICATIONS:  Current Outpatient Prescriptions  Medication Sig Dispense Refill  . Acetaminophen-Codeine (TYLENOL/CODEINE #3) 300-30 MG tablet Take 1 tablet by mouth every 4 (four) hours as needed for pain. 30 tablet 0  . diclofenac sodium (VOLTAREN) 1 % GEL Apply 2-4 g topically 4 (four) times daily. 5 Tube 2  . Naproxen Sodium (ALEVE) 220 MG CAPS Take 2 capsules by mouth as needed.      No current facility-administered  medications for this visit.     REVIEW OF SYSTEMS:    10 Point review of Systems was done is negative except as noted above.  PHYSICAL EXAMINATION: ECOG PERFORMANCE STATUS: 0-1  . Vitals:   09/08/16 1418  Weight: 140 lb 8 oz (63.7 kg)  Height: 5' 5.5" (1.664 m)   Filed Weights   09/08/16 1418  Weight: 140 lb 8 oz (63.7 kg)   .Body mass index is 23.02 kg/m.  GENERAL:alert, in no acute distress and comfortable SKIN: skin color, texture, turgor are normal, no rashes or significant lesions EYES: normal, conjunctiva are pink and non-injected, sclera clear OROPHARYNX:no exudate, no erythema and lips, buccal mucosa, and tongue normal  NECK: supple, no JVD, thyroid normal size, non-tender, without nodularity LYMPH:  no palpable lymphadenopathy in the cervical, axillary or inguinal LUNGS: clear to auscultation with normal respiratory effort HEART: regular rate & rhythm,  no murmurs and no lower extremity edema ABDOMEN: abdomen soft, non-tender, normoactive bowel sounds  Musculoskeletal: no cyanosis of digits and no clubbing  PSYCH: alert & oriented x 3 with fluent speech NEURO: no focal motor/sensory deficits  LABORATORY DATA:  I have reviewed the data as listed  . CBC Latest Ref Rng & Units 09/08/2016 04/18/2016 12/22/2015  WBC 3.9 - 10.3 10e3/uL 6.2 7.6 6.5  Hemoglobin 11.6 - 15.9 g/dL 13.8 12.8 14.0  Hematocrit 34.8 - 46.6 % 39.5 36.8 39.7  Platelets 145 - 400 10e3/uL 274 245 259    . CMP Latest Ref Rng & Units 09/08/2016 09/08/2016 04/18/2016  Glucose 70 - 140 mg/dl 138 - 88  BUN 7.0 - 26.0 mg/dL 22.8 - 21.8  Creatinine 0.6 - 1.1 mg/dL 1.0 - 0.9  Sodium 136 - 145 mEq/L 138 - 138  Potassium 3.5 - 5.1 mEq/L 4.3 - 4.2  Chloride 96 - 112 mEq/L - - -  CO2 22 - 29 mEq/L 26 - 25  Calcium 8.4 - 10.4 mg/dL 9.5 - 9.4  Total Protein 6.0 - 8.5 g/dL 9.4(H) 8.8(H) 8.6(H)  Total Bilirubin 0.20 - 1.20 mg/dL 0.68 - 0.57  Alkaline Phos 40 - 150 U/L 64 - 61  AST 5 - 34 U/L 19 - 22  ALT  0 - 55 U/L 18 - 22     RADIOGRAPHIC STUDIES: I have personally reviewed the radiological images as listed and agreed with the findings in the report. No results found. Normal DEXA scan in June 2015     Cytogenetic analysis: Revealed the presence of a normal female chromosomes with no observable clonal chromosomal abnormalities.   ASSESSMENT & PLAN:   70 year old Caucasian female in good overall health with  #1Smoldering Multiple Myeloma IgG lambda. Bone marrow plasma cells more than 10% ( noted to have 35% clonal  plasma cells] Normal female karyotype cytogenetics with no other abnormalities SPEP shows M spike of 2.6 (no dramatic significant increase) with IFE showing IgG lambda paraprotein. Associated with some Immunoparesis characterized by associated decrease in IgA and IgM.  M protein 2.6--> 2.6---> 2.2--->2.4--->2.3--> 2.6  SFLC slightly increased but ratio still ok   Patient on follow-up today is feeling great and has no acute new clinical concerns. Labs show no evidence of anemia, renal failure. No hypercalcemia . No new bone pains. Total protein level stable. No issues with infections.  Plan -no overt indication for treatment of Jennifer [name]'s SMM -she was provided a referral to see Dr. Cameron Sprang at [facility] for a 2nd opinion based on her request but decided against going for this. -given some increase in M protein levels and IgG will f/u with rpt labs in 45month with rpt Skeletal survey. -will hold off on rpt BM Bx in the absence of significant new clinical concerns.  -Return to care with Dr KIrene Limboin 3 months with repeat myeloma labs, cbc, cmp, K/L FLC and Bone survey (labs and X-ray preferable 5-7 days prior to clinic visit)  All off the patients questions were answered to her apparent satisfaction. The patient knows to call the clinic with any problems, questions or concerns.  I spent 20 minutes counseling the patient face to face. The total time spent in the  appointment was 20 minutes and more than 50% was on counseling and direct patient cares.    GSullivan LoneMD MCamptonAAHIVMS SWarm Springs Rehabilitation Hospital Of Thousand OaksCShriners Hospital For ChildrenWHarsha Behavioral Center IncHematology/Oncology Physician CWylie (Office):       [phone](Work cell):  [phone](Fax):           [phone]

## 2016-09-21 ENCOUNTER — Telehealth: Payer: Self-pay | Admitting: General Practice

## 2016-09-21 NOTE — Telephone Encounter (Signed)
Spoke w/pt confirmed February 2018 appt.

## 2016-09-24 ENCOUNTER — Encounter (INDEPENDENT_AMBULATORY_CARE_PROVIDER_SITE_OTHER): Payer: Self-pay | Admitting: Orthopaedic Surgery

## 2016-09-26 ENCOUNTER — Telehealth (INDEPENDENT_AMBULATORY_CARE_PROVIDER_SITE_OTHER): Payer: Self-pay

## 2016-09-26 NOTE — Telephone Encounter (Signed)
Called Jennifer [name] and she will call Judeen Hammans.

## 2016-10-06 DIAGNOSIS — M1711 Unilateral primary osteoarthritis, right knee: Secondary | ICD-10-CM | POA: Diagnosis not present

## 2016-10-06 DIAGNOSIS — M23331 Other meniscus derangements, other medial meniscus, right knee: Secondary | ICD-10-CM

## 2016-10-10 ENCOUNTER — Encounter (INDEPENDENT_AMBULATORY_CARE_PROVIDER_SITE_OTHER): Payer: Self-pay | Admitting: Orthopaedic Surgery

## 2016-10-10 ENCOUNTER — Ambulatory Visit (INDEPENDENT_AMBULATORY_CARE_PROVIDER_SITE_OTHER): Payer: Medicare Other | Admitting: Orthopaedic Surgery

## 2016-10-10 VITALS — Ht 67.0 in | Wt 140.0 lb

## 2016-10-10 DIAGNOSIS — M25561 Pain in right knee: Secondary | ICD-10-CM

## 2016-10-10 DIAGNOSIS — G8929 Other chronic pain: Secondary | ICD-10-CM

## 2016-10-10 NOTE — Progress Notes (Signed)
Office Visit Note   Patient: Jennifer [name]           Date of Birth: [birth year]           MRN: [id number] Visit Date: 10/10/2016              Requested by: [name], MD No address on file PCP: [name], MD   Assessment & Plan: Visit Diagnoses: No diagnosis found.  Plan: Follow-up in 2 weeks. There is no evidence of DVT or infection.  Follow-Up Instructions: No Follow-up on file.   Orders:  No orders of the defined types were placed in this encounter.  No orders of the defined types were placed in this encounter.     Procedures: No procedures performed   Clinical Data: No additional findings.   Subjective: No chief complaint on file.   Patient post op of Right knee arthroscopy.   Very little pain, slightly swollen, no bruising  Pt taking pain meds PRN  Cambrea had right knee arthroscopy 4 days ago. She had a tear of both the medial lateral menisci associated with tricompartmental degenerative changes predominantly at the patellofemoral joint  Review of Systems   Objective: Vital Signs: LMP 11/07/1996   Physical Exam  Ortho Exam no calf pain or popliteal discomfort. Very small effusion. The puncture sites are healing very nicely.  Specialty Comments:  No specialty comments available.  Imaging: No results found.   PMFS History: Patient Active Problem List   Diagnosis Date Noted  . Vitamin D deficiency 12/27/2015  . 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 Hypergammaglobulinemia   . IgG monoclonal gammopathy of uncertain significance   . Abnormal gamma globulin level 04/14/2015  . Hyperlipidemia 04/03/2015  . Hyperglycemia 04/03/2015  . Diverticulosis of colon without hemorrhage 04/03/2015  . Arthralgia of multiple joints 04/03/2015  . DEGENERATIVE JOINT DISEASE 01/20/2010  . SKIN CANCER, HX OF 01/20/2010   Past Medical History:  Diagnosis Date  . Atrial fibrillation ([place]) 12/2010   PMH of ; [place] , [place] [facility]  . Blood  transfusion during current hospitalization 9702   complication after childbirth  . Cancer ([place]) 2000   SKIN CANCER IN SCALP  . DJD (degenerative joint disease)   . Skin cancer of face 2015   Patient reports this was likely basal cell on the right side of her face needing 14 stitches.  . Smoldering multiple myeloma ([place]) 2016    Family History  Problem Relation Age of Onset  . Atrial fibrillation Father   . Asthma Brother   . Alzheimer's disease Mother   . Myasthenia gravis Mother     ocular  . Breast cancer Paternal Grandmother   . Depression Daughter   . Colon cancer Neg Hx   . Rectal cancer Neg Hx   . Stomach cancer Neg Hx   . Diabetes Neg Hx   . Stroke Neg Hx   . Heart attack Neg Hx     Past Surgical History:  Procedure Laterality Date  . CHOLECYSTECTOMY  1990's  . COLONOSCOPY  02/2013   negative X 3; Dr Olevia Perches  . G 3 P 1    . SHOULDER SURGERY Right 2012   R ; Dr Durward Fortes for rotator tear  . SHOULDER SURGERY Left 06/2014   Dr. Sydnee Cabal for rotator cuff  . T&A  child  . WISDOM TOOTH EXTRACTION  teenager   Social History   Occupational History  . Not on file.   Social History Main Topics  .  Smoking status: Former Smoker    Packs/day: 0.25    Years: 3.00    Quit date: 11/07/1970  . Smokeless tobacco: Never Used     Comment: 1/4 ppd [id number]  . Alcohol use Yes     Comment: Wine 14 glasses/week  . Drug use: No  . Sexual activity: No

## 2016-10-14 ENCOUNTER — Inpatient Hospital Stay (INDEPENDENT_AMBULATORY_CARE_PROVIDER_SITE_OTHER): Payer: Medicare Other | Admitting: Orthopaedic Surgery

## 2016-11-07 HISTORY — PX: HAMMER TOE SURGERY: SHX385

## 2016-11-16 ENCOUNTER — Encounter (INDEPENDENT_AMBULATORY_CARE_PROVIDER_SITE_OTHER): Payer: Self-pay | Admitting: Orthopaedic Surgery

## 2016-11-16 ENCOUNTER — Ambulatory Visit (INDEPENDENT_AMBULATORY_CARE_PROVIDER_SITE_OTHER): Payer: Medicare Other | Admitting: Orthopaedic Surgery

## 2016-11-16 ENCOUNTER — Other Ambulatory Visit (INDEPENDENT_AMBULATORY_CARE_PROVIDER_SITE_OTHER): Payer: Self-pay

## 2016-11-16 VITALS — BP 125/73 | HR 75 | Ht 65.0 in | Wt 140.0 lb

## 2016-11-16 DIAGNOSIS — G8929 Other chronic pain: Secondary | ICD-10-CM

## 2016-11-16 DIAGNOSIS — M25561 Pain in right knee: Secondary | ICD-10-CM

## 2016-11-16 MED ORDER — DICLOFENAC SODIUM 1 % TD GEL: 2.0000 g | Freq: Four times a day (QID) | TRANSDERMAL | 2 refills | Status: DC

## 2016-11-16 NOTE — Progress Notes (Signed)
Office Visit Note   Patient: Jennifer [name]           Date of Birth: 1946-01-25           MRN: [id number] Visit Date: 11/16/2016              Requested by: Jennifer Gearing, MD No address on file PCP: Jennifer Gearing, MD   Assessment & Plan: Visit Diagnoses: 6 weeks status post right knee arthroscopy with partial medial and lateral meniscectomies. Tricompartmental osteoarthritis identified predominantly about the patellofemoral joint. Presently doing well. Given her diagnosis I think her present course is expected and normal.  Plan: Prescription for Voltaren gel,re view of appropriate exercises and use of anti-inflammatory medicines. Follow-up as needed. Consider cortisone injections in future  Follow-Up Instructions: No Follow-up on file.   Orders:  No orders of the defined types were placed in this encounter.  No orders of the defined types were placed in this encounter.     Procedures: No procedures performed   Clinical Data: No additional findings.   Subjective: No chief complaint on file.   Pt is starting to feel better and do more intense exercises, post 6 weeks Right knee arthroscopy  Right arch swelling  Pt wants to be able to straighten leg  Jennifer [name] relates that in the past week her knee is feeling much better. She is experiencing less swelling and achiness and soreness and wishes to get back into playing pickle ball. She has been through a course of physical therapy and feels that her knee is a little "straighter" she is working on strengthening exercises.  Review of Systems   Objective: Vital Signs: LMP 11/07/1996   Physical Exam  Ortho Exam minimal effusion right knee. No evidence of instability. No calf pain. No swelling distally. Neurovascular exam intact. Minimal patellar crepitation. No limp.:  No specialty comments available.  Imaging: No results found.   PMFS History: Patient Active Problem List   Diagnosis Date Noted  . Vitamin D  deficiency 12/27/2015  . Smoldering multiple myeloma ([place]) 12/22/2015  . Hypergammaglobulinemia   . IgG monoclonal gammopathy of uncertain significance   . Abnormal gamma globulin level 04/14/2015  . Hyperlipidemia 04/03/2015  . Hyperglycemia 04/03/2015  . Diverticulosis of colon without hemorrhage 04/03/2015  . Arthralgia of multiple joints 04/03/2015  . DEGENERATIVE JOINT DISEASE 01/20/2010  . SKIN CANCER, HX OF 01/20/2010   Past Medical History:  Diagnosis Date  . Atrial fibrillation ([place]) 12/2010   PMH of ; [place] , [place] [facility]  . Blood transfusion during current hospitalization 1245   complication after childbirth  . Cancer ([place]) 2000   SKIN CANCER IN SCALP  . DJD (degenerative joint disease)   . Skin cancer of face 2015   Patient reports this was likely basal cell on the right side of her face needing 14 stitches.  . Smoldering multiple myeloma ([place]) 2016    Family History  Problem Relation Age of Onset  . Atrial fibrillation Father   . Asthma Brother   . Alzheimer's disease Mother   . Myasthenia gravis Mother     ocular  . Breast cancer Paternal Grandmother   . Depression Daughter   . Colon cancer Neg Hx   . Rectal cancer Neg Hx   . Stomach cancer Neg Hx   . Diabetes Neg Hx   . Stroke Neg Hx   . Heart attack Neg Hx     Past Surgical History:  Procedure Laterality Date  . CHOLECYSTECTOMY  1990's  .  COLONOSCOPY  02/2013   negative X 3; Dr Jennifer [name]  . G 3 P 1    . SHOULDER SURGERY Right 2012   R ; Dr Jennifer [name] for rotator tear  . SHOULDER SURGERY Left 06/2014   Dr. Sydnee [name] for rotator cuff  . T&A  child  . WISDOM TOOTH EXTRACTION  teenager   Social History   Occupational History  . Not on file.   Social History Main Topics  . Smoking status: Former Smoker    Packs/day: 0.25    Years: 3.00    Quit date: 11/07/1970  . Smokeless tobacco: Never Used     Comment: 1/4 ppd [id number]  . Alcohol use Yes     Comment: Wine 14 glasses/week  . Drug use: No  . Sexual  activity: No

## 2016-12-09 ENCOUNTER — Other Ambulatory Visit: Payer: Medicare Other

## 2016-12-09 ENCOUNTER — Ambulatory Visit (HOSPITAL_COMMUNITY)
Admission: RE | Admit: 2016-12-09 | Discharge: 2016-12-09 | Disposition: A | Discharge status: Home / Self Care | Payer: Medicare Other | Source: Ambulatory Visit | Attending: Hematology | Admitting: Hematology

## 2016-12-09 DIAGNOSIS — C9000 Multiple myeloma not having achieved remission: Secondary | ICD-10-CM | POA: Insufficient documentation

## 2016-12-09 DIAGNOSIS — D472 Monoclonal gammopathy: Secondary | ICD-10-CM

## 2016-12-09 DIAGNOSIS — Z1231 Encounter for screening mammogram for malignant neoplasm of breast: Secondary | ICD-10-CM | POA: Diagnosis not present

## 2016-12-15 ENCOUNTER — Ambulatory Visit (HOSPITAL_BASED_OUTPATIENT_CLINIC_OR_DEPARTMENT_OTHER): Payer: Medicare Other | Admitting: Hematology

## 2016-12-15 ENCOUNTER — Other Ambulatory Visit (HOSPITAL_BASED_OUTPATIENT_CLINIC_OR_DEPARTMENT_OTHER): Payer: Medicare Other

## 2016-12-15 ENCOUNTER — Telehealth: Payer: Self-pay | Admitting: Hematology

## 2016-12-15 ENCOUNTER — Encounter: Payer: Self-pay | Admitting: Hematology

## 2016-12-15 VITALS — BP 153/95 | HR 59 | Temp 97.5°F | Resp 18 | Ht 65.0 in | Wt 144.8 lb

## 2016-12-15 DIAGNOSIS — C9000 Multiple myeloma not having achieved remission: Secondary | ICD-10-CM | POA: Diagnosis not present

## 2016-12-15 DIAGNOSIS — D472 Monoclonal gammopathy: Secondary | ICD-10-CM

## 2016-12-15 LAB — CBC & DIFF AND RETIC
BASO%: 0.1 % (ref 0.0–2.0)
Basophils Absolute: 0 10*3/uL (ref 0.0–0.1)
EOS%: 1.2 % (ref 0.0–7.0)
Eosinophils Absolute: 0.1 10*3/uL (ref 0.0–0.5)
HCT: 36.2 % (ref 34.8–46.6)
HGB: 12.2 g/dL (ref 11.6–15.9)
Immature Retic Fract: 2.5 % (ref 1.60–10.00)
LYMPH%: 34.6 % (ref 14.0–49.7)
MCH: 31.6 pg (ref 25.1–34.0)
MCHC: 33.7 g/dL (ref 31.5–36.0)
MCV: 93.8 fL (ref 79.5–101.0)
MONO#: 0.2 10*3/uL (ref 0.1–0.9)
MONO%: 3.6 % (ref 0.0–14.0)
NEUT#: 4.1 10*3/uL (ref 1.5–6.5)
NEUT%: 60.5 % (ref 38.4–76.8)
Platelets: 242 10*3/uL (ref 145–400)
RBC: 3.86 10*6/uL (ref 3.70–5.45)
RDW: 12.7 % (ref 11.2–14.5)
Retic %: 1.31 % (ref 0.70–2.10)
Retic Ct Abs: 50.57 10*3/uL (ref 33.70–90.70)
WBC: 6.7 10*3/uL (ref 3.9–10.3)
lymph#: 2.3 10*3/uL (ref 0.9–3.3)

## 2016-12-15 LAB — COMPREHENSIVE METABOLIC PANEL
ALT: 20 U/L (ref 0–55)
AST: 17 U/L (ref 5–34)
Albumin: 3.7 g/dL (ref 3.5–5.0)
Alkaline Phosphatase: 64 U/L (ref 40–150)
Anion Gap: 8 mEq/L (ref 3–11)
BUN: 21 mg/dL (ref 7.0–26.0)
CO2: 24 mEq/L (ref 22–29)
Calcium: 9.4 mg/dL (ref 8.4–10.4)
Chloride: 106 mEq/L (ref 98–109)
Creatinine: 0.9 mg/dL (ref 0.6–1.1)
EGFR: 67 mL/min/{1.73_m2} — ABNORMAL LOW (ref >90)
Glucose: 122 mg/dl (ref 70–140)
Potassium: 4 mEq/L (ref 3.5–5.1)
Sodium: 138 mEq/L (ref 136–145)
Total Bilirubin: 0.53 mg/dL (ref 0.20–1.20)
Total Protein: 8.9 g/dL — ABNORMAL HIGH (ref 6.4–8.3)

## 2016-12-15 LAB — LACTATE DEHYDROGENASE: LDH: 161 U/L (ref 125–245)

## 2016-12-15 NOTE — Telephone Encounter (Signed)
Pt confirmed appt and received avs °

## 2016-12-16 LAB — KAPPA/LAMBDA LIGHT CHAINS
Ig Kappa Free Light Chain: 6.2 mg/L (ref 3.3–19.4)
Ig Lambda Free Light Chain: 13.7 mg/L (ref 5.7–26.3)
Kappa/Lambda FluidC Ratio: 0.45 (ref 0.26–1.65)

## 2016-12-16 LAB — BETA 2 MICROGLOBULIN, SERUM: Beta-2: 2.9 mg/L — ABNORMAL HIGH (ref 0.6–2.4)

## 2016-12-18 NOTE — Progress Notes (Signed)
Marland Kitchen    HEMATOLOGY/ONCOLOGY CONSULTATION NOTE  Date of Service: 12/15/2016    Patient Care Team: [name], MD as PCP - General (Family Medicine)  CHIEF COMPLAINTS/PURPOSE OF CONSULTATION:  Follow up for smoldering multiple myeloma  Diagnosis: Smoldering multiple myeloma  Treatment: Close monitoring  HISTORY OF PRESENTING ILLNESS: please see my initial consultation for details of her initial presentation  INTERVAL HISTORY  Ms Suire is here for her scheduled interval follow-up. She notes some knee and ankle pain related to arthritis. She still continues to actively play pickle ball. Notes no acute new symptoms. No focal back pains or headaches. Had a bone scan today which show no focal lesions. No significant anemia, change in renal function or hypercalcemia. Some increase in her beta-2 microglobulin. No new fatigue. Feels well overall.  MEDICAL HISTORY:  Past Medical History:  Diagnosis Date  . Atrial fibrillation ([place]) 12/2010   PMH of ; [place] , [place] [facility]  . Blood transfusion during current hospitalization 8341   complication after childbirth  . Cancer ([place]) 2000   SKIN CANCER IN [place]ALP  . DJD (degenerative joint disease)   . Skin cancer of face 2015   Patient reports this was likely basal cell on the right side of her face needing 14 stitches.  . Smoldering multiple myeloma ([place]) 2016   . Patient Active Problem List   Diagnosis Date Noted  . Vitamin D deficiency 12/27/2015  . Smoldering multiple myeloma ([place]) 12/22/2015  . Hypergammaglobulinemia   . IgG monoclonal gammopathy of uncertain significance   . Abnormal gamma globulin level 04/14/2015  . Hyperlipidemia 04/03/2015  . Hyperglycemia 04/03/2015  . Diverticulosis of colon without hemorrhage 04/03/2015  . Arthralgia of multiple joints 04/03/2015  . DEGENERATIVE JOINT DISEASE 01/20/2010  . SKIN CANCER, HX OF 01/20/2010    SURGICAL HISTORY: Past Surgical History:  Procedure Laterality Date  .  CHOLECYSTECTOMY  1990's  . COLONOSCOPY  02/2013   negative X 3; Dr Olevia Perches  . G 3 P 1    . SHOULDER SURGERY Right 2012   R ; Dr Durward Fortes for rotator tear  . SHOULDER SURGERY Left 06/2014   Dr. Sydnee Cabal for rotator cuff  . T&A  child  . WISDOM TOOTH EXTRACTION  teenager    SOCIAL HISTORY: Social History   Social History  . Marital status: Married    Spouse name: N/A  . Number of children: N/A  . Years of education: N/A   Occupational History  . Not on file.   Social History Main Topics  . Smoking status: Former Smoker    Packs/day: 0.25    Years: 3.00    Quit date: 11/07/1970  . Smokeless tobacco: Never Used     Comment: 1/4 ppd [id number]  . Alcohol use Yes     Comment: Wine 14 glasses/week  . Drug use: No  . Sexual activity: No   Other Topics Concern  . Not on file   Social History Narrative  . No narrative on file    FAMILY HISTORY: Family History  Problem Relation Age of Onset  . Atrial fibrillation Father   . Asthma Brother   . Alzheimer's disease Mother   . Myasthenia gravis Mother     ocular  . Breast cancer Paternal Grandmother   . Depression Daughter   . Colon cancer Neg Hx   . Rectal cancer Neg Hx   . Stomach cancer Neg Hx   . Diabetes Neg Hx   . Stroke Neg Hx   .  Heart attack Neg Hx     ALLERGIES:  has No Known Allergies.  MEDICATIONS:  Current Outpatient Prescriptions  Medication Sig Dispense Refill  . diclofenac sodium (VOLTAREN) 1 % GEL Apply 2-4 g topically 4 (four) times daily. 5 Tube 2  . Naproxen Sodium (ALEVE) 220 MG CAPS Take 2 capsules by mouth as needed.      No current facility-administered medications for this visit.     REVIEW OF SYSTEMS:    10 Point review of Systems was done is negative except as noted above.  PHYSICAL EXAMINATION: ECOG PERFORMANCE STATUS: 0-1  . Vitals:   12/15/16 1317  Weight: 144 lb 12.8 oz (65.7 kg)  Height: '5\' 5"'$  (1.651 m)   Filed Weights   12/15/16 1317  Weight: 144 lb 12.8 oz (65.7 kg)     .Body mass index is 24.1 kg/m.  GENERAL:alert, in no acute distress and comfortable SKIN: skin color, texture, turgor are normal, no rashes or significant lesions EYES: normal, conjunctiva are pink and non-injected, sclera clear OROPHARYNX:no exudate, no erythema and lips, buccal mucosa, and tongue normal  NECK: supple, no JVD, thyroid normal size, non-tender, without nodularity LYMPH:  no palpable lymphadenopathy in the cervical, axillary or inguinal LUNGS: clear to auscultation with normal respiratory effort HEART: regular rate & rhythm,  no murmurs and no lower extremity edema ABDOMEN: abdomen soft, non-tender, normoactive bowel sounds  Musculoskeletal: no cyanosis of digits and no clubbing  PSYCH: alert & oriented x 3 with fluent speech NEURO: no focal motor/sensory deficits  LABORATORY DATA:  I have reviewed the data as listed  . CBC Latest Ref Rng & Units 12/15/2016 09/08/2016 04/18/2016  WBC 3.9 - 10.3 10e3/uL 6.7 6.2 7.6  Hemoglobin 11.6 - 15.9 g/dL 12.2 13.8 12.8  Hematocrit 34.8 - 46.6 % 36.2 39.5 36.8  Platelets 145 - 400 10e3/uL 242 274 245    . CMP Latest Ref Rng & Units 12/15/2016 09/08/2016 09/08/2016  Glucose 70 - 140 mg/dl 122 138 -  BUN 7.0 - 26.0 mg/dL 21.0 22.8 -  Creatinine 0.6 - 1.1 mg/dL 0.9 1.0 -  Sodium 136 - 145 mEq/L 138 138 -  Potassium 3.5 - 5.1 mEq/L 4.0 4.3 -  Chloride 96 - 112 mEq/L - - -  CO2 22 - 29 mEq/L 24 26 -  Calcium 8.4 - 10.4 mg/dL 9.4 9.5 -  Total Protein 6.4 - 8.3 g/dL 8.9(H) 9.4(H) 8.8(H)  Total Bilirubin 0.20 - 1.20 mg/dL 0.53 0.68 -  Alkaline Phos 40 - 150 U/L 64 64 -  AST 5 - 34 U/L 17 19 -  ALT 0 - 55 U/L 20 18 -    Component     Latest Ref Rng & Units 12/15/2016  Ig Kappa Free Light Chain     3.3 - 19.4 mg/L 6.2  Ig Lambda Free Light Chain     5.7 - 26.3 mg/L 13.7  Kappa/Lambda FluidC Ratio     0.26 - 1.65 0.45  Beta 2     0.6 - 2.4 mg/L 2.9 (H)   RADIOGRAPHIC STUDIES: I have personally reviewed the radiological images  as listed and agreed with the findings in the report. Dg Bone Survey Met  Result Date: 12/09/2016 CLINICAL DATA:  Staging for myeloma. EXAM: METASTATIC COMPARISON:  None. FINDINGS: Lytic lesions:  None Alignment: There is a scoliosis deformity involving the thoracic and lumbar spine. No evidence of fracture or dislocation. Mineralization:  Normal Joints: There is mild bilateral hip osteoarthritis. Moderate bilateral osteoarthritis involves  the knees. Degenerative disc disease: Degenerative disc disease is identified throughout the cervical and lumbar spine. Other:  none IMPRESSION: 1. Negative examination.  No evidence of multiple myeloma. Electronically Signed   By: Kerby Moors M.D.   On: 12/09/2016 14:10   Normal DEXA scan in June 2015     Cytogenetic analysis: Revealed the presence of a normal female chromosomes with no observable clonal chromosomal abnormalities.   ASSESSMENT & PLAN:   71 year old Caucasian female in good overall health with  #1Smoldering Multiple Myeloma IgG lambda. Bone marrow plasma cells more than 10% ( noted to have 35% clonal plasma cells] Normal female karyotype cytogenetics with no other abnormalities SPEP shows M spike of 2.6 (no dramatic significant increase) with IFE showing IgG lambda paraprotein. Associated with some Immunoparesis characterized by associated decrease in IgA and IgM.  M protein 2.6--> 2.6---> 2.2--->2.4--->2.3--> 2.6  SFLC K/L ratio low nl. Increased B2Microglobulin Bone survey: 12/09/2016 -- shows no evidence of bone lesions.  Patient on follow-up today is feeling good and has no new focal symptoms or fatigue. total protein level stable. No issues with infections.  Plan -no overt indication for treatment of Mrs Lesch's SMM at this time. -awaiting SPEP results -will f/u. -No overt anemia, renal insufficiency, hypercalcemia or bone lesions on her body survey to suggest overt progression of disease. -Her beta-2 microglobulin is  somewhat elevated and might suggest increased plasma cell mass. -will hold off on rpt BM Bx in the absence of significant new clinical concerns or significant increase in M protein levels.  -Return to care with Dr Irene Limbo in 3 months with repeat myeloma labs, cbc, cmp, K/L FLC   All off the patients questions were answered to her apparent satisfaction. The patient knows to call the clinic with any problems, questions or concerns.  I spent 20 minutes counseling the patient face to face. The total time spent in the appointment was 20 minutes and more than 50% was on counseling and direct patient cares.    Sullivan Lone MD [place] AAHIVMS [facility] Hematology/Oncology Physician [place]  (Office):       [phone] (Work cell):  [phone] (Fax):           [phone]

## 2016-12-20 LAB — MULTIPLE MYELOMA PANEL, SERUM
Albumin SerPl Elph-Mcnc: 3.9 g/dL (ref 2.9–4.4)
Albumin/Glob SerPl: 0.9 (ref 0.7–1.7)
Alpha 1: 0.3 g/dL (ref 0.0–0.4)
Alpha2 Glob SerPl Elph-Mcnc: 0.7 g/dL (ref 0.4–1.0)
B-Globulin SerPl Elph-Mcnc: 0.9 g/dL (ref 0.7–1.3)
Gamma Glob SerPl Elph-Mcnc: 2.8 g/dL — ABNORMAL HIGH (ref 0.4–1.8)
Globulin, Total: 4.6 g/dL — ABNORMAL HIGH (ref 2.2–3.9)
IgA, Qn, Serum: 17 mg/dL — ABNORMAL LOW (ref 64–422)
IgG, Qn, Serum: 3343 mg/dL — ABNORMAL HIGH (ref 700–1600)
IgM, Qn, Serum: 5 mg/dL — ABNORMAL LOW (ref 26–217)
M Protein SerPl Elph-Mcnc: 2.5 g/dL — ABNORMAL HIGH
Total Protein: 8.5 g/dL (ref 6.0–8.5)

## 2017-02-13 ENCOUNTER — Encounter: Payer: Self-pay | Admitting: Hematology

## 2017-02-16 ENCOUNTER — Telehealth: Payer: Self-pay | Admitting: Hematology

## 2017-02-16 NOTE — Telephone Encounter (Signed)
Confirmed appt r/s on 5/14 to 5/21 patient is aware of appt date and time.

## 2017-02-16 NOTE — Telephone Encounter (Signed)
I cancelled patient appointment on 5/14 and she would like to move it to 5/21 @ 2pm

## 2017-03-06 ENCOUNTER — Other Ambulatory Visit (HOSPITAL_BASED_OUTPATIENT_CLINIC_OR_DEPARTMENT_OTHER): Payer: Medicare Other

## 2017-03-06 DIAGNOSIS — C9000 Multiple myeloma not having achieved remission: Secondary | ICD-10-CM | POA: Diagnosis not present

## 2017-03-06 DIAGNOSIS — D472 Monoclonal gammopathy: Secondary | ICD-10-CM

## 2017-03-06 LAB — CBC & DIFF AND RETIC
BASO%: 0.4 % (ref 0.0–2.0)
Basophils Absolute: 0 10*3/uL (ref 0.0–0.1)
EOS%: 1.4 % (ref 0.0–7.0)
Eosinophils Absolute: 0.1 10*3/uL (ref 0.0–0.5)
HCT: 37 % (ref 34.8–46.6)
HGB: 12.7 g/dL (ref 11.6–15.9)
Immature Retic Fract: 2 % (ref 1.60–10.00)
LYMPH%: 42.1 % (ref 14.0–49.7)
MCH: 32 pg (ref 25.1–34.0)
MCHC: 34.3 g/dL (ref 31.5–36.0)
MCV: 93.2 fL (ref 79.5–101.0)
MONO#: 0.2 10*3/uL (ref 0.1–0.9)
MONO%: 3.5 % (ref 0.0–14.0)
NEUT#: 2.6 10*3/uL (ref 1.5–6.5)
NEUT%: 52.6 % (ref 38.4–76.8)
Platelets: 261 10*3/uL (ref 145–400)
RBC: 3.97 10*6/uL (ref 3.70–5.45)
RDW: 12.5 % (ref 11.2–14.5)
Retic %: 1.29 % (ref 0.70–2.10)
Retic Ct Abs: 51.21 10*3/uL (ref 33.70–90.70)
WBC: 4.9 10*3/uL (ref 3.9–10.3)
lymph#: 2.1 10*3/uL (ref 0.9–3.3)

## 2017-03-06 LAB — COMPREHENSIVE METABOLIC PANEL
ALT: 19 U/L (ref 0–55)
AST: 20 U/L (ref 5–34)
Albumin: 3.9 g/dL (ref 3.5–5.0)
Alkaline Phosphatase: 55 U/L (ref 40–150)
Anion Gap: 9 mEq/L (ref 3–11)
BUN: 22.5 mg/dL (ref 7.0–26.0)
CO2: 23 mEq/L (ref 22–29)
Calcium: 9.3 mg/dL (ref 8.4–10.4)
Chloride: 107 mEq/L (ref 98–109)
Creatinine: 0.9 mg/dL (ref 0.6–1.1)
EGFR: 65 mL/min/{1.73_m2} — ABNORMAL LOW (ref >90)
Glucose: 96 mg/dl (ref 70–140)
Potassium: 4.4 mEq/L (ref 3.5–5.1)
Sodium: 139 mEq/L (ref 136–145)
Total Bilirubin: 0.67 mg/dL (ref 0.20–1.20)
Total Protein: 9 g/dL — ABNORMAL HIGH (ref 6.4–8.3)

## 2017-03-07 LAB — KAPPA/LAMBDA LIGHT CHAINS
Ig Kappa Free Light Chain: 5.2 mg/L (ref 3.3–19.4)
Ig Lambda Free Light Chain: 11.6 mg/L (ref 5.7–26.3)
Kappa/Lambda FluidC Ratio: 0.45 (ref 0.26–1.65)

## 2017-03-10 LAB — MULTIPLE MYELOMA PANEL, SERUM
Albumin SerPl Elph-Mcnc: 3.9 g/dL (ref 2.9–4.4)
Albumin/Glob SerPl: 0.9 (ref 0.7–1.7)
Alpha 1: 0.2 g/dL (ref 0.0–0.4)
Alpha2 Glob SerPl Elph-Mcnc: 0.7 g/dL (ref 0.4–1.0)
B-Globulin SerPl Elph-Mcnc: 0.9 g/dL (ref 0.7–1.3)
Gamma Glob SerPl Elph-Mcnc: 2.6 g/dL — ABNORMAL HIGH (ref 0.4–1.8)
Globulin, Total: 4.4 g/dL — ABNORMAL HIGH (ref 2.2–3.9)
IgA, Qn, Serum: 16 mg/dL — ABNORMAL LOW (ref 64–422)
IgG, Qn, Serum: 3310 mg/dL — ABNORMAL HIGH (ref 700–1600)
IgM, Qn, Serum: <5 mg/dL — ABNORMAL LOW (ref 26–217)
M Protein SerPl Elph-Mcnc: 2.4 g/dL — ABNORMAL HIGH
Total Protein: 8.3 g/dL (ref 6.0–8.5)

## 2017-03-13 ENCOUNTER — Other Ambulatory Visit: Payer: Medicare Other

## 2017-03-20 ENCOUNTER — Ambulatory Visit: Payer: Medicare Other | Admitting: Hematology

## 2017-03-27 ENCOUNTER — Telehealth: Payer: Self-pay | Admitting: Hematology

## 2017-03-27 ENCOUNTER — Ambulatory Visit (HOSPITAL_BASED_OUTPATIENT_CLINIC_OR_DEPARTMENT_OTHER): Payer: Medicare Other | Admitting: Hematology

## 2017-03-27 ENCOUNTER — Encounter: Payer: Self-pay | Admitting: Hematology

## 2017-03-27 VITALS — BP 141/61 | HR 67 | Temp 97.8°F | Resp 18 | Ht 65.0 in | Wt 141.8 lb

## 2017-03-27 DIAGNOSIS — D892 Hypergammaglobulinemia, unspecified: Secondary | ICD-10-CM

## 2017-03-27 DIAGNOSIS — C9000 Multiple myeloma not having achieved remission: Secondary | ICD-10-CM | POA: Diagnosis not present

## 2017-03-27 DIAGNOSIS — D472 Monoclonal gammopathy: Secondary | ICD-10-CM

## 2017-03-27 NOTE — Progress Notes (Signed)
.    HEMATOLOGY/ONCOLOGY CONSULTATION NOTE  Date of Service: 03/27/2017    Patient Care Team: Lewis, [name], MD as PCP - General (Family Medicine)  CHIEF COMPLAINTS/PURPOSE OF CONSULTATION:   Follow up for smoldering multiple myeloma  Diagnosis: Smoldering multiple myeloma  Treatment: Close monitoring  HISTORY OF PRESENTING ILLNESS: please see my initial consultation for details of her initial presentation  INTERVAL HISTORY  Ms Stirn is here for her scheduled interval follow-up. She notes some chronic knee and ankle pain related to arthritis. She still continues to actively play pickle ball. Notes no acute new symptoms. No focal back pains or headaches.  Labs from 4/30 show no anemia, change in renal function or hypercalcemia. M protein levels are stable. No new fatigue. Feels well overall.  MEDICAL HISTORY:  Past Medical History:  Diagnosis Date  . Atrial fibrillation ([facility]) 12/2010   PMH of ; [place] , [place] [facility]  . Blood transfusion during current hospitalization 1980   complication after childbirth  . Cancer ([facility]) 2000   SKIN CANCER IN SCALP  . DJD (degenerative joint disease)   . Skin cancer of face 2015   Patient reports this was likely basal cell on the right side of her face needing 14 stitches.  . Smoldering multiple myeloma ([facility]) 2016   . Patient Active Problem List   Diagnosis Date Noted  . Vitamin D deficiency 12/27/2015  . 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 Hypergammaglobulinemia   . IgG monoclonal gammopathy of uncertain significance   . Abnormal gamma globulin level 04/14/2015  . Hyperlipidemia 04/03/2015  . Hyperglycemia 04/03/2015  . Diverticulosis of colon without hemorrhage 04/03/2015  . Arthralgia of multiple joints 04/03/2015  . DEGENERATIVE JOINT DISEASE 01/20/2010  . SKIN CANCER, HX OF 01/20/2010    SURGICAL HISTORY: Past Surgical History:  Procedure Laterality Date  . CHOLECYSTECTOMY  1990's  . COLONOSCOPY  02/2013   negative X 3; Dr Brodie  . G 3 P 1    . SHOULDER SURGERY Right 2012   R ; Dr Whitfield for rotator tear  . SHOULDER SURGERY Left 06/2014   Dr. Whitfied for rotator cuff  . T&A  child  . WISDOM TOOTH EXTRACTION  teenager    SOCIAL HISTORY: Social History   Social History  . Marital status: Married    Spouse name: N/A  . Number of children: N/A  . Years of education: N/A   Occupational History  . Not on file.   Social History Main Topics  . Smoking status: Former Smoker    Packs/day: 0.25    Years: 3.00    Quit date: 11/07/1970  . Smokeless tobacco: Never Used     Comment: 1/4 ppd 1963-1972  . Alcohol use Yes     Comment: Wine 14 glasses/week  . Drug use: No  . Sexual activity: No   Other Topics Concern  . Not on file   Social History Narrative  . No narrative on file    FAMILY HISTORY: Family History  Problem Relation Age of Onset  . Atrial fibrillation Father   . Asthma Brother   . Alzheimer's disease Mother   . Myasthenia gravis Mother        ocular  . Breast cancer Paternal Grandmother   . Depression Daughter   . Colon cancer Neg Hx   . Rectal cancer Neg Hx   . Stomach cancer Neg Hx   . Diabetes Neg Hx   . Stroke Neg Hx   . Heart attack   Neg Hx     ALLERGIES:  has No Known Allergies.  MEDICATIONS:  Current Outpatient Prescriptions  Medication Sig Dispense Refill  . diclofenac sodium (VOLTAREN) 1 % GEL Apply 2-4 g topically 4 (four) times daily. 5 Tube 2  . Naproxen Sodium (ALEVE) 220 MG CAPS Take 2 capsules by mouth as needed.      No current facility-administered medications for this visit.     REVIEW OF SYSTEMS:    10 Point review of Systems was done is negative except as noted above.  PHYSICAL EXAMINATION: ECOG PERFORMANCE STATUS: 0-1  . Vitals:   03/27/17 1409  Weight: 141 lb 12.8 oz (64.3 kg)  Height: 5' 5" (1.651 m)   Filed Weights   03/27/17 1409  Weight: 141 lb 12.8 oz (64.3 kg)   .Body mass index is 23.6  kg/m.  GENERAL:alert, in no acute distress and comfortable SKIN: skin color, texture, turgor are normal, no rashes or significant lesions EYES: normal, conjunctiva are pink and non-injected, sclera clear OROPHARYNX:no exudate, no erythema and lips, buccal mucosa, and tongue normal  NECK: supple, no JVD, thyroid normal size, non-tender, without nodularity LYMPH:  no palpable lymphadenopathy in the cervical, axillary or inguinal LUNGS: clear to auscultation with normal respiratory effort HEART: regular rate & rhythm,  no murmurs and no lower extremity edema ABDOMEN: abdomen soft, non-tender, normoactive bowel sounds  Musculoskeletal: no cyanosis of digits and no clubbing  PSYCH: alert & oriented x 3 with fluent speech NEURO: no focal motor/sensory deficits  LABORATORY DATA:  I have reviewed the data as listed  . CBC Latest Ref Rng & Units 03/06/2017 12/15/2016 09/08/2016  WBC 3.9 - 10.3 10e3/uL 4.9 6.7 6.2  Hemoglobin 11.6 - 15.9 g/dL 12.7 12.2 13.8  Hematocrit 34.8 - 46.6 % 37.0 36.2 39.5  Platelets 145 - 400 10e3/uL 261 242 274    . CMP Latest Ref Rng & Units 03/06/2017 03/06/2017 12/15/2016  Glucose 70 - 140 mg/dl 96 - 122  BUN 7.0 - 26.0 mg/dL 22.5 - 21.0  Creatinine 0.6 - 1.1 mg/dL 0.9 - 0.9  Sodium 136 - 145 mEq/L 139 - 138  Potassium 3.5 - 5.1 mEq/L 4.4 - 4.0  Chloride 96 - 112 mEq/L - - -  CO2 22 - 29 mEq/L 23 - 24  Calcium 8.4 - 10.4 mg/dL 9.3 - 9.4  Total Protein 6.0 - 8.5 g/dL 9.0(H) 8.3 8.9(H)  Total Bilirubin 0.20 - 1.20 mg/dL 0.67 - 0.53  Alkaline Phos 40 - 150 U/L 55 - 64  AST 5 - 34 U/L 20 - 17  ALT 0 - 55 U/L 19 - 20         RADIOGRAPHIC STUDIES: I have personally reviewed the radiological images as listed and agreed with the findings in the report. No results found. Normal DEXA scan in June 2015     Cytogenetic analysis: Revealed the presence of a normal female chromosomes with no observable clonal chromosomal abnormalities.   ASSESSMENT & PLAN:    71 year old Caucasian female in good overall health with  #1Smoldering Multiple Myeloma IgG lambda. Bone marrow plasma cells more than 10% ( noted to have 35% clonal plasma cells] Normal female karyotype cytogenetics with no other abnormalities SPEP shows M spike of 2.6 (no dramatic significant increase) with IFE showing IgG lambda paraprotein. Associated with some Immunoparesis characterized by associated decrease in IgA and IgM.  M protein 2.6--> 2.6---> 2.2--->2.4--->2.3--> 2.6-->2.4  SFLC K/L ratio low nl. Myeloma panel shows unchanged M spike Skeletal survey  shows no evidence of bone lesions.  Patient on follow-up today is feeling good and has no new focal symptoms or fatigue. No issues with infections.  Plan -no overt indication for treatment of Mrs Prochnow's SMM at this time. -No overt anemia, renal insufficiency, hypercalcemia or bone lesions on her body survey to suggest overt progression of disease. -will hold off on rpt BM Bx in the absence of significant new clinical concerns or significant increase in M protein levels. -Would recommend yearly flu shots  -Would recommend patient get her Prevnar and PCV vaccines if not done in the last 5 years .  -Return to care with Dr Irene Limbo in 3 months with repeat myeloma labs, cbc, cmp, K/L FLC   All off the patients questions were answered to her apparent satisfaction. The patient knows to call the clinic with any problems, questions or concerns.  I spent 20 minutes counseling the patient face to face. The total time spent in the appointment was 25 minutes and more than 50% was on counseling and direct patient cares.    Sullivan Lone MD [place] AAHIVMS [facility] Hematology/Oncology Physician [place]  (Office):       [phone] (Work cell):  [phone] (Fax):           [phone]

## 2017-03-27 NOTE — Telephone Encounter (Signed)
Gave patient avs report and appointments for August.  °

## 2017-04-07 ENCOUNTER — Ambulatory Visit: Payer: Medicare Other | Admitting: Nurse Practitioner

## 2017-04-18 ENCOUNTER — Encounter: Payer: Self-pay | Admitting: Nurse Practitioner

## 2017-04-18 ENCOUNTER — Ambulatory Visit (INDEPENDENT_AMBULATORY_CARE_PROVIDER_SITE_OTHER): Payer: Medicare Other | Admitting: Nurse Practitioner

## 2017-04-18 VITALS — BP 164/96 | HR 60 | Ht 65.25 in | Wt 142.0 lb

## 2017-04-18 DIAGNOSIS — Z0000 Encounter for general adult medical examination without abnormal findings: Secondary | ICD-10-CM | POA: Diagnosis not present

## 2017-04-18 DIAGNOSIS — Z01419 Encounter for gynecological examination (general) (routine) without abnormal findings: Secondary | ICD-10-CM | POA: Diagnosis not present

## 2017-04-18 DIAGNOSIS — Z124 Encounter for screening for malignant neoplasm of cervix: Secondary | ICD-10-CM

## 2017-04-18 NOTE — Progress Notes (Signed)
Patient ID: Jennifer [name], female   DOB: Sep 25, 1946, 71 y.o.   MRN: [id number]  70 y.o. G26P1021 Married Caucasian Fe here for annual exam.  Recent Oncology visit for the "smoldering" multiple myeloma was good - all labs are being monitored with them.  She feels well and is still doing competitions for pickle ball.   Patient's last menstrual period was 11/07/1996.          Sexually active: No.  The current method of family planning is post menopausal status.    Exercising: Yes.    Pickleball, golf Smoker:  no  Health Maintenance: Pap: 03/30/15, Negative  01/28/10, Negative History of Abnormal Pap: no MMG: 12/09/16, 3D, Density Category C, Bi-Rads 1:  Negative Self Breast exams: yes Colonoscopy: 03/08/13, small amount of diverticula, recheck in 10 years BMD: 03/28/14 T Score: 2.0 Spine / 0.0 Right Femur Neck / 0.0 Left Femur Neck / 1.4 Right 1/3 Radius TDaP: 03/15/11 Shingles: 09/18/08 Pneumonia: 10/12/12 Pneumovax Hep C and HIV: 07/06/15 Labs: PCP takes care of all labs   reports that she quit smoking about 46 years ago. She has a 0.75 pack-year smoking history. She has never used smokeless tobacco. She reports that she drinks alcohol. She reports that she does not use drugs.  Past Medical History:  Diagnosis Date  . Atrial fibrillation ([place]) 12/2010   PMH of ; [place] , [place] ER  . Blood transfusion during current hospitalization 2979   complication after childbirth  . Cancer ([place]) 2000   SKIN CANCER IN SCALP  . DJD (degenerative joint disease)   . Skin cancer of face 2015   Patient reports this was likely basal cell on the right side of her face needing 14 stitches.  . Smoldering multiple myeloma ([place]) 2016    Past Surgical History:  Procedure Laterality Date  . CHOLECYSTECTOMY  1990's  . COLONOSCOPY  02/2013   negative X 3; Dr Olevia Perches  . G 3 P 1    . SHOULDER SURGERY Right 2012   R ; Dr Durward Fortes for rotator tear  . SHOULDER SURGERY Left 06/2014   Dr. Sydnee Cabal for rotator cuff  .  T&A  child  . WISDOM TOOTH EXTRACTION  teenager    Current Outpatient Prescriptions  Medication Sig Dispense Refill  . diclofenac sodium (VOLTAREN) 1 % GEL Apply 2-4 g topically 4 (four) times daily. 5 Tube 2  . Naproxen Sodium (ALEVE) 220 MG CAPS Take 2 capsules by mouth as needed.      No current facility-administered medications for this visit.     Family History  Problem Relation Age of Onset  . Atrial fibrillation Father   . Asthma Brother   . Alzheimer's disease Mother   . Myasthenia gravis Mother        ocular  . Breast cancer Paternal Grandmother   . Depression Daughter   . Colon cancer Neg Hx   . Rectal cancer Neg Hx   . Stomach cancer Neg Hx   . Diabetes Neg Hx   . Stroke Neg Hx   . Heart attack Neg Hx     ROS:  Pertinent items are noted in HPI.  Otherwise, a comprehensive ROS was negative.  Exam:   LMP 11/07/1996    Ht Readings from Last 3 Encounters:  03/27/17 '5\' 5"'$  (1.651 m)  12/15/16 '5\' 5"'$  (1.651 m)  11/16/16 '5\' 5"'$  (1.651 m)    General appearance: alert, cooperative and appears stated age Head: Normocephalic, without obvious abnormality, atraumatic Neck:  no adenopathy, supple, symmetrical, trachea midline and thyroid normal to inspection and palpation Lungs: clear to auscultation bilaterally Breasts: normal appearance, no masses or tenderness Heart: regular rate and rhythm Abdomen: soft, non-tender; no masses,  no organomegaly Extremities: extremities normal, atraumatic, no cyanosis or edema Skin: Skin color, texture, turgor normal. No rashes or lesions Lymph nodes: Cervical, supraclavicular, and axillary nodes normal. No abnormal inguinal nodes palpated Neurologic: Grossly normal   Pelvic: External genitalia:  no lesions              Urethra:  normal appearing urethra with no masses, tenderness or lesions              Bartholin's and Skene's: normal                 Vagina: normal appearing vagina with normal color and discharge, no lesions               Cervix: anteverted              Pap taken: No. Bimanual Exam:  Uterus:  normal size, contour, position, consistency, mobility, non-tender              Adnexa: no mass, fullness, tenderness               Rectovaginal: Confirms               Anus:  normal sphincter tone, no lesions  Chaperone present: no  A:  Well Woman with normal exam  Postmenopausal off HRT 02/2015  "Smoldering" Multiple Myeloma - followed by Oncologist  Elevated BP  P:   Reviewed health and wellness pertinent to exam  Pap smear: no  Mammogram is due 12/2017  Pt will check on immunizations with PCP  Recorded the last 6 months of BP for her and she will discuss with PCP in [place] when she returns next week - she will also monitor BP at home.  Counseled on breast self exam, mammography screening, adequate intake of calcium and vitamin D, diet and exercise, Kegel's exercises return annually or prn  An After Visit Summary was printed and given to the patient.

## 2017-04-18 NOTE — Patient Instructions (Addendum)
EXERCISE AND DIET:  We recommended that you start or continue a regular exercise program for good health. Regular exercise means any activity that makes your heart beat faster and makes you sweat.  We recommend exercising at least 30 minutes per day at least 3 days a week, preferably 4 or 5.  We also recommend a diet low in fat and sugar.  Inactivity, poor dietary choices and obesity can cause diabetes, heart attack, stroke, and kidney damage, among others.    ALCOHOL AND SMOKING:  Women should limit their alcohol intake to no more than 7 drinks/beers/glasses of wine (combined, not each!) per week. Moderation of alcohol intake to this level decreases your risk of breast cancer and liver damage. And of course, no recreational drugs are part of a healthy lifestyle.  And absolutely no smoking or even second hand smoke. Most people know smoking can cause heart and lung diseases, but did you know it also contributes to weakening of your bones? Aging of your skin?  Yellowing of your teeth and nails?  CALCIUM AND VITAMIN D:  Adequate intake of calcium and Vitamin D are recommended.  The recommendations for exact amounts of these supplements seem to change often, but generally speaking 600 mg of calcium (either carbonate or citrate) and 800 units of Vitamin D per day seems prudent. Certain women may benefit from higher intake of Vitamin D.  If you are among these women, your doctor will have told you during your visit.    PAP SMEARS:  Pap smears, to check for cervical cancer or precancers,  have traditionally been done yearly, although recent scientific advances have shown that most women can have pap smears less often.  However, every woman still should have a physical exam from her gynecologist every year. It will include a breast check, inspection of the vulva and vagina to check for abnormal growths or skin changes, a visual exam of the cervix, and then an exam to evaluate the size and shape of the uterus and  ovaries.  And after 73 years of age, a rectal exam is indicated to check for rectal cancers. We will also provide age appropriate advice regarding health maintenance, like when you should have certain vaccines, screening for sexually transmitted diseases, bone density testing, colonoscopy, mammograms, etc.   MAMMOGRAMS:  All women over 40 years old should have a yearly mammogram. Many facilities now offer a "3D" mammogram, which may cost around $50 extra out of pocket. If possible,  we recommend you accept the option to have the 3D mammogram performed.  It both reduces the number of women who will be called back for extra views which then turn out to be normal, and it is better than the routine mammogram at detecting truly abnormal areas.    COLONOSCOPY:  Colonoscopy to screen for colon cancer is recommended for all women at age 50.  We know, you hate the idea of the prep.  We agree, BUT, having colon cancer and not knowing it is worse!!  Colon cancer so often starts as a polyp that can be seen and removed at colonscopy, which can quite literally save your life!  And if your first colonoscopy is normal and you have no family history of colon cancer, most women don't have to have it again for 10 years.  Once every ten years, you can do something that may end up saving your life, right?  We will be happy to help you get it scheduled when you are ready.    Be sure to check your insurance coverage so you understand how much it will cost.  It may be covered as a preventative service at no cost, but you should check your particular policy.    Prevnar 13 - new pnumonia shot  New shingles vaccine is a series 2 shots - make sure Oncologist is OK with you getting.  If Prevnar 13 and / or shingles is not covered at pharmacy.  Call PCP to see if they carry the vaccines.   TDaP was given 03/15/2011 Pneumonia 23 was given 12/14/2011 Hep C & HIV  07/06/15 - negative

## 2017-04-20 NOTE — Progress Notes (Signed)
Reviewed personally.  M. Suzanne Holman Bonsignore, MD.  

## 2017-05-24 ENCOUNTER — Encounter (INDEPENDENT_AMBULATORY_CARE_PROVIDER_SITE_OTHER): Payer: Self-pay | Admitting: Orthopaedic Surgery

## 2017-05-24 ENCOUNTER — Ambulatory Visit (INDEPENDENT_AMBULATORY_CARE_PROVIDER_SITE_OTHER): Payer: Medicare Other | Admitting: Orthopaedic Surgery

## 2017-05-24 VITALS — Ht 68.0 in | Wt 130.0 lb

## 2017-05-24 DIAGNOSIS — M1711 Unilateral primary osteoarthritis, right knee: Secondary | ICD-10-CM

## 2017-05-24 MED ORDER — METHYLPREDNISOLONE ACETATE 40 MG/ML IJ SUSP
80.0000 mg | INTRAMUSCULAR | Status: AC | PRN
  Administered 2017-05-24: 80 mg

## 2017-05-24 MED ORDER — BUPIVACAINE HCL 0.5 % IJ SOLN
3.0000 mL | INTRAMUSCULAR | Status: AC | PRN
  Administered 2017-05-24: 3 mL via INTRA_ARTICULAR

## 2017-05-24 MED ORDER — LIDOCAINE HCL 1 % IJ SOLN
5.0000 mL | INTRAMUSCULAR | Status: AC | PRN
  Administered 2017-05-24: 5 mL

## 2017-05-24 NOTE — Progress Notes (Signed)
Office Visit Note   Patient: Jennifer [name]           Date of Birth: [birth year]           MRN: [id number] Visit Date: 05/24/2017              Requested by: [name], MD No address on file PCP: [name], MD   Assessment & Plan: Visit Diagnoses:  1. Unilateral primary osteoarthritis, right knee     Plan:Cortisone injection right knee. Follow up as needed  Follow-Up Instructions: Return if symptoms worsen or fail to improve.   Orders:  No orders of the defined types were placed in this encounter.  No orders of the defined types were placed in this encounter.     Procedures: Large Joint Inj Date/Time: 05/24/2017 12:21 PM Performed by: Garald Balding Authorized by: Garald Balding   Consent Given by:  Patient Timeout: prior to procedure the correct patient, procedure, and site was verified   Indications:  Pain and joint swelling Location:  Knee Site:  R knee Prep: patient was prepped and draped in usual sterile fashion   Needle Size:  25 G Needle Length:  1.5 inches Approach:  Anteromedial Ultrasound Guidance: No   Fluoroscopic Guidance: No   Arthrogram: No   Medications:  5 mL lidocaine 1 %; 80 mg methylPREDNISolone acetate 40 MG/ML; 3 mL bupivacaine 0.5 % Aspiration Attempted: No   Patient tolerance:  Patient tolerated the procedure well with no immediate complications     Clinical Data: No additional findings.   Subjective: Chief Complaint  Patient presents with  . Right Knee - Pain  Established diagnosis of right knee osteoarthritis. Has had occasional cortisone injection and Visco supplementation with good relief. Recently has had an exacerbation of her knee pain to the point of compromise.  HPI  Review of Systems   Objective: Vital Signs: Ht '5\' 8"'$  (1.727 m)   Wt 130 lb (59 kg)   LMP 11/07/1996   BMI 19.77 kg/m   Physical Exam  Ortho Exam right knee without evidence of effusion. Predominantly medial joint pain that was  relatively mild. No lateral joint pain. Mild patellar crepitation. No instability. No popliteal mass. No swelling distally.  Specialty Comments:  No specialty comments available.  Imaging: No results found.   PMFS History: Patient Active Problem List   Diagnosis Date Noted  . Vitamin D deficiency 12/27/2015  . 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 Hypergammaglobulinemia   . IgG monoclonal gammopathy of uncertain significance   . Abnormal gamma globulin level 04/14/2015  . Hyperlipidemia 04/03/2015  . Hyperglycemia 04/03/2015  . Diverticulosis of colon without hemorrhage 04/03/2015  . Arthralgia of multiple joints 04/03/2015  . DEGENERATIVE JOINT DISEASE 01/20/2010  . SKIN CANCER, HX OF 01/20/2010   Past Medical History:  Diagnosis Date  . Atrial fibrillation ([place]) 12/2010   PMH of ; [place] , [place] [facility]  . Blood transfusion during current hospitalization 1572   complication after childbirth  . Cancer ([place]) 2000   SKIN CANCER IN [place]  . DJD (degenerative joint disease)   . Skin cancer of face 2015   Patient reports this was likely basal cell on the right side of her face needing 14 stitches.  . Smoldering multiple myeloma ([place]) 2016    Family History  Problem Relation Age of Onset  . Atrial fibrillation Father   . Asthma Brother   . Alzheimer's disease Mother   . Myasthenia gravis Mother  ocular  . Breast cancer Paternal Grandmother   . Depression Daughter   . Colon cancer Neg Hx   . Rectal cancer Neg Hx   . Stomach cancer Neg Hx   . Diabetes Neg Hx   . Stroke Neg Hx   . Heart attack Neg Hx     Past Surgical History:  Procedure Laterality Date  . CHOLECYSTECTOMY  1990's  . COLONOSCOPY  02/2013   negative X 3; Dr Olevia Perches  . G 3 P 1    . SHOULDER SURGERY Right 2012   R ; Dr Durward Fortes for rotator tear  . SHOULDER SURGERY Left 06/2014   Dr. Sydnee Cabal for rotator cuff  . T&A  child  . WISDOM TOOTH EXTRACTION  teenager   Social History    Occupational History  . Not on file.   Social History Main Topics  . Smoking status: Former Smoker    Packs/day: 0.25    Years: 3.00    Quit date: 11/07/1970  . Smokeless tobacco: Never Used     Comment: 1/4 ppd [id number]  . Alcohol use 6.0 oz/week    10 Glasses of wine per week  . Drug use: No  . Sexual activity: No     Garald Balding, MD   Note - This record has been created using [employer].  Chart creation errors have been sought, but may not always  have been located. Such creation errors do not reflect on  the standard of medical care.

## 2017-06-13 ENCOUNTER — Telehealth: Payer: Self-pay | Admitting: Obstetrics & Gynecology

## 2017-06-13 NOTE — Telephone Encounter (Signed)
Left message for patient to reschedule [address] appointment.

## 2017-06-26 ENCOUNTER — Telehealth: Payer: Self-pay | Admitting: Hematology

## 2017-06-26 ENCOUNTER — Ambulatory Visit (HOSPITAL_BASED_OUTPATIENT_CLINIC_OR_DEPARTMENT_OTHER): Payer: Medicare Other | Admitting: Hematology

## 2017-06-26 ENCOUNTER — Other Ambulatory Visit (HOSPITAL_BASED_OUTPATIENT_CLINIC_OR_DEPARTMENT_OTHER): Payer: Medicare Other

## 2017-06-26 VITALS — BP 140/79 | HR 56 | Temp 97.6°F | Resp 17 | Ht 68.0 in | Wt 141.5 lb

## 2017-06-26 DIAGNOSIS — D892 Hypergammaglobulinemia, unspecified: Secondary | ICD-10-CM | POA: Diagnosis not present

## 2017-06-26 DIAGNOSIS — C9000 Multiple myeloma not having achieved remission: Secondary | ICD-10-CM

## 2017-06-26 DIAGNOSIS — D472 Monoclonal gammopathy: Secondary | ICD-10-CM

## 2017-06-26 LAB — CBC & DIFF AND RETIC
BASO%: 0.1 % (ref 0.0–2.0)
Basophils Absolute: 0 10*3/uL (ref 0.0–0.1)
EOS%: 1.3 % (ref 0.0–7.0)
Eosinophils Absolute: 0.1 10*3/uL (ref 0.0–0.5)
HCT: 38.7 % (ref 34.8–46.6)
HGB: 13.1 g/dL (ref 11.6–15.9)
Immature Retic Fract: 2.8 % (ref 1.60–10.00)
LYMPH%: 33 % (ref 14.0–49.7)
MCH: 32.4 pg (ref 25.1–34.0)
MCHC: 33.9 g/dL (ref 31.5–36.0)
MCV: 95.8 fL (ref 79.5–101.0)
MONO#: 0.3 10*3/uL (ref 0.1–0.9)
MONO%: 3.8 % (ref 0.0–14.0)
NEUT#: 4.2 10*3/uL (ref 1.5–6.5)
NEUT%: 61.8 % (ref 38.4–76.8)
Platelets: 238 10*3/uL (ref 145–400)
RBC: 4.04 10*6/uL (ref 3.70–5.45)
RDW: 12.7 % (ref 11.2–14.5)
Retic %: 1.49 % (ref 0.70–2.10)
Retic Ct Abs: 60.2 10*3/uL (ref 33.70–90.70)
WBC: 6.9 10*3/uL (ref 3.9–10.3)
lymph#: 2.3 10*3/uL (ref 0.9–3.3)

## 2017-06-26 LAB — COMPREHENSIVE METABOLIC PANEL
ALT: 19 U/L (ref 0–55)
AST: 21 U/L (ref 5–34)
Albumin: 3.7 g/dL (ref 3.5–5.0)
Alkaline Phosphatase: 59 U/L (ref 40–150)
Anion Gap: 6 mEq/L (ref 3–11)
BUN: 18.1 mg/dL (ref 7.0–26.0)
CO2: 27 mEq/L (ref 22–29)
Calcium: 9.5 mg/dL (ref 8.4–10.4)
Chloride: 106 mEq/L (ref 98–109)
Creatinine: 0.9 mg/dL (ref 0.6–1.1)
EGFR: 62 mL/min/{1.73_m2} — ABNORMAL LOW (ref >90)
Glucose: 92 mg/dl (ref 70–140)
Potassium: 4.4 mEq/L (ref 3.5–5.1)
Sodium: 138 mEq/L (ref 136–145)
Total Bilirubin: 0.71 mg/dL (ref 0.20–1.20)
Total Protein: 9 g/dL — ABNORMAL HIGH (ref 6.4–8.3)

## 2017-06-26 NOTE — Patient Instructions (Signed)
Thank you for choosing [facility] to provide your oncology and hematology care.  To afford each patient quality time with our providers, please arrive 30 minutes before your scheduled appointment time.  If you arrive late for your appointment, you may be asked to reschedule.  We strive to give you quality time with our providers, and arriving late affects you and other patients whose appointments are after yours.   If you are a no show for multiple scheduled visits, you may be dismissed from the clinic at the providers discretion.    Again, thank you for choosing [facility], our hope is that these requests will decrease the amount of time that you wait before being seen by our physicians.  ______________________________________________________________________  Should you have questions after your visit to the [facility], please contact our office at [phone] between the hours of 8:30 and 4:30 p.m.    Voicemails left after 4:30p.m will not be returned until the following business day.    For prescription refill requests, please have your pharmacy contact us directly.  Please also try to allow 48 hours for prescription requests.    Please contact the scheduling department for questions regarding scheduling.  For scheduling of procedures such as PET scans, CT scans, MRI, Ultrasound, etc please contact central scheduling at [phone].    Resources For Cancer Patients and Caregivers:   Oncolink.org:  A wonderful resource for patients and healthcare providers for information regarding your disease, ways to tract your treatment, what to expect, etc.     American Cancer Society:  800-227-2345  Can help patients locate various types of support and financial assistance  Cancer Care: 1-800-813-HOPE (4673) Provides financial assistance, online support groups, medication/co-pay assistance.    [place] DSS:  336-641-3447 Where to apply for food  stamps, Medicaid, and utility assistance  Medicare Rights Center: 800-333-4114 Helps people with Medicare understand their rights and benefits, navigate the Medicare system, and secure the quality healthcare they deserve  SCAT: 336-333-6589 [place] [employer]'s shared-ride transportation service for eligible riders who have a disability that prevents them from riding the fixed route bus.    For additional information on assistance programs please contact our social worker:   Grier Hock/Abigail Elmore:  [phone]            

## 2017-06-26 NOTE — Telephone Encounter (Signed)
Scheduled appt per 8/20 los - Gave patient AVS and calender per los.  

## 2017-06-27 LAB — KAPPA/LAMBDA LIGHT CHAINS
Ig Kappa Free Light Chain: 5.5 mg/L (ref 3.3–19.4)
Ig Lambda Free Light Chain: 12.7 mg/L (ref 5.7–26.3)
Kappa/Lambda FluidC Ratio: 0.43 (ref 0.26–1.65)

## 2017-06-28 LAB — MULTIPLE MYELOMA PANEL, SERUM
Albumin SerPl Elph-Mcnc: 3.9 g/dL (ref 2.9–4.4)
Albumin/Glob SerPl: 0.9 (ref 0.7–1.7)
Alpha 1: 0.2 g/dL (ref 0.0–0.4)
Alpha2 Glob SerPl Elph-Mcnc: 0.7 g/dL (ref 0.4–1.0)
B-Globulin SerPl Elph-Mcnc: 0.9 g/dL (ref 0.7–1.3)
Gamma Glob SerPl Elph-Mcnc: 2.7 g/dL — ABNORMAL HIGH (ref 0.4–1.8)
Globulin, Total: 4.4 g/dL — ABNORMAL HIGH (ref 2.2–3.9)
IgA, Qn, Serum: 22 mg/dL — ABNORMAL LOW (ref 64–422)
IgG, Qn, Serum: 3516 mg/dL — ABNORMAL HIGH (ref 700–1600)
IgM, Qn, Serum: 5 mg/dL — ABNORMAL LOW (ref 26–217)
M Protein SerPl Elph-Mcnc: 2.5 g/dL — ABNORMAL HIGH
Total Protein: 8.3 g/dL (ref 6.0–8.5)

## 2017-07-02 NOTE — Progress Notes (Signed)
[facility]    HEMATOLOGY/ONCOLOGY CLINIC NOTE  Date of Service: 06/26/2017    Patient Care Team: [name], MD as PCP - General (Family Medicine)  CHIEF COMPLAINTS/PURPOSE OF CONSULTATION:   Follow up for smoldering multiple myeloma  Diagnosis: Smoldering multiple myeloma  Treatment: Close monitoring  HISTORY OF PRESENTING ILLNESS: please see my initial consultation for details of her initial presentation  INTERVAL HISTORY  Ms Mccarrell is here for her scheduled interval follow-up. She notes some chronic knee and ankle pain related to dengenerative arthritis and being active playing. She still continues to actively play pickle ball. Notes no acute new symptoms. No focal back pains or headaches.  No new fatigue. Feels well overall. No new focal bone pains.  MEDICAL HISTORY:  Past Medical History:  Diagnosis Date  . Atrial fibrillation ([place]) 12/2010   PMH of ; [place] , [place] [facility]  . Blood transfusion during current hospitalization 9242   complication after childbirth  . Cancer ([place]) 2000   SKIN CANCER IN [place]  . DJD (degenerative joint disease)   . Skin cancer of face 2015   Patient reports this was likely basal cell on the right side of her face needing 14 stitches.  . Smoldering multiple myeloma ([place]) 2016   . Patient Active Problem List   Diagnosis Date Noted  . Vitamin D deficiency 12/27/2015  . 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 Hypergammaglobulinemia   . IgG monoclonal gammopathy of uncertain significance   . Abnormal gamma globulin level 04/14/2015  . Hyperlipidemia 04/03/2015  . Hyperglycemia 04/03/2015  . Diverticulosis of colon without hemorrhage 04/03/2015  . Arthralgia of multiple joints 04/03/2015  . DEGENERATIVE JOINT DISEASE 01/20/2010  . SKIN CANCER, HX OF 01/20/2010    SURGICAL HISTORY: Past Surgical History:  Procedure Laterality Date  . CHOLECYSTECTOMY  1990's  . COLONOSCOPY  02/2013   negative X 3; Dr Olevia Perches  . G 3 P 1    . SHOULDER  SURGERY Right 2012   R ; Dr Durward Fortes for rotator tear  . SHOULDER SURGERY Left 06/2014   Dr. Sydnee Cabal for rotator cuff  . T&A  child  . WISDOM TOOTH EXTRACTION  teenager    SOCIAL HISTORY: Social History   Social History  . Marital status: Married    Spouse name: N/A  . Number of children: N/A  . Years of education: N/A   Occupational History  . Not on file.   Social History Main Topics  . Smoking status: Former Smoker    Packs/day: 0.25    Years: 3.00    Quit date: 11/07/1970  . Smokeless tobacco: Never Used     Comment: 1/4 ppd [id number]  . Alcohol use 6.0 oz/week    10 Glasses of wine per week  . Drug use: No  . Sexual activity: No   Other Topics Concern  . Not on file   Social History Narrative  . No narrative on file    FAMILY HISTORY: Family History  Problem Relation Age of Onset  . Atrial fibrillation Father   . Asthma Brother   . Alzheimer's disease Mother   . Myasthenia gravis Mother        ocular  . Breast cancer Paternal Grandmother   . Depression Daughter   . Colon cancer Neg Hx   . Rectal cancer Neg Hx   . Stomach cancer Neg Hx   . Diabetes Neg Hx   . Stroke Neg Hx   . Heart attack Neg Hx  ALLERGIES:  has No Known Allergies.  MEDICATIONS:  No current outpatient prescriptions on file.   No current facility-administered medications for this visit.     REVIEW OF SYSTEMS:    10 Point review of Systems was done is negative except as noted above.  PHYSICAL EXAMINATION: ECOG PERFORMANCE STATUS: 0-1  . Vitals:   06/26/17 1155  Weight: 141 lb 8 oz (64.2 kg)  Height: '5\' 8"'$  (1.727 m)   Filed Weights   06/26/17 1155  Weight: 141 lb 8 oz (64.2 kg)   .Body mass index is 21.52 kg/m.  GENERAL:alert, in no acute distress and comfortable SKIN: skin color, texture, turgor are normal, no rashes or significant lesions EYES: normal, conjunctiva are pink and non-injected, sclera clear OROPHARYNX:no exudate, no erythema and lips, buccal  mucosa, and tongue normal  NECK: supple, no JVD, thyroid normal size, non-tender, without nodularity LYMPH:  no palpable lymphadenopathy in the cervical, axillary or inguinal LUNGS: clear to auscultation with normal respiratory effort HEART: regular rate & rhythm,  no murmurs and no lower extremity edema ABDOMEN: abdomen soft, non-tender, normoactive bowel sounds  Musculoskeletal: no cyanosis of digits and no clubbing  PSYCH: alert & oriented x 3 with fluent speech NEURO: no focal motor/sensory deficits  LABORATORY DATA:  I have reviewed the data as listed  . CBC Latest Ref Rng & Units 06/26/2017 03/06/2017 12/15/2016  WBC 3.9 - 10.3 10e3/uL 6.9 4.9 6.7  Hemoglobin 11.6 - 15.9 g/dL 13.1 12.7 12.2  Hematocrit 34.8 - 46.6 % 38.7 37.0 36.2  Platelets 145 - 400 10e3/uL 238 261 242    . CMP Latest Ref Rng & Units 06/26/2017 06/26/2017 03/06/2017  Glucose 70 - 140 mg/dl 92 - 96  BUN 7.0 - 26.0 mg/dL 18.1 - 22.5  Creatinine 0.6 - 1.1 mg/dL 0.9 - 0.9  Sodium 136 - 145 mEq/L 138 - 139  Potassium 3.5 - 5.1 mEq/L 4.4 - 4.4  Chloride 96 - 112 mEq/L - - -  CO2 22 - 29 mEq/L 27 - 23  Calcium 8.4 - 10.4 mg/dL 9.5 - 9.3  Total Protein 6.0 - 8.5 g/dL 9.0(H) 8.3 9.0(H)  Total Bilirubin 0.20 - 1.20 mg/dL 0.71 - 0.67  Alkaline Phos 40 - 150 U/L 59 - 55  AST 5 - 34 U/L 21 - 20  ALT 0 - 55 U/L 19 - 19         RADIOGRAPHIC STUDIES: I have personally reviewed the radiological images as listed and agreed with the findings in the report. No results found. Normal DEXA scan in June 2015     Cytogenetic analysis: Revealed the presence of a normal female chromosomes with no observable clonal chromosomal abnormalities.   ASSESSMENT & PLAN:   71 year old Caucasian female in good overall health with  #1Smoldering Multiple Myeloma IgG lambda. Bone marrow plasma cells more than 10% ( noted to have 35% clonal plasma cells] Normal female karyotype cytogenetics with no other abnormalities SPEP shows M  spike of 2.5 (no dramatic significant increase) with IFE showing IgG lambda paraprotein. Associated with some Immunoparesis characterized by associated decrease in IgA and IgM.  M protein 2.6--> 2.6---> 2.2--->2.4--->2.3--> 2.6-->2.4->2.5  SFLC K/L ratio low nl. Myeloma panel shows unchanged M spike Skeletal survey shows no evidence of bone lesions.  Patient on follow-up today is feeling good and has no new focal symptoms or fatigue. No issues with infections.  Plan -no overt indication for treatment of Mrs Dambrosia's SMM at this time. -No overt anemia, renal insufficiency,  hypercalcemia or focal bone pains to suggest progression to overt myeloma at this time. -will hold off on rpt BM Bx in the absence of significant new clinical concerns or significant increase in M protein levels. -Would recommend yearly flu shots   -Return to care with Dr Irene Limbo in 4 months with repeat myeloma labs, cbc, cmp, K/L FLC   All off the patients questions were answered to her apparent satisfaction. The patient knows to call the clinic with any problems, questions or concerns.  I spent 20 minutes counseling the patient face to face. The total time spent in the appointment was 25 minutes and more than 50% was on counseling and direct patient cares.    Sullivan Lone MD [place] [facility] Hematology/Oncology Physician [place]  (Office):       [phone] (Work cell):  [phone] (Fax):           [phone]

## 2017-07-05 ENCOUNTER — Encounter: Payer: Self-pay | Admitting: Hematology

## 2017-07-05 ENCOUNTER — Telehealth: Payer: Self-pay

## 2017-07-05 NOTE — Telephone Encounter (Signed)
Called pt to notify her that Dr. Irene Limbo reviewed myeloma panel from last visit and numbers have not changed much since her previous labs from 03/06/17. He said there isn't anything to be concerned about at this time and we will f/u in December. Pt verbalized thanks for the call and in agreement to continue monitoring in December.

## 2017-08-02 ENCOUNTER — Encounter (INDEPENDENT_AMBULATORY_CARE_PROVIDER_SITE_OTHER): Payer: Self-pay | Admitting: Orthopaedic Surgery

## 2017-08-02 ENCOUNTER — Ambulatory Visit (INDEPENDENT_AMBULATORY_CARE_PROVIDER_SITE_OTHER): Payer: Medicare Other | Admitting: Orthopaedic Surgery

## 2017-08-02 VITALS — BP 145/82 | HR 61 | Resp 14 | Ht 68.0 in | Wt 130.0 lb

## 2017-08-02 DIAGNOSIS — G8929 Other chronic pain: Secondary | ICD-10-CM

## 2017-08-02 DIAGNOSIS — M25512 Pain in left shoulder: Secondary | ICD-10-CM | POA: Diagnosis not present

## 2017-08-02 DIAGNOSIS — M1711 Unilateral primary osteoarthritis, right knee: Secondary | ICD-10-CM

## 2017-08-02 MED ORDER — METHYLPREDNISOLONE ACETATE 40 MG/ML IJ SUSP
80.0000 mg | INTRAMUSCULAR | Status: AC | PRN
  Administered 2017-08-02: 80 mg

## 2017-08-02 MED ORDER — BUPIVACAINE HCL 0.5 % IJ SOLN
3.0000 mL | INTRAMUSCULAR | Status: AC | PRN
  Administered 2017-08-02: 3 mL via INTRA_ARTICULAR

## 2017-08-02 MED ORDER — LIDOCAINE HCL 1 % IJ SOLN
2.0000 mL | INTRAMUSCULAR | Status: AC | PRN
  Administered 2017-08-02: 2 mL

## 2017-08-02 MED ORDER — LIDOCAINE HCL 1 % IJ SOLN
5.0000 mL | INTRAMUSCULAR | Status: AC | PRN
  Administered 2017-08-02: 5 mL

## 2017-08-02 MED ORDER — BUPIVACAINE HCL 0.5 % IJ SOLN
2.0000 mL | INTRAMUSCULAR | Status: AC | PRN
  Administered 2017-08-02: 2 mL via INTRA_ARTICULAR

## 2017-08-02 NOTE — Progress Notes (Signed)
Office Visit Note   Patient: Jennifer [name]           Date of Birth: 20-Oct-1946           MRN: [id number] Visit Date: 08/02/2017              Requested by: [name], MD No address on file PCP: [name], MD   Assessment & Plan: Visit Diagnoses:  1. Unilateral primary osteoarthritis, right knee   2. Chronic left shoulder pain   impingement syndrome left shoulder with prior rotator cuff tear repair. Recent onset of impingement with repetitive overhead activity  Plan: cortisone injection right knee and subacromially left shoulder. Follow-up as needed  Follow-Up Instructions: Return if symptoms worsen or fail to improve.   Orders:  No orders of the defined types were placed in this encounter.  No orders of the defined types were placed in this encounter.     Procedures: Large Joint Inj Date/Time: 08/02/2017 9:58 AM Performed by: Garald Balding Authorized by: Garald Balding   Consent Given by:  Patient Timeout: prior to procedure the correct patient, procedure, and site was verified   Indications:  Pain and joint swelling Location:  Knee Site:  R knee Prep: patient was prepped and draped in usual sterile fashion   Needle Size:  25 G Needle Length:  1.5 inches Approach:  Anteromedial Ultrasound Guidance: No   Fluoroscopic Guidance: No   Arthrogram: No   Medications:  3 mL bupivacaine 0.5 %; 5 mL lidocaine 1 %; 80 mg methylPREDNISolone acetate 40 MG/ML Aspiration Attempted: No   Patient tolerance:  Patient tolerated the procedure well with no immediate complications  Large Joint Inj Date/Time: 08/02/2017 9:58 AM Performed by: Garald Balding Authorized by: Garald Balding   Consent Given by:  Patient Timeout: prior to procedure the correct patient, procedure, and site was verified   Indications:  Pain Location:  Shoulder Site:  L subacromial bursa Prep: patient was prepped and draped in usual sterile fashion   Needle Size:  25 G Needle  Length:  1.5 inches Approach:  Lateral Ultrasound Guidance: No   Fluoroscopic Guidance: No   Arthrogram: No   Medications:  80 mg methylPREDNISolone acetate 40 MG/ML; 2 mL lidocaine 1 %; 2 mL bupivacaine 0.5 % Aspiration Attempted: No   Patient tolerance:  Patient tolerated the procedure well with no immediate complications     Clinical Data: No additional findings.   Subjective: Chief Complaint  Patient presents with  . Right Knee - Pain, Edema  [name] has had an exacerbation of the osteoartis in her right kneeShe last had a cortisonenjection in July with good results. No recent injury or trauma but has been on her feet repetitively playing pickle ball. Also had recent exacerbation of left shoulder pain with repetitive overhead activity. Prior history of rotator cuff tear repair.  HPI  Review of Systems  Constitutional: Negative for chills, fatigue and fever.  Eyes: Negative for itching.  Respiratory: Negative for chest tightness and shortness of breath.   Cardiovascular: Negative for chest pain, palpitations and leg swelling.  Gastrointestinal: Negative for blood in stool, constipation and diarrhea.  Musculoskeletal: Positive for back pain. Negative for joint swelling, neck pain and neck stiffness.  Neurological: Negative for dizziness, weakness, numbness and headaches.  Hematological: Does not bruise/bleed easily.  Psychiatric/Behavioral: Negative for sleep disturbance. The patient is not nervous/anxious.      Objective: Vital Signs: BP (!) 145/82   Pulse 61  Resp 14   Ht '5\' 8"'$  (1.727 m)   Wt 130 lb (59 kg)   LMP 11/07/1996   BMI 19.77 kg/m   Physical Exam  Ortho Examright knee without effusion. Knee was not hot red warm or swollen. Mild medial joint pain. No crepitation. No instability. No distal edema. Skin intact. No calf pain.  Left shoulder with mild posterior subacromial pain. Some crepitation. Good strength. No loss of motion. Negative empty can testing.  No pain at the acromioclavicular joint  Specialty Comments:  No specialty comments available.  Imaging: No results found.   PMFS History: Patient Active Problem List   Diagnosis Date Noted  . Vitamin D deficiency 12/27/2015  . 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 Hypergammaglobulinemia   . IgG monoclonal gammopathy of uncertain significance   . Abnormal gamma globulin level 04/14/2015  . Hyperlipidemia 04/03/2015  . Hyperglycemia 04/03/2015  . Diverticulosis of colon without hemorrhage 04/03/2015  . Arthralgia of multiple joints 04/03/2015  . DEGENERATIVE JOINT DISEASE 01/20/2010  . SKIN CANCER, HX OF 01/20/2010   Past Medical History:  Diagnosis Date  . Atrial fibrillation ([place]) 12/2010   PMH of ; [place] , [place] [facility]  . Blood transfusion during current hospitalization 0102   complication after childbirth  . Cancer ([place]) 2000   SKIN CANCER IN [place]ALP  . DJD (degenerative joint disease)   . Skin cancer of face 2015   Patient reports this was likely basal cell on the right side of her face needing 14 stitches.  . Smoldering multiple myeloma ([place]) 2016    Family History  Problem Relation Age of Onset  . Atrial fibrillation Father   . Asthma Brother   . Alzheimer's disease Mother   . Myasthenia gravis Mother        ocular  . Breast cancer Paternal Grandmother   . Depression Daughter   . Colon cancer Neg Hx   . Rectal cancer Neg Hx   . Stomach cancer Neg Hx   . Diabetes Neg Hx   . Stroke Neg Hx   . Heart attack Neg Hx     Past Surgical History:  Procedure Laterality Date  . CHOLECYSTECTOMY  1990's  . COLONOSCOPY  02/2013   negative X 3; Dr Olevia Perches  . G 3 P 1    . SHOULDER SURGERY Right 2012   R ; Dr Durward Fortes for rotator tear  . SHOULDER SURGERY Left 06/2014   Dr. Sydnee Cabal for rotator cuff  . T&A  child  . WISDOM TOOTH EXTRACTION  teenager   Social History   Occupational History  . Not on file.   Social History Main Topics  . Smoking status:  Former Smoker    Packs/day: 0.25    Years: 3.00    Quit date: 11/07/1970  . Smokeless tobacco: Never Used     Comment: 1/4 ppd [id number]  . Alcohol use 6.0 oz/week    10 Glasses of wine per week  . Drug use: No  . Sexual activity: No

## 2017-08-23 ENCOUNTER — Encounter (INDEPENDENT_AMBULATORY_CARE_PROVIDER_SITE_OTHER): Payer: Self-pay | Admitting: Orthopaedic Surgery

## 2017-08-23 ENCOUNTER — Ambulatory Visit (INDEPENDENT_AMBULATORY_CARE_PROVIDER_SITE_OTHER): Payer: Medicare Other

## 2017-08-23 ENCOUNTER — Ambulatory Visit (INDEPENDENT_AMBULATORY_CARE_PROVIDER_SITE_OTHER): Payer: Medicare Other | Admitting: Orthopaedic Surgery

## 2017-08-23 VITALS — BP 137/79 | HR 54 | Resp 14 | Ht 67.0 in | Wt 135.0 lb

## 2017-08-23 DIAGNOSIS — M1711 Unilateral primary osteoarthritis, right knee: Secondary | ICD-10-CM | POA: Diagnosis not present

## 2017-08-23 NOTE — Progress Notes (Signed)
Office Visit Note   Patient: Jennifer [name]           Date of Birth: 02/28/1946           MRN: [id number] Visit Date: 08/23/2017              Requested by: Jennifer Gearing, MD No address on file PCP: Jennifer Gearing, MD   Assessment & Plan: Visit Diagnoses:  1. Unilateral primary osteoarthritis, right knee    Long discussion regarding and progressive osteoarthritis right knee her discussed treatment options. Cortisone injection a month ago really was not helpful. Films today reveal progressive osteoarthritis in all 3 compartments. We'll continue with NSAIDs in spider brace. Consider repeat Visco supplementation. Also discussed knee replacement as definitive procedure. Jennifer [name] is not ready for that yet and I would agree  Follow-Up Instructions: Return if symptoms worsen or fail to improve.   Orders:  Orders Placed This Encounter  Procedures  . XR KNEE 3 VIEW RIGHT   No orders of the defined types were placed in this encounter.     Procedures: No procedures performed   Clinical Data: No additional findings.   Subjective: Chief Complaint  Patient presents with  . Right Knee - Pain, Edema    Jennifer [name] is a 71 y o here wtih chronic Right knee pain. Pt had cortisone injection but did not last long. Today she wants a brace.  Cortisone injection right knee a month ago really was not very helpful. Rep.. Does have a knee support but she is not sure that it's a very effective.films today reveal progressive osteoarthritis in all 3 compartments. No fever or chills. No injury or trauma. Pain is localized to the right knee  HPI  Review of Systems  Constitutional: Negative for chills, fatigue and fever.  Eyes: Negative for itching.  Respiratory: Negative for chest tightness and shortness of breath.   Cardiovascular: Negative for chest pain, palpitations and leg swelling.  Gastrointestinal: Negative for blood in stool, constipation and diarrhea.  Endocrine: Negative for polyuria.    Genitourinary: Negative for dysuria.  Musculoskeletal: Positive for arthralgias. Negative for back pain, joint swelling, neck pain and neck stiffness.  Allergic/Immunologic: Negative for immunocompromised state.  Neurological: Negative for dizziness and numbness.  Hematological: Does not bruise/bleed easily.  Psychiatric/Behavioral: The patient is not nervous/anxious.      Objective: Vital Signs: BP 137/79   Pulse (!) 54   Resp 14   Ht 5' 7" (1.702 m)   Wt 135 lb (61.2 kg)   LMP 11/07/1996   BMI 21.14 kg/m   Physical Exam  Ortho Exam awake alert and oriented 3 comfortable sitting. Very small effusion right knee. Palpable osteophytes in the medial compartment. Lacks just a few degrees to full extension.  positive popliteal cyst without pain. No calf pain. No distal edema. Neurovascular exam intact. Flexed over 110. No instability. Predominantly medial joint pain.  Specialty Comments:  No specialty comments available.  Imaging: Xr Knee 3 View Right  Result Date: 08/23/2017 Films of the right knee were obtained in 3 projections standing. There are advanced tricompartmental degenerative changes. Progressive narrowing of the medial joint space compared to films that were performed in 2017. Subchondral sclerosis and peripheral osteophytes in all 3 compartments. No ectopic calcification.    PMFS History: Patient Active Problem List   Diagnosis Date Noted  . Unilateral primary osteoarthritis, right knee 08/23/2017  . Vitamin D deficiency 12/27/2015  . 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 Hypergammaglobulinemia   .  IgG monoclonal gammopathy of uncertain significance   . Abnormal gamma globulin level 04/14/2015  . Hyperlipidemia 04/03/2015  . Hyperglycemia 04/03/2015  . Diverticulosis of colon without hemorrhage 04/03/2015  . Arthralgia of multiple joints 04/03/2015  . DEGENERATIVE JOINT DISEASE 01/20/2010  . SKIN CANCER, HX OF 01/20/2010   Past Medical History:   Diagnosis Date  . Atrial fibrillation ([place]) 12/2010   PMH of ; [place] , [place] [facility]  . Blood transfusion during current hospitalization 3662   complication after childbirth  . Cancer ([place]) 2000   SKIN CANCER IN [place]ALP  . DJD (degenerative joint disease)   . Skin cancer of face 2015   Patient reports this was likely basal cell on the right side of her face needing 14 stitches.  . Smoldering multiple myeloma ([place]) 2016    Family History  Problem Relation Age of Onset  . Atrial fibrillation Father   . Asthma Brother   . Alzheimer's disease Mother   . Myasthenia gravis Mother        ocular  . Breast cancer Paternal Grandmother   . Depression Daughter   . Colon cancer Neg Hx   . Rectal cancer Neg Hx   . Stomach cancer Neg Hx   . Diabetes Neg Hx   . Stroke Neg Hx   . Heart attack Neg Hx     Past Surgical History:  Procedure Laterality Date  . CHOLECYSTECTOMY  1990's  . COLONOSCOPY  02/2013   negative X 3; Dr Olevia Perches  . G 3 P 1    . SHOULDER SURGERY Right 2012   R ; Dr Durward Fortes for rotator tear  . SHOULDER SURGERY Left 06/2014   Dr. Sydnee Cabal for rotator cuff  . T&A  child  . WISDOM TOOTH EXTRACTION  teenager   Social History   Occupational History  . Not on file.   Social History Main Topics  . Smoking status: Former Smoker    Packs/day: 0.25    Years: 3.00    Quit date: 11/07/1970  . Smokeless tobacco: Never Used     Comment: 1/4 ppd [id number]  . Alcohol use 6.0 oz/week    10 Glasses of wine per week  . Drug use: No  . Sexual activity: No

## 2017-09-18 DIAGNOSIS — H11003 Unspecified pterygium of eye, bilateral: Secondary | ICD-10-CM | POA: Diagnosis not present

## 2017-09-20 ENCOUNTER — Ambulatory Visit (INDEPENDENT_AMBULATORY_CARE_PROVIDER_SITE_OTHER): Payer: Medicare Other | Admitting: Orthopaedic Surgery

## 2017-09-20 ENCOUNTER — Encounter (INDEPENDENT_AMBULATORY_CARE_PROVIDER_SITE_OTHER): Payer: Self-pay | Admitting: Orthopaedic Surgery

## 2017-09-20 DIAGNOSIS — M1711 Unilateral primary osteoarthritis, right knee: Secondary | ICD-10-CM | POA: Diagnosis not present

## 2017-09-20 MED ORDER — METHYLPREDNISOLONE ACETATE 40 MG/ML IJ SUSP
80.0000 mg | INTRAMUSCULAR | Status: AC | PRN
  Administered 2017-09-20: 80 mg

## 2017-09-20 MED ORDER — LIDOCAINE HCL 1 % IJ SOLN
2.0000 mL | INTRAMUSCULAR | Status: AC | PRN
  Administered 2017-09-20: 2 mL

## 2017-09-20 MED ORDER — BUPIVACAINE HCL 0.5 % IJ SOLN
2.0000 mL | INTRAMUSCULAR | Status: AC | PRN
  Administered 2017-09-20: 2 mL via INTRA_ARTICULAR

## 2017-09-20 NOTE — Progress Notes (Signed)
Office Visit Note   Patient: Jennifer [name]           Date of Birth: 11-Feb-1946           MRN: [id number] Visit Date: 09/20/2017              Requested by: [name], MD No address on file PCP: [name], MD   Assessment & Plan: Visit Diagnoses:  1. Unilateral primary osteoarthritis, right knee     Plan: recurrent OA symptoms right. Will repeat cortisone injection Follow-Up Instructions: Return if symptoms worsen or fail to improve.   Orders:  No orders of the defined types were placed in this encounter.  No orders of the defined types were placed in this encounter.     Procedures: Large Joint Inj: R knee on 09/20/2017 11:39 AM Indications: pain and diagnostic evaluation Details: 25 G 1.5 in needle, anteromedial approach  Arthrogram: No  Medications: 2 mL lidocaine 1 %; 2 mL bupivacaine 0.5 %; 80 mg methylPREDNISolone acetate 40 MG/ML Procedure, treatment alternatives, risks and benefits explained, specific risks discussed. Consent was given by the patient. Immediately prior to procedure a time out was called to verify the correct patient, procedure, equipment, support staff and site/side marked as required. Patient was prepped and draped in the usual sterile fashion.       Clinical Data: No additional findings.   Subjective: Chief Complaint  Patient presents with  . Right Knee - Pain  Jennifer [name] has OA right knee with recurrent symptoms. Wants another cortisone inj  HPI  Review of Systems  Constitutional: Positive for fatigue. Negative for chills and fever.  Eyes: Negative for itching.  Respiratory: Negative for chest tightness and shortness of breath.   Cardiovascular: Negative for chest pain, palpitations and leg swelling.  Gastrointestinal: Negative for blood in stool, constipation and diarrhea.  Endocrine: Negative for polyuria.  Genitourinary: Negative for dysuria.  Musculoskeletal: Positive for neck stiffness. Negative for back pain, joint  swelling and neck pain.  Allergic/Immunologic: Negative for immunocompromised state.  Neurological: Negative for dizziness and numbness.  Hematological: Does not bruise/bleed easily.  Psychiatric/Behavioral: The patient is not nervous/anxious.      Objective: Vital Signs: LMP 11/07/1996   Physical Exam  Ortho Exam right knee without effusion. Mild-mod medial jt pain. No instability Specialty Comments:  No specialty comments available.  Imaging: No results found.   PMFS History: Patient Active Problem List   Diagnosis Date Noted  . Unilateral primary osteoarthritis, right knee 08/23/2017  . Vitamin D deficiency 12/27/2015  . 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 Hypergammaglobulinemia   . IgG monoclonal gammopathy of uncertain significance   . Abnormal gamma globulin level 04/14/2015  . Hyperlipidemia 04/03/2015  . Hyperglycemia 04/03/2015  . Diverticulosis of colon without hemorrhage 04/03/2015  . Arthralgia of multiple joints 04/03/2015  . DEGENERATIVE JOINT DISEASE 01/20/2010  . SKIN CANCER, HX OF 01/20/2010   Past Medical History:  Diagnosis Date  . Atrial fibrillation ([place]) 12/2010   PMH of ; [place] , [place] [facility]  . Blood transfusion during current hospitalization 1448   complication after childbirth  . Cancer ([place]) 2000   SKIN CANCER IN SCALP  . DJD (degenerative joint disease)   . Skin cancer of face 2015   Patient reports this was likely basal cell on the right side of her face needing 14 stitches.  . Smoldering multiple myeloma ([facility] [place]) 2016    Family History  Problem Relation Age of Onset  . Atrial fibrillation  Father   . Asthma Brother   . Alzheimer's disease Mother   . Myasthenia gravis Mother        ocular  . Breast cancer Paternal Grandmother   . Depression Daughter   . Colon cancer Neg Hx   . Rectal cancer Neg Hx   . Stomach cancer Neg Hx   . Diabetes Neg Hx   . Stroke Neg Hx   . Heart attack Neg Hx     Past Surgical History:    Procedure Laterality Date  . CHOLECYSTECTOMY  1990's  . COLONOSCOPY  02/2013   negative X 3; Dr Olevia Perches  . G 3 P 1    . SHOULDER SURGERY Right 2012   R ; Dr Durward Fortes for rotator tear  . SHOULDER SURGERY Left 06/2014   Dr. Sydnee Cabal for rotator cuff  . T&A  child  . WISDOM TOOTH EXTRACTION  teenager   Social History   Occupational History  . Not on file  Tobacco Use  . Smoking status: Former Smoker    Packs/day: 0.25    Years: 3.00    Pack years: 0.75    Last attempt to quit: 11/07/1970    Years since quitting: 46.9  . Smokeless tobacco: Never Used  . Tobacco comment: 1/4 ppd [id number]  Substance and Sexual Activity  . Alcohol use: Yes    Alcohol/week: 6.0 oz    Types: 10 Glasses of wine per week  . Drug use: No  . Sexual activity: No    Partners: Male    Birth control/protection: Abstinence

## 2017-10-04 ENCOUNTER — Ambulatory Visit (INDEPENDENT_AMBULATORY_CARE_PROVIDER_SITE_OTHER): Payer: Medicare Other | Admitting: Orthopaedic Surgery

## 2017-10-04 ENCOUNTER — Encounter (INDEPENDENT_AMBULATORY_CARE_PROVIDER_SITE_OTHER): Payer: Self-pay | Admitting: Orthopaedic Surgery

## 2017-10-04 DIAGNOSIS — M1711 Unilateral primary osteoarthritis, right knee: Secondary | ICD-10-CM | POA: Diagnosis not present

## 2017-10-04 MED ORDER — SODIUM HYALURONATE (VISCOSUP) 20 MG/2ML IX SOSY
20.0000 mg | PREFILLED_SYRINGE | INTRA_ARTICULAR | Status: AC | PRN
  Administered 2017-10-04: 20 mg via INTRA_ARTICULAR

## 2017-10-04 NOTE — Progress Notes (Signed)
Office Visit Note   Patient: Jennifer [name]           Date of Birth: January 08, 1946           MRN: [id number] Visit Date: 10/04/2017              Requested by: [name], MD No address on file PCP: [name], MD   Assessment & Plan: Visit Diagnoses:  1. Unilateral primary osteoarthritis, right knee     Plan: 2 weeks status post cortisone injection right knee. We will initiate first Visco supplementation injection today and return weekly for the next 2 weeks to complete the series Follow-Up Instructions: Return in about 1 week (around 10/11/2017).   Orders:  No orders of the defined types were placed in this encounter.  No orders of the defined types were placed in this encounter.     Procedures: Large Joint Inj: R knee on 10/04/2017 11:00 AM Indications: pain and diagnostic evaluation Details: 25 G 1.5 in needle, anteromedial approach  Arthrogram: No  Medications: 20 mg Sodium Hyaluronate 20 MG/2ML Procedure, treatment alternatives, risks and benefits explained, specific risks discussed. Consent was given by the patient. Immediately prior to procedure a time out was called to verify the correct patient, procedure, equipment, support staff and site/side marked as required. Patient was prepped and draped in the usual sterile fashion.       Clinical Data: No additional findings.   Subjective: No chief complaint on file. Feeling somewhat better with a cortisone injection 2 weeks ago. We'll start Euflexxa today  HPI  Review of Systems   Objective: Vital Signs: LMP 11/07/1996   Physical Exam  Ortho Exam right knee not hot warm red or swollen. Minimal medial joint pain. Walks without a limp  Specialty Comments:  No specialty comments available.  Imaging: No results found.   PMFS History: Patient Active Problem List   Diagnosis Date Noted  . Unilateral primary osteoarthritis, right knee 08/23/2017  . Vitamin D deficiency 12/27/2015  . Smoldering  multiple myeloma ([place]) 12/22/2015  . Hypergammaglobulinemia   . IgG monoclonal gammopathy of uncertain significance   . Abnormal gamma globulin level 04/14/2015  . Hyperlipidemia 04/03/2015  . Hyperglycemia 04/03/2015  . Diverticulosis of colon without hemorrhage 04/03/2015  . Arthralgia of multiple joints 04/03/2015  . DEGENERATIVE JOINT DISEASE 01/20/2010  . SKIN CANCER, HX OF 01/20/2010   Past Medical History:  Diagnosis Date  . Atrial fibrillation ([place]) 12/2010   PMH of ; [place] , [place] [facility]  . Blood transfusion during current hospitalization 2585   complication after childbirth  . Cancer ([place]) 2000   SKIN CANCER IN [place]ALP  . DJD (degenerative joint disease)   . Skin cancer of face 2015   Patient reports this was likely basal cell on the right side of her face needing 14 stitches.  . Smoldering multiple myeloma ([place]) 2016    Family History  Problem Relation Age of Onset  . Atrial fibrillation Father   . Asthma Brother   . Alzheimer's disease Mother   . Myasthenia gravis Mother        ocular  . Breast cancer Paternal Grandmother   . Depression Daughter   . Colon cancer Neg Hx   . Rectal cancer Neg Hx   . Stomach cancer Neg Hx   . Diabetes Neg Hx   . Stroke Neg Hx   . Heart attack Neg Hx     Past Surgical History:  Procedure Laterality Date  .  CHOLECYSTECTOMY  1990's  . COLONOSCOPY  02/2013   negative X 3; Dr Olevia Perches  . G 3 P 1    . SHOULDER SURGERY Right 2012   R ; Dr Durward Fortes for rotator tear  . SHOULDER SURGERY Left 06/2014   Dr. Sydnee Cabal for rotator cuff  . T&A  child  . WISDOM TOOTH EXTRACTION  teenager   Social History   Occupational History  . Not on file  Tobacco Use  . Smoking status: Former Smoker    Packs/day: 0.25    Years: 3.00    Pack years: 0.75    Last attempt to quit: 11/07/1970    Years since quitting: 46.9  . Smokeless tobacco: Never Used  . Tobacco comment: 1/4 ppd [id number]  Substance and Sexual Activity  . Alcohol use: Yes     Alcohol/week: 6.0 oz    Types: 10 Glasses of wine per week  . Drug use: No  . Sexual activity: No    Partners: Male    Birth control/protection: Abstinence     Garald Balding, MD   Note - This record has been created using Editor, commissioning.  Chart creation errors have been sought, but may not always  have been located. Such creation errors do not reflect on  the standard of medical care.

## 2017-10-11 ENCOUNTER — Encounter (INDEPENDENT_AMBULATORY_CARE_PROVIDER_SITE_OTHER): Payer: Self-pay | Admitting: Orthopaedic Surgery

## 2017-10-11 ENCOUNTER — Encounter (INDEPENDENT_AMBULATORY_CARE_PROVIDER_SITE_OTHER): Payer: Self-pay

## 2017-10-11 ENCOUNTER — Ambulatory Visit (INDEPENDENT_AMBULATORY_CARE_PROVIDER_SITE_OTHER): Payer: Self-pay

## 2017-10-11 ENCOUNTER — Ambulatory Visit (INDEPENDENT_AMBULATORY_CARE_PROVIDER_SITE_OTHER): Payer: Medicare Other | Admitting: Orthopaedic Surgery

## 2017-10-11 DIAGNOSIS — M1711 Unilateral primary osteoarthritis, right knee: Secondary | ICD-10-CM

## 2017-10-11 DIAGNOSIS — M159 Polyosteoarthritis, unspecified: Secondary | ICD-10-CM

## 2017-10-11 DIAGNOSIS — M25512 Pain in left shoulder: Secondary | ICD-10-CM | POA: Diagnosis not present

## 2017-10-11 DIAGNOSIS — M25561 Pain in right knee: Secondary | ICD-10-CM

## 2017-10-11 DIAGNOSIS — G8929 Other chronic pain: Secondary | ICD-10-CM

## 2017-10-11 MED ORDER — SODIUM HYALURONATE (VISCOSUP) 20 MG/2ML IX SOSY
20.0000 mg | PREFILLED_SYRINGE | INTRA_ARTICULAR | Status: AC | PRN
  Administered 2017-10-11: 20 mg via INTRA_ARTICULAR

## 2017-10-11 MED ORDER — LIDOCAINE HCL 1 % IJ SOLN
2.0000 mL | INTRAMUSCULAR | Status: AC | PRN
  Administered 2017-10-11: 2 mL

## 2017-10-11 NOTE — Progress Notes (Unsigned)
   Procedure Note  Patient: Jennifer [name]             Date of Birth: 1945-12-28           MRN: [id number]             Visit Date: 10/11/2017  Procedures: Visit Diagnoses: Unilateral primary osteoarthritis, right knee - Plan: Large Joint Inj: R knee  Chronic left shoulder pain  Large Joint Inj: R knee on 10/11/2017 12:16 PM Indications: pain and diagnostic evaluation Details: 25 G 1.5 in needle, anteromedial approach  Arthrogram: No  Medications: 20 mg Sodium Hyaluronate 20 MG/2ML Procedure, treatment alternatives, risks and benefits explained, specific risks discussed. Consent was given by the patient. Immediately prior to procedure a time out was called to verify the correct patient, procedure, equipment, support staff and site/side marked as required. Patient was prepped and draped in the usual sterile fashion.

## 2017-10-11 NOTE — Progress Notes (Signed)
Office Visit Note   Patient: Jennifer [name]           Date of Birth: May 18, 1946           MRN: [id number] Visit Date: 10/11/2017              Requested by: [name], MD No address on file PCP: [name], MD   Assessment & Plan: Visit Diagnoses:  1. Chronic left shoulder pain   2. Osteoarthritis of multiple joints, unspecified osteoarthritis type     Plan: Second U flexor injection right knee. Also experiencing recurrent pain left shoulder. Films reveal minimal arthritic changes. Always a possibility of recurrent rotator cuff tear discussed at length. We'll consider MRI scan at some point. Continue with exercises. Office 1 week to complete U flexor  Follow-Up Instructions: Return in about 1 week (around 10/18/2017).   Orders:  Orders Placed This Encounter  Procedures  . Large Joint Inj: R knee  . XR Shoulder Left  . Ambulatory referral to Rheumatology   No orders of the defined types were placed in this encounter.     Procedures: Large Joint Inj: R knee on 10/11/2017 12:33 PM Indications: pain and diagnostic evaluation Details: 25 G 1.5 in needle, anteromedial approach  Arthrogram: No  Medications: 2 mL lidocaine 1 %; 20 mg Sodium Hyaluronate 20 MG/2ML Procedure, treatment alternatives, risks and benefits explained, specific risks discussed. Consent was given by the patient. Immediately prior to procedure a time out was called to verify the correct patient, procedure, equipment, support staff and site/side marked as required. Patient was prepped and draped in the usual sterile fashion.       Clinical Data: No additional findings.   Subjective: No chief complaint on file. Second U flexor injection today. Diania also having some problems with her left shoulder with pain related to her playing pickle ball. Films were negative except for some minimal arthritis of the humeral head. She'll he could have a recurrent rotator cuff tear. Have discussed and we'll  continue with exercises. Minimally positive impingement some crepitation but minimal pain. Good grip and good release. Biceps appears to be intact  HPI  Review of Systems  Constitutional: Negative for chills, fatigue and fever.  Eyes: Negative for itching.  Respiratory: Negative for chest tightness and shortness of breath.   Cardiovascular: Negative for chest pain, palpitations and leg swelling.  Gastrointestinal: Negative for blood in stool, constipation and diarrhea.  Endocrine: Negative for polyuria.  Genitourinary: Negative for dysuria.  Musculoskeletal: Negative for back pain, joint swelling, neck pain and neck stiffness.  Allergic/Immunologic: Negative for immunocompromised state.  Neurological: Negative for dizziness and numbness.  Hematological: Does not bruise/bleed easily.  Psychiatric/Behavioral: The patient is not nervous/anxious.      Objective: Vital Signs: LMP 11/07/1996   Physical Exam  Ortho Exam right knee not hot red or swollen. We'll proceed with the second U flexor injection.  Left shoulder with minimally positive impingement some crepitation. No particular pain. Skin intact. Full overhead range of motion. No instability. Good grip and release  Specialty Comments:  No specialty comments available.  Imaging: Xr Shoulder Left  Result Date: 10/11/2017 Films of the left shoulder obtained in the AP and lateral projections. Minimal osteophyte formation of the inferior aspect of the humeral head. Otherwise humeral head centered about the glenoid. No ectopic calcification. Normal space between the humeral head and the acromion.    PMFS History: Patient Active Problem List   Diagnosis Date Noted  .  Unilateral primary osteoarthritis, right knee 08/23/2017  . Vitamin D deficiency 12/27/2015  . Smoldering multiple myeloma ([place]) 12/22/2015  . Hypergammaglobulinemia   . IgG monoclonal gammopathy of uncertain significance   . Abnormal gamma globulin level  04/14/2015  . Hyperlipidemia 04/03/2015  . Hyperglycemia 04/03/2015  . Diverticulosis of colon without hemorrhage 04/03/2015  . Arthralgia of multiple joints 04/03/2015  . DEGENERATIVE JOINT DISEASE 01/20/2010  . SKIN CANCER, HX OF 01/20/2010   Past Medical History:  Diagnosis Date  . Atrial fibrillation ([place]) 12/2010   PMH of ; [place] , [place] [facility]  . Blood transfusion during current hospitalization 2992   complication after childbirth  . Cancer ([place]) 2000   SKIN CANCER IN [place]  . DJD (degenerative joint disease)   . Skin cancer of face 2015   Patient reports this was likely basal cell on the right side of her face needing 14 stitches.  . Smoldering multiple myeloma ([place]) 2016    Family History  Problem Relation Age of Onset  . Atrial fibrillation Father   . Asthma Brother   . Alzheimer's disease Mother   . Myasthenia gravis Mother        ocular  . Breast cancer Paternal Grandmother   . Depression Daughter   . Colon cancer Neg Hx   . Rectal cancer Neg Hx   . Stomach cancer Neg Hx   . Diabetes Neg Hx   . Stroke Neg Hx   . Heart attack Neg Hx     Past Surgical History:  Procedure Laterality Date  . CHOLECYSTECTOMY  1990's  . COLONOSCOPY  02/2013   negative X 3; Dr Olevia Perches  . G 3 P 1    . SHOULDER SURGERY Right 2012   R ; Dr Durward Fortes for rotator tear  . SHOULDER SURGERY Left 06/2014   Dr. Sydnee Cabal for rotator cuff  . T&A  child  . WISDOM TOOTH EXTRACTION  teenager   Social History   Occupational History  . Not on file  Tobacco Use  . Smoking status: Former Smoker    Packs/day: 0.25    Years: 3.00    Pack years: 0.75    Last attempt to quit: 11/07/1970    Years since quitting: 46.9  . Smokeless tobacco: Never Used  . Tobacco comment: 1/4 ppd [id number]  Substance and Sexual Activity  . Alcohol use: Yes    Alcohol/week: 6.0 oz    Types: 10 Glasses of wine per week  . Drug use: No  . Sexual activity: No    Partners: Male    Birth control/protection: Abstinence

## 2017-10-18 ENCOUNTER — Ambulatory Visit (INDEPENDENT_AMBULATORY_CARE_PROVIDER_SITE_OTHER): Payer: Medicare Other | Admitting: Orthopaedic Surgery

## 2017-10-18 ENCOUNTER — Encounter (INDEPENDENT_AMBULATORY_CARE_PROVIDER_SITE_OTHER): Payer: Self-pay | Admitting: Orthopaedic Surgery

## 2017-10-18 ENCOUNTER — Other Ambulatory Visit (INDEPENDENT_AMBULATORY_CARE_PROVIDER_SITE_OTHER): Payer: Self-pay

## 2017-10-18 DIAGNOSIS — M1711 Unilateral primary osteoarthritis, right knee: Secondary | ICD-10-CM | POA: Diagnosis not present

## 2017-10-18 DIAGNOSIS — G8929 Other chronic pain: Secondary | ICD-10-CM | POA: Diagnosis not present

## 2017-10-18 DIAGNOSIS — M25512 Pain in left shoulder: Secondary | ICD-10-CM | POA: Diagnosis not present

## 2017-10-18 MED ORDER — SODIUM HYALURONATE (VISCOSUP) 20 MG/2ML IX SOSY
20.0000 mg | PREFILLED_SYRINGE | INTRA_ARTICULAR | Status: AC | PRN
  Administered 2017-10-18: 20 mg via INTRA_ARTICULAR

## 2017-10-18 NOTE — Progress Notes (Signed)
Office Visit Note  Patient: Jennifer [name]             Date of Birth: 01/11/1946           MRN: [id number]             PCP: [name], MD Referring: [name], MD Visit Date: 10/19/2017 Occupation: '@GUAROCC'$ @    Subjective:  Other (BIL hand, right knee pain, BIL ankle/ foot pain )   History of Present Illness: Jennifer [name] is a 71 y.o. female with history of osteoarthritis of right knee.  Patient has been followed by Dr. Durward Fortes for her right knee pain and swelling.  She received her 3rd Euflexxa injection 2 days ago by Dr. Durward Fortes.  Patient states she competes in [employer] on a regular basis.  She states her hand, wrists, feet, and ankles began to become painful about 5 years ago and have worsened over the past 1 year. She denies any joint swelling except in her right knee joint.  She states her left 2nd toe was injured in a water skiing accident a few years ago and has been misaligned since.  She continues to have pain with activity.  She states she has had bilateral rotator cuff repaired in the past by Dr. Durward Fortes.  She also has noticed some "popping" in her C-spine.    Activities of Daily Living:  Patient reports morning stiffness for 2 minutes.   Patient Denies nocturnal pain.  Difficulty dressing/grooming: Denies Difficulty climbing stairs: Reports Difficulty getting out of chair: Denies Difficulty using hands for taps, buttons, cutlery, and/or writing: Denies   Review of Systems  Constitutional: Negative for fatigue, night sweats, weight gain, weight loss and weakness.  HENT: Negative for mouth sores, trouble swallowing, trouble swallowing, mouth dryness and nose dryness.   Eyes: Negative for pain, redness, visual disturbance and dryness.  Respiratory: Negative for cough, shortness of breath and difficulty breathing.   Cardiovascular: Negative for chest pain, palpitations, hypertension, irregular heartbeat and swelling in legs/feet.    Gastrointestinal: Negative for blood in stool, constipation and diarrhea.  Endocrine: Negative for increased urination.  Genitourinary: Negative for vaginal dryness.  Musculoskeletal: Positive for arthralgias, joint pain and morning stiffness. Negative for joint swelling, myalgias, muscle weakness, muscle tenderness and myalgias.  Skin: Negative for color change, rash, hair loss, skin tightness, ulcers and sensitivity to sunlight.  Allergic/Immunologic: Negative for susceptible to infections.  Neurological: Negative for dizziness, memory loss and night sweats.  Hematological: Negative for swollen glands.  Psychiatric/Behavioral: Negative for depressed mood and sleep disturbance. The patient is not nervous/anxious.     PMFS History:  Patient Active Problem List   Diagnosis Date Noted  . Unilateral primary osteoarthritis, right knee 08/23/2017  . Vitamin D deficiency 12/27/2015  . Smoldering multiple myeloma ([place]) 12/22/2015  . Hypergammaglobulinemia   . IgG monoclonal gammopathy of uncertain significance   . Abnormal gamma globulin level 04/14/2015  . Hyperlipidemia 04/03/2015  . Hyperglycemia 04/03/2015  . Diverticulosis of colon without hemorrhage 04/03/2015  . Arthralgia of multiple joints 04/03/2015  . Osteoarthritis 01/20/2010  . SKIN CANCER, HX OF 01/20/2010    Past Medical History:  Diagnosis Date  . Atrial fibrillation ([place]) 12/2010   PMH of ; [place] , [place] [facility]  . Blood transfusion during current hospitalization 7412   complication after childbirth  . Cancer ([place]) 2000   SKIN CANCER IN SCALP  . DJD (degenerative joint disease)   . Skin cancer of face 2015  Patient reports this was likely basal cell on the right side of her face needing 14 stitches.  . Smoldering multiple myeloma ([place]) 2016    Family History  Problem Relation Age of Onset  . Atrial fibrillation Father   . Asthma Brother   . Alzheimer's disease Mother   . Myasthenia gravis Mother        ocular  .  Breast cancer Paternal Grandmother   . Depression Daughter   . Colon cancer Neg Hx   . Rectal cancer Neg Hx   . Stomach cancer Neg Hx   . Diabetes Neg Hx   . Stroke Neg Hx   . Heart attack Neg Hx    Past Surgical History:  Procedure Laterality Date  . CHOLECYSTECTOMY  1990's  . COLONOSCOPY  02/2013   negative X 3; Dr Olevia Perches  . G 3 P 1    . SHOULDER SURGERY Right 2012   R ; Dr Durward Fortes for rotator tear  . SHOULDER SURGERY Left 06/2014   Dr. Sydnee Cabal for rotator cuff  . T&A  child  . WISDOM TOOTH EXTRACTION  teenager   Social History   Social History Narrative  . Not on file     Objective: Vital Signs: BP (!) 145/76 (BP Location: Right Arm, Patient Position: Sitting, Cuff Size: Normal)   Pulse 61   Resp 15   Ht 5' 6.5" (1.689 m)   Wt 145 lb (65.8 kg)   LMP 11/07/1996   BMI 23.05 kg/m    Physical Exam  Constitutional: She is oriented to person, place, and time. She appears well-developed and well-nourished.  HENT:  Head: Normocephalic and atraumatic.  Pterygium in bilateral eyes   Eyes: Conjunctivae and EOM are normal.  Neck: Normal range of motion.  Cardiovascular: Normal rate, regular rhythm, normal heart sounds and intact distal pulses.  Pulmonary/Chest: Effort normal and breath sounds normal.  Abdominal: Soft. Bowel sounds are normal.  Lymphadenopathy:    She has no cervical adenopathy.  Neurological: She is alert and oriented to person, place, and time.  Skin: Skin is warm and dry. Capillary refill takes less than 2 seconds.  Psychiatric: She has a normal mood and affect. Her behavior is normal.  Nursing note and vitals reviewed.    Musculoskeletal Exam: C-spine, thoracic, lumbar spine good range of motion. Shoulder joints good range of motion with some discomfort. Elbow joints and wrist joints are good range of motion. She is some tenderness on palpation of bilateral wrist joints. She has bilateral CMC PIP/DIP thickening without any synovitis. Hip joints are  good range of motion. She is some tenderness over left trochanteric bursa. Right knee joint was warm and swollen with effusion. Left knee joint was good range of motion without any warmth swelling or effusion. Ankle joints are good range of motion area and she has bilateral first and fifth MTP thickening. She's also has hammertoes. She has subluxation of her left second MTP due to prior injury. No synovitis was noted. Dorsal spurring was noted.  CDAI Exam: No CDAI exam completed.    Investigation: No additional findings. CBC Latest Ref Rng & Units 06/26/2017 03/06/2017 12/15/2016  WBC 3.9 - 10.3 10e3/uL 6.9 4.9 6.7  Hemoglobin 11.6 - 15.9 g/dL 13.1 12.7 12.2  Hematocrit 34.8 - 46.6 % 38.7 37.0 36.2  Platelets 145 - 400 10e3/uL 238 261 242   CMP Latest Ref Rng & Units 06/26/2017 06/26/2017 03/06/2017  Glucose 70 - 140 mg/dl 92 - 96  BUN 7.0 -  26.0 mg/dL 18.1 - 22.5  Creatinine 0.6 - 1.1 mg/dL 0.9 - 0.9  Sodium 136 - 145 mEq/L 138 - 139  Potassium 3.5 - 5.1 mEq/L 4.4 - 4.4  Chloride 96 - 112 mEq/L - - -  CO2 22 - 29 mEq/L 27 - 23  Calcium 8.4 - 10.4 mg/dL 9.5 - 9.3  Total Protein 6.0 - 8.5 g/dL 9.0(H) 8.3 9.0(H)  Total Bilirubin 0.20 - 1.20 mg/dL 0.71 - 0.67  Alkaline Phos 40 - 150 U/L 59 - 55  AST 5 - 34 U/L 21 - 20  ALT 0 - 55 U/L 19 - 19    Imaging: Xr Ankle 2 Views Left  Result Date: 10/19/2017 No joint space narrowing. Osteophyte present on distal tibia. Calcaneal spur noted. Impression: No joint space narrowing of left ankle.   Xr Ankle 2 Views Right  Result Date: 10/19/2017 No joint space narrowing. Calcaneal spur and calcification of plantar fascia. Impression: No joint space narrowing of right ankle.  Xr Foot 2 Views Left  Result Date: 10/19/2017 First MTP narrowing.  Subluxation of the 2nd MTP joint. All PIP and DIP narrowing. No erosive changes.  NO intertarsal joint space narrowing.  Dorsal spur noted. Impression: Findings are consistent with osteoarthritis of the  foot.  Xr Foot 2 Views Right  Result Date: 10/19/2017 First MTP joint space narrowing. PIP and DIP joint space narrowing.  Dorsal spurring present.  Calcaneal spurring noted.  Impression: Findings consistent with osteoarthritis.    Xr Hand 2 View Left  Result Date: 10/19/2017 Severe CMC narrowing.  All PIP and DIP narrowing.  No intercarpal space or radiocarpal space narrowing. No erosive changes.  Impression: Findings consistent with osteoarthritis of the hand.   Xr Hand 2 View Right  Result Date: 10/19/2017 Severe CMC narrowing.  All PIP and DIP narrowing.  No intercarpal space or radiocarpal space narrowing. No erosive changes.  Impression: Findings consistent with osteoarthritis of the hand.   Xr Shoulder Left  Result Date: 10/11/2017 Films of the left shoulder obtained in the AP and lateral projections. Minimal osteophyte formation of the inferior aspect of the humeral head. Otherwise humeral head centered about the glenoid. No ectopic calcification. Normal space between the humeral head and the acromion.   Speciality Comments: No specialty comments available.    Procedures:  No procedures performed Allergies: Patient has no known allergies.   Assessment / Plan:     Visit Diagnoses: Arthralgia of multiple joints  Pain in both hands -patient has no synovitis on examination although she does have tenderness on palpation of her wrist joints and PIP/DIP joints. No synovitis was noted. Plan: XR Hand 2 View Right, XR Hand 2 View Left. X-rays revealed findings consistent with the osteoarthritis.  Primary osteoarthritis of right knee: I reviewed her prior x-ray report which reports osteoarthritis.  Effusion, right knee -patient gives history of recurrent effusion in her right knee joint. She denies any history of synovial fluid analysis in the past. She has finished Euflexxa injection yesterday. My plan is to aspirate her knee and future. I'll obtain following labs to complete all  autoimmune workup. Plan: Rheumatoid factor, Cyclic citrul peptide antibody, IgG, Uric acid, HLA-B27 antigen, Sedimentation rate. Use of natural anti-inflammatories was discussed.  Bilateral ankle pain, unspecified chronicity - Plan: XR Ankle 2 Views Right, XR Ankle 2 Views Left. The x-ray of ankle joints were unremarkable.  Bilateral foot pain - she has osteoarthritis in her feet with hammertoes and also injury to her left second  toe which causes discomfort. No synovitis was noted. Plan: XR Foot 2 Views Right, XR Foot 2 Views Left. X-rays today were consistent with osteoarthritis in her feet. She also has dorsal spurs and calcaneal spurs.  History of repair of bilateral rotator cufff: She does have chronic discomfort in her shoulders.  Other medical problems are listed as follows:  History of vitamin D deficiency  History of diverticulosis  History of hyperlipidemia  History of skin cancer  Smoldering multiple myeloma ([place])  History of atrial fibrillation    Orders: Orders Placed This Encounter  Procedures  . XR Ankle 2 Views Right  . XR Ankle 2 Views Left  . XR Foot 2 Views Right  . XR Foot 2 Views Left  . XR Hand 2 View Right  . XR Hand 2 View Left  . Rheumatoid factor  . Cyclic citrul peptide antibody, IgG  . Uric acid  . HLA-B27 antigen  . Sedimentation rate   No orders of the defined types were placed in this encounter.   Face-to-face time spent with patient was 45 minutes. Greater than 50% of time was spent in counseling and coordination of care.  Follow-Up Instructions: Return for Osteoarthritis.   Bo Merino, MD  Note - This record has been created using Editor, commissioning.  Chart creation errors have been sought, but may not always  have been located. Such creation errors do not reflect on  the standard of medical care.

## 2017-10-18 NOTE — Progress Notes (Signed)
Office Visit Note   Patient: Jennifer [name]           Date of Birth: 09-18-1946           MRN: [id number] Visit Date: 10/18/2017              Requested by: [name], MD No address on file PCP: [name], MD   Assessment & Plan: Visit Diagnoses:  1. Primary osteoarthritis of right knee   2. Chronic left shoulder pain     Plan: Third U flexor injection right knee. We will schedule MRI scan of left shoulder with persistent pain and suspicion of rotator cuff tear  Follow-Up Instructions: Return after MRI left shoulder.   Orders:  No orders of the defined types were placed in this encounter.  No orders of the defined types were placed in this encounter.     Procedures: Large Joint Inj: R knee on 10/18/2017 11:09 AM Indications: pain and diagnostic evaluation Details: 25 G 1.5 in needle, anteromedial approach  Arthrogram: No  Medications: 20 mg Sodium Hyaluronate 20 MG/2ML Procedure, treatment alternatives, risks and benefits explained, specific risks discussed. Consent was given by the patient. Immediately prior to procedure a time out was called to verify the correct patient, procedure, equipment, support staff and site/side marked as required. Patient was prepped and draped in the usual sterile fashion.       Clinical Data: No additional findings.   Subjective: No chief complaint on file. Better with U flexor injection right knee. Still having a problem with left shoulder pain particularly with overhead activities. I suspect she may have recurrent rotator cuff tear  HPI  Review of Systems   Objective: Vital Signs: LMP 11/07/1996   Physical Exam  Ortho Exam right knee without effusion or increased heat. Minimal medial joint pain. We'll proceed with U flexor. Left shoulder with positive impingement and empty can testing. We'll proceed with MRI scan  Specialty Comments:  No specialty comments available.  Imaging: No results found.   PMFS  History: Patient Active Problem List   Diagnosis Date Noted  . Unilateral primary osteoarthritis, right knee 08/23/2017  . Vitamin D deficiency 12/27/2015  . Smoldering multiple myeloma ([place]) 12/22/2015  . Hypergammaglobulinemia   . IgG monoclonal gammopathy of uncertain significance   . Abnormal gamma globulin level 04/14/2015  . Hyperlipidemia 04/03/2015  . Hyperglycemia 04/03/2015  . Diverticulosis of colon without hemorrhage 04/03/2015  . Arthralgia of multiple joints 04/03/2015  . Osteoarthritis 01/20/2010  . SKIN CANCER, HX OF 01/20/2010   Past Medical History:  Diagnosis Date  . Atrial fibrillation ([place]) 12/2010   PMH of ; [place] , [place] [facility]  . Blood transfusion during current hospitalization 9675   complication after childbirth  . Cancer ([place]) 2000   SKIN CANCER IN SCALP  . DJD (degenerative joint disease)   . Skin cancer of face 2015   Patient reports this was likely basal cell on the right side of her face needing 14 stitches.  . Smoldering multiple myeloma ([place]) 2016    Family History  Problem Relation Age of Onset  . Atrial fibrillation Father   . Asthma Brother   . Alzheimer's disease Mother   . Myasthenia gravis Mother        ocular  . Breast cancer Paternal Grandmother   . Depression Daughter   . Colon cancer Neg Hx   . Rectal cancer Neg Hx   . Stomach cancer Neg Hx   . Diabetes Neg  Hx   . Stroke Neg Hx   . Heart attack Neg Hx     Past Surgical History:  Procedure Laterality Date  . CHOLECYSTECTOMY  1990's  . COLONOSCOPY  02/2013   negative X 3; Dr Olevia Perches  . G 3 P 1    . SHOULDER SURGERY Right 2012   R ; Dr Durward Fortes for rotator tear  . SHOULDER SURGERY Left 06/2014   Dr. Sydnee Cabal for rotator cuff  . T&A  child  . WISDOM TOOTH EXTRACTION  teenager   Social History   Occupational History  . Not on file  Tobacco Use  . Smoking status: Former Smoker    Packs/day: 0.25    Years: 3.00    Pack years: 0.75    Last attempt to quit: 11/07/1970     Years since quitting: 46.9  . Smokeless tobacco: Never Used  . Tobacco comment: 1/4 ppd [id number]  Substance and Sexual Activity  . Alcohol use: Yes    Alcohol/week: 6.0 oz    Types: 10 Glasses of wine per week  . Drug use: No  . Sexual activity: No    Partners: Male    Birth control/protection: Abstinence     Garald Balding, MD   Note - This record has been created using Editor, commissioning.  Chart creation errors have been sought, but may not always  have been located. Such creation errors do not reflect on  the standard of medical care.

## 2017-10-19 ENCOUNTER — Ambulatory Visit (INDEPENDENT_AMBULATORY_CARE_PROVIDER_SITE_OTHER): Payer: Self-pay

## 2017-10-19 ENCOUNTER — Ambulatory Visit (INDEPENDENT_AMBULATORY_CARE_PROVIDER_SITE_OTHER): Payer: Medicare Other | Admitting: Rheumatology

## 2017-10-19 ENCOUNTER — Encounter: Payer: Self-pay | Admitting: Rheumatology

## 2017-10-19 VITALS — BP 145/76 | HR 61 | Resp 15 | Ht 66.5 in | Wt 145.0 lb

## 2017-10-19 DIAGNOSIS — M79642 Pain in left hand: Secondary | ICD-10-CM | POA: Diagnosis not present

## 2017-10-19 DIAGNOSIS — M25461 Effusion, right knee: Secondary | ICD-10-CM | POA: Diagnosis not present

## 2017-10-19 DIAGNOSIS — C9000 Multiple myeloma not having achieved remission: Secondary | ICD-10-CM

## 2017-10-19 DIAGNOSIS — Z8679 Personal history of other diseases of the circulatory system: Secondary | ICD-10-CM

## 2017-10-19 DIAGNOSIS — M25572 Pain in left ankle and joints of left foot: Secondary | ICD-10-CM

## 2017-10-19 DIAGNOSIS — M2550 Pain in unspecified joint: Secondary | ICD-10-CM | POA: Diagnosis not present

## 2017-10-19 DIAGNOSIS — Z8639 Personal history of other endocrine, nutritional and metabolic disease: Secondary | ICD-10-CM

## 2017-10-19 DIAGNOSIS — Z85828 Personal history of other malignant neoplasm of skin: Secondary | ICD-10-CM

## 2017-10-19 DIAGNOSIS — M25571 Pain in right ankle and joints of right foot: Secondary | ICD-10-CM

## 2017-10-19 DIAGNOSIS — Z9889 Other specified postprocedural states: Secondary | ICD-10-CM

## 2017-10-19 DIAGNOSIS — M79672 Pain in left foot: Secondary | ICD-10-CM

## 2017-10-19 DIAGNOSIS — M1711 Unilateral primary osteoarthritis, right knee: Secondary | ICD-10-CM | POA: Diagnosis not present

## 2017-10-19 DIAGNOSIS — Z8719 Personal history of other diseases of the digestive system: Secondary | ICD-10-CM | POA: Diagnosis not present

## 2017-10-19 DIAGNOSIS — D472 Monoclonal gammopathy: Secondary | ICD-10-CM

## 2017-10-19 DIAGNOSIS — M79671 Pain in right foot: Secondary | ICD-10-CM

## 2017-10-19 DIAGNOSIS — M79641 Pain in right hand: Secondary | ICD-10-CM

## 2017-10-19 NOTE — Patient Instructions (Signed)
Natural anti-inflammatories  You can purchase these at [employer], [employer] or online.  . Turmeric (capsules)  . Ginger (ginger root or capsules)  . Omega 3 (Fish, flax seeds, chia seeds, walnuts, almonds)  . Tart cherry (dried or extract)   Patient should be under the care of a physician while taking these supplements. This may not be reproduced without the permission of Dr. Seabron Iannello.  

## 2017-10-24 LAB — CYCLIC CITRUL PEPTIDE ANTIBODY, IGG: Cyclic Citrullin Peptide Ab: <16 UNITS

## 2017-10-24 LAB — SEDIMENTATION RATE: Sed Rate: 65 mm/h — ABNORMAL HIGH (ref 0–30)

## 2017-10-24 LAB — RHEUMATOID FACTOR: Rhuematoid fact SerPl-aCnc: <14 IU/mL (ref <14)

## 2017-10-24 LAB — HLA-B27 ANTIGEN: HLA-B27 Antigen: NEGATIVE

## 2017-10-24 LAB — URIC ACID: Uric Acid, Serum: 5.4 mg/dL (ref 2.5–7.0)

## 2017-10-24 NOTE — Progress Notes (Signed)
All the labs are negative except for elevated ESR. Plan knee joint aspiration in the future

## 2017-10-26 ENCOUNTER — Ambulatory Visit (HOSPITAL_BASED_OUTPATIENT_CLINIC_OR_DEPARTMENT_OTHER): Payer: Medicare Other | Admitting: Hematology

## 2017-10-26 ENCOUNTER — Telehealth: Payer: Self-pay | Admitting: Hematology

## 2017-10-26 ENCOUNTER — Encounter: Payer: Self-pay | Admitting: Hematology

## 2017-10-26 ENCOUNTER — Other Ambulatory Visit (HOSPITAL_BASED_OUTPATIENT_CLINIC_OR_DEPARTMENT_OTHER): Payer: Medicare Other

## 2017-10-26 VITALS — BP 157/72 | HR 60 | Temp 97.9°F | Resp 20 | Ht 66.5 in | Wt 142.4 lb

## 2017-10-26 DIAGNOSIS — C9000 Multiple myeloma not having achieved remission: Secondary | ICD-10-CM | POA: Diagnosis not present

## 2017-10-26 DIAGNOSIS — D472 Monoclonal gammopathy: Secondary | ICD-10-CM

## 2017-10-26 LAB — COMPREHENSIVE METABOLIC PANEL
ALT: 18 U/L (ref 0–55)
AST: 17 U/L (ref 5–34)
Albumin: 3.7 g/dL (ref 3.5–5.0)
Alkaline Phosphatase: 59 U/L (ref 40–150)
Anion Gap: 7 mEq/L (ref 3–11)
BUN: 17.9 mg/dL (ref 7.0–26.0)
CO2: 24 mEq/L (ref 22–29)
Calcium: 9.2 mg/dL (ref 8.4–10.4)
Chloride: 107 mEq/L (ref 98–109)
Creatinine: 0.9 mg/dL (ref 0.6–1.1)
EGFR: >60 mL/min/{1.73_m2} (ref >60)
Glucose: 91 mg/dl (ref 70–140)
Potassium: 5.1 mEq/L (ref 3.5–5.1)
Sodium: 138 mEq/L (ref 136–145)
Total Bilirubin: 0.48 mg/dL (ref 0.20–1.20)
Total Protein: 8.8 g/dL — ABNORMAL HIGH (ref 6.4–8.3)

## 2017-10-26 LAB — CBC & DIFF AND RETIC
BASO%: 0.2 % (ref 0.0–2.0)
Basophils Absolute: 0 10*3/uL (ref 0.0–0.1)
EOS%: 1.5 % (ref 0.0–7.0)
Eosinophils Absolute: 0.1 10*3/uL (ref 0.0–0.5)
HCT: 36.2 % (ref 34.8–46.6)
HGB: 12.4 g/dL (ref 11.6–15.9)
Immature Retic Fract: 4.7 % (ref 1.60–10.00)
LYMPH%: 49.9 % — ABNORMAL HIGH (ref 14.0–49.7)
MCH: 32.9 pg (ref 25.1–34.0)
MCHC: 34.3 g/dL (ref 31.5–36.0)
MCV: 96 fL (ref 79.5–101.0)
MONO#: 0.2 10*3/uL (ref 0.1–0.9)
MONO%: 4 % (ref 0.0–14.0)
NEUT#: 2.1 10*3/uL (ref 1.5–6.5)
NEUT%: 44.4 % (ref 38.4–76.8)
Platelets: 235 10*3/uL (ref 145–400)
RBC: 3.77 10*6/uL (ref 3.70–5.45)
RDW: 12.7 % (ref 11.2–14.5)
Retic %: 1.67 % (ref 0.70–2.10)
Retic Ct Abs: 62.96 10*3/uL (ref 33.70–90.70)
WBC: 4.7 10*3/uL (ref 3.9–10.3)
lymph#: 2.4 10*3/uL (ref 0.9–3.3)

## 2017-10-26 NOTE — Progress Notes (Signed)
HEMATOLOGY/ONCOLOGY CLINIC NOTE  Date of Service: 06/26/2017    Patient Care Team: [name], MD as PCP - General (Family Medicine)  CHIEF COMPLAINTS/PURPOSE OF CONSULTATION:   Follow up for smoldering multiple myeloma  Diagnosis: Smoldering multiple myeloma  Treatment: Close monitoring  HISTORY OF PRESENTING ILLNESS: please see my initial consultation for details of her initial presentation  INTERVAL HISTORY  Jennifer [name] is here for her scheduled interval follow-up. She reports that she has been doing relatively well in the interim. She has been actively playing pickle ball which she loves. She continues to have some chronic knee and ankle pain, but she is being followed by Dr Durward Fortes for this of orthopedics. She has been referred to Rheumatology for this as well. No new or acute pains, no focal pains towards the center of the body. No new fatigue. She denies any headaches.   On review of systems, pt denies fever, chills, rash, mouth sores, weight loss, decreased appetite, urinary complaints. Denies new or acute pain. Pt denies abdominal pain, nausea, vomiting. Pertinent positives are listed and detailed within the above HPI.   MEDICAL HISTORY:  Past Medical History:  Diagnosis Date  . Atrial fibrillation ([place]) 12/2010   PMH of ; [place] , [place] [facility]  . Blood transfusion during current hospitalization 4650   complication after childbirth  . Cancer ([place]) 2000   SKIN CANCER IN [place]  . DJD (degenerative joint disease)   . Skin cancer of face 2015   Patient reports this was likely basal cell on the right side of her face needing 14 stitches.  . Smoldering multiple myeloma ([place]) 2016   . Patient Active Problem List   Diagnosis Date Noted  . Unilateral primary osteoarthritis, right knee 08/23/2017  . Vitamin D deficiency 12/27/2015  . Smoldering multiple myeloma ([place]) 12/22/2015  . Hypergammaglobulinemia   . IgG monoclonal gammopathy of uncertain significance   .  Abnormal gamma globulin level 04/14/2015  . Hyperlipidemia 04/03/2015  . Hyperglycemia 04/03/2015  . Diverticulosis of colon without hemorrhage 04/03/2015  . Arthralgia of multiple joints 04/03/2015  . Osteoarthritis 01/20/2010  . SKIN CANCER, HX OF 01/20/2010    SURGICAL HISTORY: Past Surgical History:  Procedure Laterality Date  . CHOLECYSTECTOMY  1990's  . COLONOSCOPY  02/2013   negative X 3; Dr Olevia Perches  . G 3 P 1    . SHOULDER SURGERY Right 2012   R ; Dr Durward Fortes for rotator tear  . SHOULDER SURGERY Left 06/2014   Dr. Sydnee Cabal for rotator cuff  . T&A  child  . WISDOM TOOTH EXTRACTION  teenager    SOCIAL HISTORY: Social History   Socioeconomic History  . Marital status: Married    Spouse name: Not on file  . Number of children: Not on file  . Years of education: Not on file  . Highest education level: Not on file  Social Needs  . Financial resource strain: Not on file  . Food insecurity - worry: Not on file  . Food insecurity - inability: Not on file  . Transportation needs - medical: Not on file  . Transportation needs - non-medical: Not on file  Occupational History  . Not on file  Tobacco Use  . Smoking status: Former Smoker    Packs/day: 0.25    Years: 3.00    Pack years: 0.75    Last attempt to quit: 11/07/1970    Years since quitting: 47.0  . Smokeless tobacco: Never Used  . Tobacco comment:  1/4 ppd [id number]  Substance and Sexual Activity  . Alcohol use: Yes    Alcohol/week: 6.0 oz    Types: 10 Glasses of wine per week  . Drug use: No  . Sexual activity: No    Partners: Male    Birth control/protection: Abstinence  Other Topics Concern  . Not on file  Social History Narrative  . Not on file    FAMILY HISTORY: Family History  Problem Relation Age of Onset  . Atrial fibrillation Father   . Asthma Brother   . Alzheimer's disease Mother   . Myasthenia gravis Mother        ocular  . Breast cancer Paternal Grandmother   . Depression Daughter     . Colon cancer Neg Hx   . Rectal cancer Neg Hx   . Stomach cancer Neg Hx   . Diabetes Neg Hx   . Stroke Neg Hx   . Heart attack Neg Hx     ALLERGIES:  has No Known Allergies.  MEDICATIONS:  Current Outpatient Medications  Medication Sig Dispense Refill  . ibuprofen (ADVIL,MOTRIN) 200 MG tablet Take 200 mg by mouth 2 (two) times daily.    . Turmeric (CURCUMIN 95 PO) Take by mouth daily.    . TURMERIC PO Take by mouth daily.     No current facility-administered medications for this visit.     REVIEW OF SYSTEMS:    10 Point review of Systems was done is negative except as noted above.  PHYSICAL EXAMINATION: ECOG PERFORMANCE STATUS: 0-1 .BP (!) 157/72 (BP Location: Right Arm, Patient Position: Sitting)   Pulse 60   Temp 97.9 F (36.6 C) (Oral)   Resp 20   Ht 5' 6.5" (1.689 m)   Wt 142 lb 6.4 oz (64.6 kg)   LMP 11/07/1996   SpO2 100%   BMI 22.64 kg/m   Filed Weights   10/26/17 1113  Weight: 142 lb 6.4 oz (64.6 kg)   .Body mass index is 22.64 kg/m.  GENERAL:alert, in no acute distress and comfortable SKIN: skin color, texture, turgor are normal, no rashes or significant lesions EYES: normal, conjunctiva are pink and non-injected, sclera clear OROPHARYNX:no exudate, no erythema and lips, buccal mucosa, and tongue normal  NECK: supple, no JVD, thyroid normal size, non-tender, without nodularity LYMPH:  no palpable lymphadenopathy in the cervical, axillary or inguinal LUNGS: clear to auscultation with normal respiratory effort HEART: regular rate & rhythm,  no murmurs and no lower extremity edema ABDOMEN: abdomen soft, non-tender, normoactive bowel sounds  Musculoskeletal: no cyanosis of digits and no clubbing  PSYCH: alert & oriented x 3 with fluent speech NEURO: no focal motor/sensory deficits  LABORATORY DATA:  I have reviewed the data as listed  . CBC Latest Ref Rng & Units 10/26/2017 06/26/2017 03/06/2017  WBC 3.9 - 10.3 10e3/uL 4.7 6.9 4.9  Hemoglobin 11.6  - 15.9 g/dL 12.4 13.1 12.7  Hematocrit 34.8 - 46.6 % 36.2 38.7 37.0  Platelets 145 - 400 10e3/uL 235 238 261    . CMP Latest Ref Rng & Units 10/26/2017 10/26/2017 06/26/2017  Glucose 70 - 140 mg/dl 91 - 92  BUN 7.0 - 26.0 mg/dL 17.9 - 18.1  Creatinine 0.6 - 1.1 mg/dL 0.9 - 0.9  Sodium 136 - 145 mEq/L 138 - 138  Potassium 3.5 - 5.1 mEq/L 5.1 - 4.4  Chloride 96 - 112 mEq/L - - -  CO2 22 - 29 mEq/L 24 - 27  Calcium 8.4 - 10.4 mg/dL 9.2 -  9.5  Total Protein 6.0 - 8.5 g/dL 8.8(H) 8.1 9.0(H)  Total Bilirubin 0.20 - 1.20 mg/dL 0.48 - 0.71  Alkaline Phos 40 - 150 U/L 59 - 59  AST 5 - 34 U/L 17 - 21  ALT 0 - 55 U/L 18 - 19         RADIOGRAPHIC STUDIES: I have personally reviewed the radiological images as listed and agreed with the findings in the report. Xr Ankle 2 Views Left  Result Date: 10/19/2017 No joint space narrowing. Osteophyte present on distal tibia. Calcaneal spur noted. Impression: No joint space narrowing of left ankle.   Xr Ankle 2 Views Right  Result Date: 10/19/2017 No joint space narrowing. Calcaneal spur and calcification of plantar fascia. Impression: No joint space narrowing of right ankle.  Xr Foot 2 Views Left  Result Date: 10/19/2017 First MTP narrowing.  Subluxation of the 2nd MTP joint. All PIP and DIP narrowing. No erosive changes.  NO intertarsal joint space narrowing.  Dorsal spur noted. Impression: Findings are consistent with osteoarthritis of the foot.  Xr Foot 2 Views Right  Result Date: 10/19/2017 First MTP joint space narrowing. PIP and DIP joint space narrowing.  Dorsal spurring present.  Calcaneal spurring noted.  Impression: Findings consistent with osteoarthritis.    Xr Hand 2 View Left  Result Date: 10/19/2017 Severe CMC narrowing.  All PIP and DIP narrowing.  No intercarpal space or radiocarpal space narrowing. No erosive changes.  Impression: Findings consistent with osteoarthritis of the hand.   Xr Hand 2 View Right  Result  Date: 10/19/2017 Severe CMC narrowing.  All PIP and DIP narrowing.  No intercarpal space or radiocarpal space narrowing. No erosive changes.  Impression: Findings consistent with osteoarthritis of the hand.   Xr Shoulder Left  Result Date: 10/11/2017 Films of the left shoulder obtained in the AP and lateral projections. Minimal osteophyte formation of the inferior aspect of the humeral head. Otherwise humeral head centered about the glenoid. No ectopic calcification. Normal space between the humeral head and the acromion.  Normal DEXA scan in June 2015     Cytogenetic analysis: Revealed the presence of a normal female chromosomes with no observable clonal chromosomal abnormalities.   ASSESSMENT & PLAN:   71 year old Caucasian female in good overall health with  #1Smoldering Multiple Myeloma IgG lambda. Bone marrow plasma cells more than 10% ( noted to have 35% clonal plasma cells] Normal female karyotype cytogenetics with no other abnormalities SPEP shows M spike of 2.2 (slightly decrease/stabiliyt from previous levels) with IFE showing IgG lambda paraprotein. Associated with some Immunoparesis characterized by associated decrease in IgA and IgM.  M protein 2.6--> 2.6---> 2.2--->2.4--->2.3--> 2.6-->2.4->2.5--> 2.2  SFLC K/L ratio low nl. Myeloma panel shows imprved M spike Skeletal survey shows no evidence of bone lesions.  Patient on follow-up today is feeling good and has no new focal symptoms or fatigue. No issues with infections. No renal insuff/hyprecalcemia or new anemia. Plan -no overt indication for treatment of Jennifer [name]'s SMM at this time. -No overt anemia, renal insufficiency, hypercalcemia or focal bone pains to suggest progression to overt myeloma at this time. -will hold off on rpt BM Bx in the absence of significant new clinical concerns or significant increase in M protein levels. -Would recommend yearly flu shots  -We will repeat bone survey in the near future to  evaluate for any evidence of myeloma progression.  Labs in 14 weeks Bone survey in 14 weeks RTC with Dr Irene Limbo in 16 weeks  All off the patients questions were answered to her apparent satisfaction. The patient knows to call the clinic with any problems, questions or concerns.  I spent 15 minutes counseling the patient face to face. The total time spent in the appointment was 20 minutes and more than 50% was on counseling and direct patient cares.    Sullivan Lone MD [place] [facility]On[facility] Physician [place]  (Office):       [phone] (Work cell):  [phone] (Fax):           [phone]   This document serves as a record of services personally performed by Sullivan Lone, MD. It was created on his behalf by Reola Mosher, a trained medical scribe. The creation of this record is based on the scribe's personal observations and the provider's statements to them.   .I have reviewed the above documentation for accuracy and completeness, and I agree with the above. Brunetta Genera MD Jennifer

## 2017-10-26 NOTE — Telephone Encounter (Signed)
Scheduled appt per 12/20 los - Gave patient AVS and calender per los.  

## 2017-10-27 LAB — KAPPA/LAMBDA LIGHT CHAINS
Ig Kappa Free Light Chain: 5.5 mg/L (ref 3.3–19.4)
Ig Lambda Free Light Chain: 12.8 mg/L (ref 5.7–26.3)
Kappa/Lambda FluidC Ratio: 0.43 (ref 0.26–1.65)

## 2017-10-30 ENCOUNTER — Encounter: Payer: Self-pay | Admitting: Rheumatology

## 2017-10-30 LAB — MULTIPLE MYELOMA PANEL, SERUM
Albumin SerPl Elph-Mcnc: 3.8 g/dL (ref 2.9–4.4)
Albumin/Glob SerPl: 0.9 (ref 0.7–1.7)
Alpha 1: 0.2 g/dL (ref 0.0–0.4)
Alpha2 Glob SerPl Elph-Mcnc: 0.6 g/dL (ref 0.4–1.0)
B-Globulin SerPl Elph-Mcnc: 0.8 g/dL (ref 0.7–1.3)
Gamma Glob SerPl Elph-Mcnc: 2.6 g/dL — ABNORMAL HIGH (ref 0.4–1.8)
Globulin, Total: 4.3 g/dL — ABNORMAL HIGH (ref 2.2–3.9)
IgA, Qn, Serum: 15 mg/dL — ABNORMAL LOW (ref 64–422)
IgG, Qn, Serum: 3385 mg/dL — ABNORMAL HIGH (ref 700–1600)
IgM, Qn, Serum: 5 mg/dL — ABNORMAL LOW (ref 26–217)
M Protein SerPl Elph-Mcnc: 2.2 g/dL — ABNORMAL HIGH
Total Protein: 8.1 g/dL (ref 6.0–8.5)

## 2017-11-01 ENCOUNTER — Encounter (INDEPENDENT_AMBULATORY_CARE_PROVIDER_SITE_OTHER): Payer: Self-pay

## 2017-11-01 ENCOUNTER — Encounter: Payer: Self-pay | Admitting: Rheumatology

## 2017-11-02 DIAGNOSIS — M9902 Segmental and somatic dysfunction of thoracic region: Secondary | ICD-10-CM | POA: Diagnosis not present

## 2017-11-02 DIAGNOSIS — M9905 Segmental and somatic dysfunction of pelvic region: Secondary | ICD-10-CM | POA: Diagnosis not present

## 2017-11-02 DIAGNOSIS — M9903 Segmental and somatic dysfunction of lumbar region: Secondary | ICD-10-CM | POA: Diagnosis not present

## 2017-11-02 DIAGNOSIS — M461 Sacroiliitis, not elsewhere classified: Secondary | ICD-10-CM | POA: Diagnosis not present

## 2017-11-03 ENCOUNTER — Encounter: Payer: Self-pay | Admitting: Orthopaedic Surgery

## 2017-11-03 DIAGNOSIS — M7521 Bicipital tendinitis, right shoulder: Secondary | ICD-10-CM | POA: Diagnosis not present

## 2017-11-03 DIAGNOSIS — M19011 Primary osteoarthritis, right shoulder: Secondary | ICD-10-CM | POA: Diagnosis not present

## 2017-11-03 DIAGNOSIS — S43081A Other subluxation of right shoulder joint, initial encounter: Secondary | ICD-10-CM | POA: Diagnosis not present

## 2017-11-03 DIAGNOSIS — M75102 Unspecified rotator cuff tear or rupture of left shoulder, not specified as traumatic: Secondary | ICD-10-CM | POA: Diagnosis not present

## 2017-11-03 DIAGNOSIS — S46011A Strain of muscle(s) and tendon(s) of the rotator cuff of right shoulder, initial encounter: Secondary | ICD-10-CM | POA: Diagnosis not present

## 2017-11-03 DIAGNOSIS — M7581 Other shoulder lesions, right shoulder: Secondary | ICD-10-CM | POA: Diagnosis not present

## 2017-11-03 DIAGNOSIS — S46212A Strain of muscle, fascia and tendon of other parts of biceps, left arm, initial encounter: Secondary | ICD-10-CM | POA: Diagnosis not present

## 2017-11-08 ENCOUNTER — Ambulatory Visit (INDEPENDENT_AMBULATORY_CARE_PROVIDER_SITE_OTHER): Payer: Medicare Other | Admitting: Orthopaedic Surgery

## 2017-11-08 ENCOUNTER — Encounter (INDEPENDENT_AMBULATORY_CARE_PROVIDER_SITE_OTHER): Payer: Self-pay | Admitting: Orthopaedic Surgery

## 2017-11-08 VITALS — BP 116/66 | HR 56 | Resp 12 | Ht 67.0 in | Wt 135.0 lb

## 2017-11-08 DIAGNOSIS — M25512 Pain in left shoulder: Secondary | ICD-10-CM | POA: Diagnosis not present

## 2017-11-08 DIAGNOSIS — G8929 Other chronic pain: Secondary | ICD-10-CM | POA: Diagnosis not present

## 2017-11-08 NOTE — Progress Notes (Signed)
Office Visit Note   Patient: Jennifer [name]           Date of Birth: [birth year]           MRN: [id number] Visit Date: 11/08/2017              Requested by: [name], MD No address on file PCP: [name], MD   Assessment & Plan: Visit Diagnoses:  1. Chronic left shoulder pain     Plan: MRI scan performed at [facility] at the [facility] 12/28. Demonstrates 8 mm full-thickness tear of the posterior fibers of the supraspinatus tendon with 2.4 cm of retraction. 50-75% thickness of the undersurface partial tear of the remaining middle to posterior fibers of the supraspinatus. There is severe undersurface fiber tearing of the subscapularis. There is evidence of a prior acromioplasty and distal clavicle resection. The acromium is flattened without any evidence of acromioclavicular joint hypertrophy and no compression or impression upon the supraspinatus myotendinous junction. There is moderate medial subluxation of the proximal biceps tendon with moderate partial tearing. There is severe proximal biceps tendinosis. There also was moderate glenohumeral joint osteoarthritis with areas of articular cartilage thinning. On discussion regarding the findings. Jennifer [name] would like to pursue a nonoperative treatment. She does play pickle ball on a consistent basis and wants to continue that. She is aware of the surgical implications and time away from activities.  Follow-Up Instructions: Return if symptoms worsen or fail to improve.   Orders:  No orders of the defined types were placed in this encounter.  No orders of the defined types were placed in this encounter.     Procedures: No procedures performed   Clinical Data: No additional findings.   Subjective: Chief Complaint  Patient presents with  . Left Shoulder - Results    Jennifer [name] is a 72 y o here today for MRI results of Left shoulder. Left hand dominant.  Has some discomfort left shoulder with certain  kinds of activities. Really not having any pain along the biceps tendon. Neurologically intact  HPI  Review of Systems  Constitutional: Negative for chills, fatigue and fever.  Eyes: Negative for itching.  Respiratory: Negative for chest tightness and shortness of breath.   Cardiovascular: Negative for chest pain, palpitations and leg swelling.  Gastrointestinal: Negative for blood in stool, constipation and diarrhea.  Endocrine: Negative for polyuria.  Genitourinary: Negative for dysuria.  Musculoskeletal: Positive for neck pain and neck stiffness. Negative for back pain and joint swelling.  Allergic/Immunologic: Negative for immunocompromised state.  Neurological: Positive for headaches. Negative for dizziness and numbness.  Hematological: Does not bruise/bleed easily.  Psychiatric/Behavioral: The patient is not nervous/anxious.      Objective: Vital Signs: BP 116/66   Pulse (!) 56   Resp 12   Ht _0  (1.702 m)   Wt 135 lb (61.2 kg)   LMP 11/07/1996   BMI 21.14 kg/m   Physical Exam  Ortho Exam full quick overhead motion of her left shoulder. Positive speed sign. Biceps intact. Skin intact. No ecchymosis. Good grip and good release and only positive impingement. No popping or clicking. Appears to have good strength.  Specialty Comments:  No specialty comments available.  Imaging: No results found.   PMFS History: Patient Active Problem List   Diagnosis Date Noted  . Unilateral primary osteoarthritis, right knee 08/23/2017  . Vitamin D deficiency 12/27/2015  . Smoldering multiple myeloma ([place]) 12/22/2015  . Hypergammaglobulinemia   . IgG monoclonal gammopathy  of uncertain significance   . Abnormal gamma globulin level 04/14/2015  . Hyperlipidemia 04/03/2015  . Hyperglycemia 04/03/2015  . Diverticulosis of colon without hemorrhage 04/03/2015  . Arthralgia of multiple joints 04/03/2015  . Osteoarthritis 01/20/2010  . SKIN CANCER, HX OF 01/20/2010   Past Medical  History:  Diagnosis Date  . Atrial fibrillation ([place]) 12/2010   PMH of ; [place] , [place] [facility]  . Blood transfusion during current hospitalization 3496   complication after childbirth  . Cancer ([place]) 2000   SKIN CANCER IN [place]ALP  . DJD (degenerative joint disease)   . Skin cancer of face 2015   Patient reports this was likely basal cell on the right side of her face needing 14 stitches.  . Smoldering multiple myeloma ([place]) 2016    Family History  Problem Relation Age of Onset  . Atrial fibrillation Father   . Asthma Brother   . Alzheimer's disease Mother   . Myasthenia gravis Mother        ocular  . Breast cancer Paternal Grandmother   . Depression Daughter   . Colon cancer Neg Hx   . Rectal cancer Neg Hx   . Stomach cancer Neg Hx   . Diabetes Neg Hx   . Stroke Neg Hx   . Heart attack Neg Hx     Past Surgical History:  Procedure Laterality Date  . CHOLECYSTECTOMY  1990's  . COLONOSCOPY  02/2013   negative X 3; Dr Olevia Perches  . G 3 P 1    . SHOULDER SURGERY Right 2012   R ; Dr Durward Fortes for rotator tear  . SHOULDER SURGERY Left 06/2014   Dr. Sydnee Cabal for rotator cuff  . T&A  child  . WISDOM TOOTH EXTRACTION  teenager   Social History   Occupational History  . Not on file  Tobacco Use  . Smoking status: Former Smoker    Packs/day: 0.25    Years: 3.00    Pack years: 0.75    Last attempt to quit: 11/07/1970    Years since quitting: 47.0  . Smokeless tobacco: Never Used  . Tobacco comment: 1/4 ppd [id number]  Substance and Sexual Activity  . Alcohol use: Yes    Alcohol/week: 6.0 oz    Types: 10 Glasses of wine per week  . Drug use: No  . Sexual activity: No    Partners: Male    Birth control/protection: Abstinence

## 2017-11-11 ENCOUNTER — Telehealth: Payer: Self-pay

## 2017-11-11 NOTE — Telephone Encounter (Signed)
Pt sent message questioning elevation of lymphocyte percentage 49.9. Discussed with Dr. Irene Limbo who noted that this is WNL, and the pt should not be concerned. Called pt to relay message and she was thankful for the communication. No further questions at this time.

## 2017-11-15 DIAGNOSIS — J0100 Acute maxillary sinusitis, unspecified: Secondary | ICD-10-CM | POA: Diagnosis not present

## 2017-11-29 NOTE — Progress Notes (Signed)
Office Visit Note  Patient: Jennifer [name]             Date of Birth: [birth year]           MRN: [id number]             PCP: [name], MD Referring: [name], MD Visit Date: 12/01/2017 Occupation: _0 @    Subjective:  Recurrent right knee effusion.   History of Present Illness: Jennifer [name] is a 72 y.o. female with history of osteoarthritis. She's been having recurrent effusion in her right knee joint. She is not having much discomfort. She continues to have some stiffness in her hands knees and feet. She has some discomfort in her shoulders from rotator cuff tear. She has had bilateral rotator cuff tear repairs.  Activities of Daily Living:  Patient reports morning stiffness for 2 minute.   Patient Denies nocturnal pain.  Difficulty dressing/grooming: Denies Difficulty climbing stairs: Reports Difficulty getting out of chair: Reports Difficulty using hands for taps, buttons, cutlery, and/or writing: Denies   Review of Systems  Constitutional: Negative for fatigue, night sweats, weight gain, weight loss and weakness.  HENT: Negative for mouth sores, trouble swallowing, trouble swallowing, mouth dryness and nose dryness.   Eyes: Negative for pain, redness, visual disturbance and dryness.  Respiratory: Negative for cough, shortness of breath and difficulty breathing.   Cardiovascular: Negative for chest pain, palpitations, hypertension, irregular heartbeat and swelling in legs/feet.  Gastrointestinal: Negative for blood in stool, constipation and diarrhea.  Endocrine: Negative for increased urination.  Genitourinary: Negative for vaginal dryness.  Musculoskeletal: Positive for arthralgias, joint pain and morning stiffness. Negative for joint swelling, myalgias, muscle weakness, muscle tenderness and myalgias.  Skin: Negative for color change, rash, hair loss, skin tightness, ulcers and sensitivity to sunlight.  Allergic/Immunologic: Negative for susceptible to  infections.  Neurological: Negative for dizziness, memory loss and night sweats.  Hematological: Negative for swollen glands.  Psychiatric/Behavioral: Negative for depressed mood and sleep disturbance. The patient is not nervous/anxious.     PMFS History:  Patient Active Problem List   Diagnosis Date Noted  . Primary osteoarthritis of both knees 11/30/2017  . Primary osteoarthritis of both feet 11/30/2017  . History of rotator cuff tear repair, bilateral 11/30/2017  . Vitamin D deficiency 12/27/2015  . 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 Hypergammaglobulinemia   . IgG monoclonal gammopathy of uncertain significance   . Abnormal gamma globulin level 04/14/2015  . Hyperlipidemia 04/03/2015  . Hyperglycemia 04/03/2015  . Diverticulosis of colon without hemorrhage 04/03/2015  . Arthralgia of multiple joints 04/03/2015  . Primary osteoarthritis of both hands 01/20/2010  . SKIN CANCER, HX OF 01/20/2010    Past Medical History:  Diagnosis Date  . Atrial fibrillation ([place]) 12/2010   PMH of ; [place] , [place] [facility]  . Blood transfusion during current hospitalization 7353   complication after childbirth  . Cancer ([place]) 2000   SKIN CANCER IN [place]  . DJD (degenerative joint disease)   . Skin cancer of face 2015   Patient reports this was likely basal cell on the right side of her face needing 14 stitches.  . Smoldering multiple myeloma ([place]) 2016    Family History  Problem Relation Age of Onset  . Atrial fibrillation Father   . Asthma Brother   . Alzheimer's disease Mother   . Myasthenia gravis Mother        ocular  . Breast cancer Paternal Grandmother   . Depression Daughter   .  Colon cancer Neg Hx   . Rectal cancer Neg Hx   . Stomach cancer Neg Hx   . Diabetes Neg Hx   . Stroke Neg Hx   . Heart attack Neg Hx    Past Surgical History:  Procedure Laterality Date  . CHOLECYSTECTOMY  1990's  . COLONOSCOPY  02/2013   negative X 3; Dr Olevia Perches  . G 3 P 1    . SHOULDER  SURGERY Right 2012   R ; Dr [place] Fortes for rotator tear  . SHOULDER SURGERY Left 06/2014   Dr. Sydnee Cabal for rotator cuff  . T&A  child  . WISDOM TOOTH EXTRACTION  teenager   Social History   Social History Narrative  . Not on file     Objective: Vital Signs: BP 125/75 (BP Location: Left Arm, Patient Position: Sitting, Cuff Size: Normal)   Pulse (!) 56   Resp 14   Ht 5' 6.5" (1.689 m)   Wt 142 lb (64.4 kg)   LMP 11/07/1996   BMI 22.58 kg/m    Physical Exam  Constitutional: She is oriented to person, place, and time. She appears well-developed and well-nourished.  HENT:  Head: Normocephalic and atraumatic.  Eyes: Conjunctivae and EOM are normal.  Neck: Normal range of motion.  Cardiovascular: Normal rate, regular rhythm and intact distal pulses.  Murmur heard. Pulmonary/Chest: Effort normal and breath sounds normal.  Abdominal: Soft. Bowel sounds are normal.  Lymphadenopathy:    She has no cervical adenopathy.  Neurological: She is alert and oriented to person, place, and time.  Skin: Skin is warm and dry. Capillary refill takes less than 2 seconds.  Psychiatric: She has a normal mood and affect. Her behavior is normal.  Nursing note and vitals reviewed.    Musculoskeletal Exam: C-spine and thoracic lumbar spine good range of motion. She is some discomfort range of motion of her left shoulder joint. Elbow joints wrist joints MCPs with good range of motion. She has DIP PIP thickening in her hands and feet consistent with osteoarthritis. She is some warmth and swelling in her right knee joint without any effusion.  CDAI Exam: No CDAI exam completed.    Investigation: No additional findings. 10/19/2017 RF negative, anti-CCP negative, uric acid 5.4, HLA B27  negative, ESR 65  Imaging: No results found.  Speciality Comments: No specialty comments available.    Procedures:  No procedures performed Allergies: Patient has no known allergies.   Assessment / Plan:       Visit Diagnoses: Primary osteoarthritis of both hands: She has osteoarthritic changes in her hands which causes a stiffness. Joint protection and muscle strengthening discussed.  Primary osteoarthritis of both knees - History of recurrent knee effusion. She did not have any effusion present today although she did have some warmth and swelling in her right knee joint. I would like to aspirate her knee joint if she develops effusion again to do synovial fluid analysis. I've advised her to contact us in case she develops effusion. In the meantime she will continue to take natural anti-inflammatory and use topical diclofenac. All other autoimmune workup has been negative.   Primary osteoarthritis of both feet: Proper fitting shoes were discussed.  Elevated ESR: Most likely related to his smoldering multiple myeloma.  History of rotator cuff tear repair, bilateral: Chronic pain  Diverticulosis of colon without hemorrhage  Vitamin D deficiency: She is on supplement.  SKIN CANCER, HX OF  History of atrial fibrillation  Smoldering multiple myeloma ([place])  Orders: No orders of the defined types were placed in this encounter.  No orders of the defined types were placed in this encounter.   Face-to-face time spent with patient was 30 minutes. Greater than 50% of time was spent in counseling and coordination of care.  Follow-Up Instructions: Return if symptoms worsen or fail to improve, for Osteoarthritis.   Bo Merino, MD  Note - This record has been created using Editor, commissioning.  Chart creation errors have been sought, but may not always  have been located. Such creation errors do not reflect on  the standard of medical care.

## 2017-11-30 DIAGNOSIS — M19072 Primary osteoarthritis, left ankle and foot: Secondary | ICD-10-CM

## 2017-11-30 DIAGNOSIS — M19071 Primary osteoarthritis, right ankle and foot: Secondary | ICD-10-CM | POA: Insufficient documentation

## 2017-11-30 DIAGNOSIS — Z8739 Personal history of other diseases of the musculoskeletal system and connective tissue: Secondary | ICD-10-CM | POA: Insufficient documentation

## 2017-11-30 DIAGNOSIS — M170 Bilateral primary osteoarthritis of knee: Secondary | ICD-10-CM | POA: Insufficient documentation

## 2017-12-01 ENCOUNTER — Encounter: Payer: Self-pay | Admitting: Rheumatology

## 2017-12-01 ENCOUNTER — Ambulatory Visit (INDEPENDENT_AMBULATORY_CARE_PROVIDER_SITE_OTHER): Payer: Medicare Other | Admitting: Rheumatology

## 2017-12-01 VITALS — BP 125/75 | HR 56 | Resp 14 | Ht 66.5 in | Wt 142.0 lb

## 2017-12-01 DIAGNOSIS — Z85828 Personal history of other malignant neoplasm of skin: Secondary | ICD-10-CM

## 2017-12-01 DIAGNOSIS — E559 Vitamin D deficiency, unspecified: Secondary | ICD-10-CM

## 2017-12-01 DIAGNOSIS — M19071 Primary osteoarthritis, right ankle and foot: Secondary | ICD-10-CM

## 2017-12-01 DIAGNOSIS — Z8679 Personal history of other diseases of the circulatory system: Secondary | ICD-10-CM

## 2017-12-01 DIAGNOSIS — M19041 Primary osteoarthritis, right hand: Secondary | ICD-10-CM

## 2017-12-01 DIAGNOSIS — M19042 Primary osteoarthritis, left hand: Secondary | ICD-10-CM | POA: Diagnosis not present

## 2017-12-01 DIAGNOSIS — M19072 Primary osteoarthritis, left ankle and foot: Secondary | ICD-10-CM | POA: Diagnosis not present

## 2017-12-01 DIAGNOSIS — M170 Bilateral primary osteoarthritis of knee: Secondary | ICD-10-CM | POA: Diagnosis not present

## 2017-12-01 DIAGNOSIS — K5730 Diverticulosis of large intestine without perforation or abscess without bleeding: Secondary | ICD-10-CM

## 2017-12-01 DIAGNOSIS — D472 Monoclonal gammopathy: Secondary | ICD-10-CM

## 2017-12-01 DIAGNOSIS — Z8739 Personal history of other diseases of the musculoskeletal system and connective tissue: Secondary | ICD-10-CM | POA: Diagnosis not present

## 2017-12-01 DIAGNOSIS — C9000 Multiple myeloma not having achieved remission: Secondary | ICD-10-CM | POA: Diagnosis not present

## 2017-12-15 DIAGNOSIS — Z1231 Encounter for screening mammogram for malignant neoplasm of breast: Secondary | ICD-10-CM | POA: Diagnosis not present

## 2018-01-01 DIAGNOSIS — M205X2 Other deformities of toe(s) (acquired), left foot: Secondary | ICD-10-CM | POA: Diagnosis not present

## 2018-01-01 DIAGNOSIS — M2042 Other hammer toe(s) (acquired), left foot: Secondary | ICD-10-CM | POA: Diagnosis not present

## 2018-01-09 DIAGNOSIS — M2042 Other hammer toe(s) (acquired), left foot: Secondary | ICD-10-CM | POA: Diagnosis not present

## 2018-01-09 DIAGNOSIS — Z01810 Encounter for preprocedural cardiovascular examination: Secondary | ICD-10-CM | POA: Diagnosis not present

## 2018-01-09 DIAGNOSIS — S93525A Sprain of metatarsophalangeal joint of left lesser toe(s), initial encounter: Secondary | ICD-10-CM | POA: Diagnosis not present

## 2018-01-09 DIAGNOSIS — M205X2 Other deformities of toe(s) (acquired), left foot: Secondary | ICD-10-CM | POA: Diagnosis not present

## 2018-01-09 DIAGNOSIS — M205X9 Other deformities of toe(s) (acquired), unspecified foot: Secondary | ICD-10-CM | POA: Diagnosis not present

## 2018-01-09 DIAGNOSIS — M7742 Metatarsalgia, left foot: Secondary | ICD-10-CM | POA: Diagnosis not present

## 2018-01-09 DIAGNOSIS — S93145A Subluxation of metatarsophalangeal joint of left lesser toe(s), initial encounter: Secondary | ICD-10-CM | POA: Diagnosis not present

## 2018-01-12 DIAGNOSIS — A088 Other specified intestinal infections: Secondary | ICD-10-CM | POA: Diagnosis not present

## 2018-02-01 ENCOUNTER — Ambulatory Visit (HOSPITAL_COMMUNITY)
Admission: RE | Admit: 2018-02-01 | Discharge: 2018-02-01 | Disposition: A | Discharge status: Home / Self Care | Payer: Medicare Other | Source: Ambulatory Visit | Attending: Hematology | Admitting: Hematology

## 2018-02-01 ENCOUNTER — Inpatient Hospital Stay: Payer: Medicare Other | Attending: Hematology

## 2018-02-01 ENCOUNTER — Other Ambulatory Visit: Payer: Medicare Other

## 2018-02-01 DIAGNOSIS — M47896 Other spondylosis, lumbar region: Secondary | ICD-10-CM | POA: Diagnosis not present

## 2018-02-01 DIAGNOSIS — D472 Monoclonal gammopathy: Secondary | ICD-10-CM

## 2018-02-01 DIAGNOSIS — M47892 Other spondylosis, cervical region: Secondary | ICD-10-CM | POA: Insufficient documentation

## 2018-02-01 DIAGNOSIS — C9000 Multiple myeloma not having achieved remission: Secondary | ICD-10-CM

## 2018-02-01 DIAGNOSIS — R937 Abnormal findings on diagnostic imaging of other parts of musculoskeletal system: Secondary | ICD-10-CM | POA: Diagnosis not present

## 2018-02-01 LAB — COMPREHENSIVE METABOLIC PANEL
ALT: 18 U/L (ref 0–55)
AST: 16 U/L (ref 5–34)
Albumin: 3.5 g/dL (ref 3.5–5.0)
Alkaline Phosphatase: 59 U/L (ref 40–150)
Anion gap: 8 (ref 3–11)
BUN: 23 mg/dL (ref 7–26)
CO2: 26 mmol/L (ref 22–29)
Calcium: 9.7 mg/dL (ref 8.4–10.4)
Chloride: 103 mmol/L (ref 98–109)
Creatinine, Ser: 0.96 mg/dL (ref 0.60–1.10)
GFR calc Af Amer: >60 mL/min (ref >60)
GFR calc non Af Amer: 57 mL/min — ABNORMAL LOW (ref >60)
Glucose, Bld: 100 mg/dL (ref 70–140)
Potassium: 4.6 mmol/L (ref 3.5–5.1)
Sodium: 137 mmol/L (ref 136–145)
Total Bilirubin: 0.4 mg/dL (ref 0.2–1.2)
Total Protein: 9.3 g/dL — ABNORMAL HIGH (ref 6.4–8.3)

## 2018-02-01 LAB — CBC WITH DIFFERENTIAL (CANCER CENTER ONLY)
Basophils Absolute: 0 10*3/uL (ref 0.0–0.1)
Basophils Relative: 1 %
Eosinophils Absolute: 0 10*3/uL (ref 0.0–0.5)
Eosinophils Relative: 1 %
HCT: 35.9 % (ref 34.8–46.6)
Hemoglobin: 12.6 g/dL (ref 11.6–15.9)
Lymphocytes Relative: 40 %
Lymphs Abs: 2.3 10*3/uL (ref 0.9–3.3)
MCH: 32.6 pg (ref 25.1–34.0)
MCHC: 35 g/dL (ref 31.5–36.0)
MCV: 93 fL (ref 79.5–101.0)
Monocytes Absolute: 0.3 10*3/uL (ref 0.1–0.9)
Monocytes Relative: 5 %
Neutro Abs: 3 10*3/uL (ref 1.5–6.5)
Neutrophils Relative %: 53 %
Platelet Count: 293 10*3/uL (ref 145–400)
RBC: 3.85 MIL/uL (ref 3.70–5.45)
RDW: 12.8 % (ref 11.2–14.5)
WBC Count: 5.6 10*3/uL (ref 3.9–10.3)

## 2018-02-01 LAB — RETICULOCYTES
RBC.: 3.88 MIL/uL (ref 3.87–5.11)
Retic Count, Absolute: 77.6 10*3/uL (ref 19.0–186.0)
Retic Ct Pct: 2 % (ref 0.4–3.1)

## 2018-02-02 LAB — KAPPA/LAMBDA LIGHT CHAINS
Kappa free light chain: 7.3 mg/L (ref 3.3–19.4)
Kappa, lambda light chain ratio: 0.48 (ref 0.26–1.65)
Lambda free light chains: 15.3 mg/L (ref 5.7–26.3)

## 2018-02-05 DIAGNOSIS — Z4789 Encounter for other orthopedic aftercare: Secondary | ICD-10-CM | POA: Diagnosis not present

## 2018-02-05 DIAGNOSIS — Z96698 Presence of other orthopedic joint implants: Secondary | ICD-10-CM | POA: Diagnosis not present

## 2018-02-05 LAB — MULTIPLE MYELOMA PANEL, SERUM
Albumin SerPl Elph-Mcnc: 3.8 g/dL (ref 2.9–4.4)
Albumin/Glob SerPl: 0.8 (ref 0.7–1.7)
Alpha 1: 0.3 g/dL (ref 0.0–0.4)
Alpha2 Glob SerPl Elph-Mcnc: 0.7 g/dL (ref 0.4–1.0)
B-Globulin SerPl Elph-Mcnc: 1 g/dL (ref 0.7–1.3)
Gamma Glob SerPl Elph-Mcnc: 3.2 g/dL — ABNORMAL HIGH (ref 0.4–1.8)
Globulin, Total: 5.1 g/dL — ABNORMAL HIGH (ref 2.2–3.9)
IgA: 15 mg/dL — ABNORMAL LOW (ref 64–422)
IgG (Immunoglobin G), Serum: 4173 mg/dL — ABNORMAL HIGH (ref 700–1600)
IgM (Immunoglobulin M), Srm: <5 mg/dL — ABNORMAL LOW (ref 26–217)
M Protein SerPl Elph-Mcnc: 3 g/dL — ABNORMAL HIGH
Total Protein ELP: 8.9 g/dL — ABNORMAL HIGH (ref 6.0–8.5)

## 2018-02-07 ENCOUNTER — Encounter: Payer: Self-pay | Admitting: Hematology

## 2018-02-07 ENCOUNTER — Ambulatory Visit (INDEPENDENT_AMBULATORY_CARE_PROVIDER_SITE_OTHER): Payer: Medicare Other | Admitting: Orthopaedic Surgery

## 2018-02-07 ENCOUNTER — Encounter (INDEPENDENT_AMBULATORY_CARE_PROVIDER_SITE_OTHER): Payer: Self-pay | Admitting: Orthopaedic Surgery

## 2018-02-07 VITALS — BP 147/77 | HR 63 | Resp 18 | Ht 66.0 in | Wt 142.0 lb

## 2018-02-07 DIAGNOSIS — M25531 Pain in right wrist: Secondary | ICD-10-CM

## 2018-02-07 MED ORDER — METHYLPREDNISOLONE ACETATE 40 MG/ML IJ SUSP
40.0000 mg | INTRAMUSCULAR | Status: AC | PRN
  Administered 2018-02-07: 40 mg via INTRA_ARTICULAR

## 2018-02-07 MED ORDER — LIDOCAINE HCL (PF) 1 % IJ SOLN
1.0000 mL | INTRAMUSCULAR | Status: AC | PRN
  Administered 2018-02-07: 1 mL

## 2018-02-07 NOTE — Progress Notes (Signed)
Office Visit Note   Patient: Jennifer [name]           Date of Birth: 09/09/1946           MRN: [id number] Visit Date: 02/07/2018              Requested by: [name], MD No address on file PCP: [name], MD   Assessment & Plan: Visit Diagnoses:  1. Pain in right wrist     Plan: Insidious onset of right ulnar wrist pain without obvious injury or trauma.  Pain is been present for approximately 4 weeks.  Ambry is been trying ice and rest and still having some discomfort to the point where it compromises her ability to play pickle ball.  The ulnar carpal joint.  We will try a cortisone injection her response  Follow-Up Instructions: Return if symptoms worsen or fail to improve.   Orders:  Orders Placed This Encounter  Procedures  . Medium Joint Inj: R radiocarpal   No orders of the defined types were placed in this encounter.     Procedures: Medium Joint Inj: R radiocarpal on 02/07/2018 12:09 PM Details: anterolateral approach Medications: 1 mL lidocaine (PF) 1 %; 40 mg methylPREDNISolone acetate 40 MG/ML Outcome: tolerated well, no immediate complications      Clinical Data: No additional findings.   Subjective: Chief Complaint  Patient presents with  . Right Wrist - Pain    r wrist pain  and swelling surgery was 01-09-18  . New Patient (Initial Visit)    r wrist pain  and swelling surgery was 01-09-18  Insidious onset of ulnar-sided right wrist pain about 4 weeks ago.  May be related to her playing pickle ball.  No numbness or tingling.  Mild swelling in that area.  No dorsal or volar wrist pain.  Numbness or tingling cyst.  HPI  Review of Systems  Constitutional: Negative for fatigue and fever.  HENT: Negative for ear pain.   Eyes: Negative for pain.  Respiratory: Negative for cough and shortness of breath.   Cardiovascular: Negative for leg swelling.  Gastrointestinal: Negative for blood in stool, constipation and diarrhea.  Genitourinary: Negative  for difficulty urinating.  Musculoskeletal: Negative for back pain and neck pain.  Skin: Negative for rash.  Allergic/Immunologic: Negative for food allergies.  Neurological: Negative for dizziness, light-headedness and headaches.  Hematological: Does not bruise/bleed easily.  Psychiatric/Behavioral: Negative for sleep disturbance.     Objective: Vital Signs: BP (!) 147/77 (BP Location: Left Arm, Patient Position: Sitting, Cuff Size: Normal)   Pulse 63   Resp 18   Ht 5' 6" (1.676 m)   Wt 142 lb (64.4 kg)   LMP 11/07/1996   BMI 22.92 kg/m   Physical Exam  Ortho Exam localized area of tenderness and very minimal swelling over the end of the right ulna.  No particular pain with radial ulnar deviation.  No masses.  Good pulses.  Good capillary refill.  No.  No pain over the TFCC  Specialty Comments:  No specialty comments available.  Imaging: No results found.   PMFS History: Patient Active Problem List   Diagnosis Date Noted  . Primary osteoarthritis of both knees 11/30/2017  . Primary osteoarthritis of both feet 11/30/2017  . History of rotator cuff tear repair, bilateral 11/30/2017  . Vitamin D deficiency 12/27/2015  . Smoldering multiple myeloma ([place]) 12/22/2015  . Hypergammaglobulinemia   . IgG monoclonal gammopathy of uncertain significance   . Abnormal gamma globulin level  04/14/2015  . Hyperlipidemia 04/03/2015  . Hyperglycemia 04/03/2015  . Diverticulosis of colon without hemorrhage 04/03/2015  . Arthralgia of multiple joints 04/03/2015  . Primary osteoarthritis of both hands 01/20/2010  . SKIN CANCER, HX OF 01/20/2010   Past Medical History:  Diagnosis Date  . Atrial fibrillation ([place]) 12/2010   PMH of ; [place] , [place] [facility]  . Blood transfusion during current hospitalization 6962   complication after childbirth  . Cancer ([place]) 2000   SKIN CANCER IN [place]AL[place]  . DJD (degenerative joint disease)   . Skin cancer of face 2015   Patient reports this was likely  basal cell on the right side of her face needing 14 stitches.  . Smoldering multiple myeloma ([place]) 2016    Family History  Problem Relation Age of Onset  . Atrial fibrillation Father   . Asthma Brother   . Alzheimer's disease Mother   . Myasthenia gravis Mother        ocular  . Breast cancer Paternal Grandmother   . Depression Daughter   . Colon cancer Neg Hx   . Rectal cancer Neg Hx   . Stomach cancer Neg Hx   . Diabetes Neg Hx   . Stroke Neg Hx   . Heart attack Neg Hx     Past Surgical History:  Procedure Laterality Date  . CHOLECYSTECTOMY  1990's  . COLONOSCOPY  02/2013   negative X 3; Dr Olevia Perches  . G 3 P 1    . SHOULDER SURGERY Right 2012   R ; Dr Durward Fortes for rotator tear  . SHOULDER SURGERY Left 06/2014   Dr. Sydnee Cabal for rotator cuff  . T&A  child  . WISDOM TOOTH EXTRACTION  teenager   Social History   Occupational History  . Not on file  Tobacco Use  . Smoking status: Former Smoker    Packs/day: 0.25    Years: 3.00    Pack years: 0.75    Last attempt to quit: 11/07/1970    Years since quitting: 47.2  . Smokeless tobacco: Never Used  . Tobacco comment: 1/4 ppd [id number]  Substance and Sexual Activity  . Alcohol use: Yes    Alcohol/week: 6.0 oz    Types: 10 Glasses of wine per week  . Drug use: No  . Sexual activity: Never    Partners: Male    Birth control/protection: Abstinence

## 2018-02-09 ENCOUNTER — Telehealth: Payer: Self-pay

## 2018-02-09 NOTE — Telephone Encounter (Signed)
Received MyChart message from patient:  "I have a question about COMPREHENSIVE METABOLIC PANEL resulted on 02/01/18, 12:38 PM.  Protein: 9.3.  Is that concerning?"  Response to patient:  Hi Jennifer [name],  This is Aldona Bar, Dr. Grier Mitts nurse. Per Dr. Irene Limbo, your M spike is slightly more elevated than last time. We will see you at your appointment on 4/11, or if you would like to come in on 4/10 at 1pm we can change your appointment. Dr. Irene Limbo will discuss the finding with you further at that time.   Sincerely,  Samantha, RN  Plan to see pt next week and potentially set up [place] and PET scan to determine if pt is a candidate for therapy.

## 2018-02-12 ENCOUNTER — Telehealth: Payer: Self-pay

## 2018-02-12 NOTE — Telephone Encounter (Signed)
Per Dr. Irene Limbo, offered for pt to come in sooner on 4/10. Confirmed appt change via MyChart for 02/14/18 at 1pm.

## 2018-02-12 NOTE — Progress Notes (Signed)
HEMATOLOGY/ONCOLOGY CLINIC NOTE  Date of Service: 02/14/18    Patient Care Team: [name], MD as PCP - General (Family Medicine)  CHIEF COMPLAINTS/PURPOSE OF CONSULTATION:   Follow up for smoldering multiple myeloma  Diagnosis: Smoldering multiple myeloma  Treatment: Close monitoring  HISTORY OF PRESENTING ILLNESS: please see my initial consultation for details of her initial presentation  INTERVAL HISTORY  Jennifer [name] is here for her scheduled interval follow-up of her smoldering multiple myeloma. The patient's last visit with [name] was on 10/26/17. She is accompanied today by her husband. The pt reports that she is doing well overall and has been enjoying playing pickle ball.   The pt reports that she has had a recent hammer toe surgery and her only new complaint is some remaining joint pain.   Of note since the patient's last visit, pt has had Bone Survey completed on 02/01/18 with results revealing 1. No definite radiolucent is seen. The only questionable abnormality is a small subtle radiolucent area within the proximal right humeral neck, not definitely seen previously. 2. No active lung disease. 3. Degenerative changes in the cervical and lumbar spine as noted above.  Lab results (02/01/18) of CBC, CMP, and Reticulocytes is as follows: all values are WNL except for Total Protein at 9.3. Kappa/Lambda 02/01/18 labs show all values WNL.  MMP 02/01/18 shows all values WNL except for IgG at 4173, IgA at 15, IgM <5, Total Protein ELP at 8.9, Gamma Glob at 3.2, M Protein at 3.0, Globulin Total at 5.1.   On review of systems, pt reports toe joint pain, and good energy levels, and denies any recent infections, fatigue, back pains, pain along the spine, any other painful or swollen joints, leg swelling, and any other symptoms.   MEDICAL HISTORY:  Past Medical History:  Diagnosis Date  . Atrial fibrillation ([place]) 12/2010   PMH of ; [place] , [place] [facility]  . Blood transfusion during  current hospitalization 4801   complication after childbirth  . Cancer ([place]) 2000   SKIN CANCER IN [place]  . DJD (degenerative joint disease)   . Skin cancer of face 2015   Patient reports this was likely basal cell on the right side of her face needing 14 stitches.  . Smoldering multiple myeloma ([place]) 2016   . Patient Active Problem List   Diagnosis Date Noted  . Primary osteoarthritis of both knees 11/30/2017  . Primary osteoarthritis of both feet 11/30/2017  . History of rotator cuff tear repair, bilateral 11/30/2017  . Vitamin D deficiency 12/27/2015  . Smoldering multiple myeloma ([place]) 12/22/2015  . Hypergammaglobulinemia   . IgG monoclonal gammopathy of uncertain significance   . Abnormal gamma globulin level 04/14/2015  . Hyperlipidemia 04/03/2015  . Hyperglycemia 04/03/2015  . Diverticulosis of colon without hemorrhage 04/03/2015  . Arthralgia of multiple joints 04/03/2015  . Primary osteoarthritis of both hands 01/20/2010  . SKIN CANCER, HX OF 01/20/2010    SURGICAL HISTORY: Past Surgical History:  Procedure Laterality Date  . CHOLECYSTECTOMY  1990's  . COLONOSCOPY  02/2013   negative X 3; Dr Olevia Perches  . G 3 P 1    . SHOULDER SURGERY Right 2012   R ; Dr Durward Fortes for rotator tear  . SHOULDER SURGERY Left 06/2014   Dr. Sydnee Cabal for rotator cuff  . T&A  child  . WISDOM TOOTH EXTRACTION  teenager    SOCIAL HISTORY: Social History   Socioeconomic History  . Marital status: Married    Spouse  name: Not on file  . Number of children: Not on file  . Years of education: Not on file  . Highest education level: Not on file  Occupational History  . Not on file  Social Needs  . Financial resource strain: Not on file  . Food insecurity:    Worry: Not on file    Inability: Not on file  . Transportation needs:    Medical: Not on file    Non-medical: Not on file  Tobacco Use  . Smoking status: Former Smoker    Packs/day: 0.25    Years: 3.00    Pack years: 0.75     Last attempt to quit: 11/07/1970    Years since quitting: 47.3  . Smokeless tobacco: Never Used  . Tobacco comment: 1/4 ppd [id number]  Substance and Sexual Activity  . Alcohol use: Yes    Alcohol/week: 6.0 oz    Types: 10 Glasses of wine per week  . Drug use: No  . Sexual activity: Never    Partners: Male    Birth control/protection: Abstinence  Lifestyle  . Physical activity:    Days per week: Not on file    Minutes per session: Not on file  . Stress: Not on file  Relationships  . Social connections:    Talks on phone: Not on file    Gets together: Not on file    Attends religious service: Not on file    Active member of club or organization: Not on file    Attends meetings of clubs or organizations: Not on file    Relationship status: Not on file  . Intimate partner violence:    Fear of current or ex partner: Not on file    Emotionally abused: Not on file    Physically abused: Not on file    Forced sexual activity: Not on file  Other Topics Concern  . Not on file  Social History Narrative  . Not on file    FAMILY HISTORY: Family History  Problem Relation Age of Onset  . Atrial fibrillation Father   . Asthma Brother   . Alzheimer's disease Mother   . Myasthenia gravis Mother        ocular  . Breast cancer Paternal Grandmother   . Depression Daughter   . Colon cancer Neg Hx   . Rectal cancer Neg Hx   . Stomach cancer Neg Hx   . Diabetes Neg Hx   . Stroke Neg Hx   . Heart attack Neg Hx     ALLERGIES:  has No Known Allergies.  MEDICATIONS:  Current Outpatient Medications  Medication Sig Dispense Refill  . Ginger, Zingiber officinalis, (GINGER ROOT PO) Take by mouth daily.    Marland Kitchen ibuprofen (ADVIL,MOTRIN) 200 MG tablet Take 200 mg by mouth 2 (two) times daily.    . Misc Natural Products (TART CHERRY ADVANCED PO) Take by mouth daily.    . Turmeric (CURCUMIN 95 PO) Take by mouth daily.    . TURMERIC PO Take by mouth daily.     No current facility-administered  medications for this visit.     REVIEW OF SYSTEMS:   .10 Point review of Systems was done is negative except as noted above.   PHYSICAL EXAMINATION: ECOG PERFORMANCE STATUS: 0-1 .BP (!) 153/79 (BP Location: Left Arm, Patient Position: Sitting)   Pulse 60   Temp 97.8 F (36.6 C) (Oral)   Resp 18   Ht _0  (1.676 m)   Wt 142 lb  4.8 oz (64.5 kg)   LMP 11/07/1996   SpO2 100%   BMI 22.97 kg/m   Filed Weights   02/14/18 1318  Weight: 142 lb 4.8 oz (64.5 kg)   .Body mass index is 22.97 kg/m. Marland Kitchen GENERAL:alert, in no acute distress and comfortable SKIN: no acute rashes, no significant lesions EYES: conjunctiva are pink and non-injected, sclera anicteric OROPHARYNX: MMM, no exudates, no oropharyngeal erythema or ulceration NECK: supple, no JVD LYMPH:  no palpable lymphadenopathy in the cervical, axillary or inguinal regions LUNGS: clear to auscultation b/l with normal respiratory effort HEART: regular rate & rhythm ABDOMEN:  normoactive bowel sounds , non tender, not distended. Extremity: no pedal edema PSYCH: alert & oriented x 3 with fluent speech NEURO: no focal motor/sensory deficits   LABORATORY DATA:  I have reviewed the data as listed  . CBC Latest Ref Rng & Units 03/01/2018 02/01/2018 10/26/2017  WBC 4.0 - 10.5 K/uL 5.2 5.6 4.7  Hemoglobin 12.0 - 15.0 g/dL 12.8 12.6 12.4  Hematocrit 36.0 - 46.0 % 37.3 35.9 36.2  Platelets 150 - 400 K/uL 255 293 235  HGB 12.8   . CMP Latest Ref Rng & Units 02/01/2018 10/26/2017 10/26/2017  Glucose 70 - 140 mg/dL 100 91 -  BUN 7 - 26 mg/dL 23 17.9 -  Creatinine 0.60 - 1.10 mg/dL 0.96 0.9 -  Sodium 136 - 145 mmol/L 137 138 -  Potassium 3.5 - 5.1 mmol/L 4.6 5.1 -  Chloride 98 - 109 mmol/L 103 - -  CO2 22 - 29 mmol/L 26 24 -  Calcium 8.4 - 10.4 mg/dL 9.7 9.2 -  Total Protein 6.4 - 8.3 g/dL 9.3(H) 8.8(H) 8.1  Total Bilirubin 0.2 - 1.2 mg/dL 0.4 0.48 -  Alkaline Phos 40 - 150 U/L 59 59 -  AST 5 - 34 U/L 16 17 -  ALT 0 - 55 U/L  18 18 -         RADIOGRAPHIC STUDIES: I have personally reviewed the radiological images as listed and agreed with the findings in the report. Dg Bone Survey Met  Result Date: 02/01/2018 CLINICAL DATA:  Staging of multiple myeloma EXAM: METASTATIC BONE SURVEY COMPARISON:  Metastatic bone survey of 12/09/2016 FINDINGS: The lungs are clear and well aerated. A small nodule at the left lung base is stable and may represent a faintly calcified granuloma. Mediastinal and hilar contours are unremarkable and heart size is unchanged. No bony abnormality is seen. A lateral view the skull shows no radiolucent lesion. The cervical vertebrae are unchanged in alignment with slight retrolisthesis of C5 on C6 which appears degenerative in origin. Degenerative disc disease is again noted at C4-5, C5-6, and C6-7 levels with loss of disc space and sclerosis with spurring. No radiolucent lesion is seen. Thoracic vertebrae are in normal alignment with normal intervertebral disc spaces. No compression deformity is seen. Alignment of the lumbar vertebrae is unchanged. There is very mild anterolisthesis of L4 on L5 which appears to be due to degenerative change of the facet joints. Mild degenerative disc disease is noted at L1-2, L2-3, and L4-5 levels. No compression deformity is seen. Views of both shoulders show no radiolucent lesion. Views of the humerus bilaterally show no definite lesion. On the right there is a faint radiolucent area within the proximal right humerus not definitely seen previously. Views of both forearms show no radiolucent lesion. A view of the pelvis shows no definite radiolucent lesion. Views of both femurs, tibia and fibula show no abnormality. IMPRESSION: 1.  No definite radiolucent is seen. The only questionable abnormality is a small subtle radiolucent area within the proximal right humeral neck, not definitely seen previously. 2. No active lung disease. 3. Degenerative changes in the cervical and  lumbar spine as noted above. Electronically Signed   By: Ivar Drape M.D.   On: 02/01/2018 15:05   Normal DEXA scan in June 2015     Cytogenetic analysis: Revealed the presence of a normal female chromosomes with no observable clonal chromosomal abnormalities.   ASSESSMENT & PLAN:   72 year old Caucasian female in good overall health with  #1Smoldering Multiple Myeloma IgG lambda. Bone marrow plasma cells more than 10% ( noted to have 35% clonal plasma cells] Normal female karyotype cytogenetics with no other abnormalities SPEP shows M spike of 2.2 (slightly decrease/stabiliyt from previous levels) with IFE showing IgG lambda paraprotein. Associated with some Immunoparesis characterized by associated decrease in IgA and IgM.  M protein 2.6--> 2.6---> 2.2--->2.4--->2.3--> 2.6-->2.4->2.5--> 2.2--->3   SFLC K/L ratio low nl. Myeloma panel shows significant bump in M spike to 3 from 2.2 Skeletal survey shows no overt evidence of myelomatous bone lesinos but some concern of indeterminate lesions in the rt humeral neck.  Patient on follow-up today is feeling good and has no new focal symptoms or fatigue. No issues with infections. No renal insuff/hypercalcemia or new anemia. Plan -Discussed pt labwork from 02/01/18; no changes in her blood counts and chemistries except for her total protein which has increased to 9.3.  -Discussed that her M Protein level has increased from 2.2 to 3.0. -Reviewed 02/01/18 Bone survey which revealed The only questionable abnormality is a small subtle radiolucent area within the proximal right humeral neck, not definitely seen previously. -No anemia, no increased calcium levels, no kidney dysfunction, no new bone tumors.  Kappa:Lambda ratio is at 0.48 -Discussed that the other criteria for treatment would be a 60% plasma cell concentration in her BM. -Would like to repeat a BM Bx and a PET/CT in 4 weeks to evaluate for progression to myeloma to consider possible  change in treatment strategy -Will see pt back in 5 weeks  -Would recommend yearly flu shots    PET/CT in 4 weeks CT bone marrow Biopsy in 4 weeks (Patient prefers to have PET and BM Bx on the same day if possible) RTC with Dr Irene Limbo in 6weeks with labs   All off the patients questions were answered to her apparent satisfaction. The patient knows to call the clinic with any problems, questions or concerns.  . The total time spent in the appointment was 25 minutes and more than 50% was on counseling and direct patient cares.      Sullivan Lone MD [place] AAHIVMS [facility] Hematology/Oncology Physician [place]  (Office):       [phone] (Work cell):  [phone] (Fax):           [phone]   This document serves as a record of services personally performed by Sullivan Lone, MD. It was created on his behalf by Baldwin Jamaica, a trained medical scribe. The creation of this record is based on the scribe's personal observations and the provider's statements to them.   .I have reviewed the above documentation for accuracy and completeness, and I agree with the above. Brunetta Genera MD [place]

## 2018-02-14 ENCOUNTER — Inpatient Hospital Stay: Payer: Medicare Other | Attending: Hematology | Admitting: Hematology

## 2018-02-14 ENCOUNTER — Telehealth: Payer: Self-pay | Admitting: Hematology

## 2018-02-14 ENCOUNTER — Encounter: Payer: Self-pay | Admitting: Hematology

## 2018-02-14 VITALS — BP 153/79 | HR 60 | Temp 97.8°F | Resp 18 | Ht 66.0 in | Wt 142.3 lb

## 2018-02-14 DIAGNOSIS — C9000 Multiple myeloma not having achieved remission: Secondary | ICD-10-CM | POA: Diagnosis not present

## 2018-02-14 DIAGNOSIS — D472 Monoclonal gammopathy: Secondary | ICD-10-CM

## 2018-02-14 NOTE — Telephone Encounter (Signed)
Scheduled appt per 4/10 los - Gave patient AVS and calender per los.  

## 2018-02-15 ENCOUNTER — Ambulatory Visit: Payer: Medicare Other | Admitting: Hematology

## 2018-02-20 ENCOUNTER — Encounter: Payer: Self-pay | Admitting: Hematology

## 2018-02-28 ENCOUNTER — Other Ambulatory Visit: Payer: Self-pay | Admitting: Physician Assistant

## 2018-03-01 ENCOUNTER — Ambulatory Visit (HOSPITAL_COMMUNITY)
Admission: RE | Admit: 2018-03-01 | Discharge: 2018-03-01 | Disposition: A | Discharge status: Home / Self Care | Payer: Medicare Other | Source: Ambulatory Visit | Attending: Hematology | Admitting: Hematology

## 2018-03-01 ENCOUNTER — Encounter (HOSPITAL_COMMUNITY): Payer: Self-pay

## 2018-03-01 DIAGNOSIS — Z85828 Personal history of other malignant neoplasm of skin: Secondary | ICD-10-CM | POA: Diagnosis not present

## 2018-03-01 DIAGNOSIS — C9000 Multiple myeloma not having achieved remission: Secondary | ICD-10-CM | POA: Diagnosis not present

## 2018-03-01 DIAGNOSIS — D4989 Neoplasm of unspecified behavior of other specified sites: Secondary | ICD-10-CM | POA: Diagnosis not present

## 2018-03-01 DIAGNOSIS — I4891 Unspecified atrial fibrillation: Secondary | ICD-10-CM | POA: Insufficient documentation

## 2018-03-01 DIAGNOSIS — Z9889 Other specified postprocedural states: Secondary | ICD-10-CM | POA: Diagnosis not present

## 2018-03-01 DIAGNOSIS — Z1379 Encounter for other screening for genetic and chromosomal anomalies: Secondary | ICD-10-CM | POA: Diagnosis not present

## 2018-03-01 DIAGNOSIS — Z9049 Acquired absence of other specified parts of digestive tract: Secondary | ICD-10-CM | POA: Diagnosis not present

## 2018-03-01 DIAGNOSIS — C9030 Solitary plasmacytoma not having achieved remission: Secondary | ICD-10-CM | POA: Diagnosis not present

## 2018-03-01 DIAGNOSIS — D7589 Other specified diseases of blood and blood-forming organs: Secondary | ICD-10-CM | POA: Diagnosis not present

## 2018-03-01 DIAGNOSIS — M1990 Unspecified osteoarthritis, unspecified site: Secondary | ICD-10-CM | POA: Insufficient documentation

## 2018-03-01 DIAGNOSIS — D472 Monoclonal gammopathy: Secondary | ICD-10-CM

## 2018-03-01 LAB — CBC
HCT: 37.3 % (ref 36.0–46.0)
Hemoglobin: 12.8 g/dL (ref 12.0–15.0)
MCH: 32.4 pg (ref 26.0–34.0)
MCHC: 34.3 g/dL (ref 30.0–36.0)
MCV: 94.4 fL (ref 78.0–100.0)
Platelets: 255 10*3/uL (ref 150–400)
RBC: 3.95 MIL/uL (ref 3.87–5.11)
RDW: 13.1 % (ref 11.5–15.5)
WBC: 5.2 10*3/uL (ref 4.0–10.5)

## 2018-03-01 LAB — PROTIME-INR
INR: 0.98
Prothrombin Time: 12.8 seconds (ref 11.4–15.2)

## 2018-03-01 LAB — APTT: aPTT: 28 seconds (ref 24–36)

## 2018-03-01 MED ORDER — NALOXONE HCL 0.4 MG/ML IJ SOLN
INTRAMUSCULAR | Status: AC
  Filled 2018-03-01: qty 1

## 2018-03-01 MED ORDER — MIDAZOLAM HCL 2 MG/2ML IJ SOLN
INTRAMUSCULAR | Status: AC
  Filled 2018-03-01: qty 4

## 2018-03-01 MED ORDER — FENTANYL CITRATE (PF) 100 MCG/2ML IJ SOLN
INTRAMUSCULAR | Status: AC | PRN
  Administered 2018-03-01 (×2): 50 ug via INTRAVENOUS

## 2018-03-01 MED ORDER — MIDAZOLAM HCL 2 MG/2ML IJ SOLN
INTRAMUSCULAR | Status: AC | PRN
  Administered 2018-03-01 (×2): 1 mg via INTRAVENOUS

## 2018-03-01 MED ORDER — FLUMAZENIL 0.5 MG/5ML IV SOLN
INTRAVENOUS | Status: AC
  Filled 2018-03-01: qty 5

## 2018-03-01 MED ORDER — SODIUM CHLORIDE 0.9 % IV SOLN
INTRAVENOUS | Status: DC
  Administered 2018-03-01: 10:00:00 via INTRAVENOUS

## 2018-03-01 MED ORDER — FENTANYL CITRATE (PF) 100 MCG/2ML IJ SOLN
INTRAMUSCULAR | Status: AC
  Filled 2018-03-01: qty 4

## 2018-03-01 NOTE — Procedures (Signed)
Interventional Radiology Procedure Note  Procedure: CT guided aspirate and core biopsy of right iliac bone Complications: None Recommendations: - Bedrest supine x 1 hrs - Hydrocodone PRN  Pain - Follow biopsy results  Signed,  Paxtyn Wisdom K. Talonda Artist, MD   

## 2018-03-01 NOTE — Discharge Instructions (Signed)
Moderate Conscious Sedation, Adult, Care After °These instructions provide you with information about caring for yourself after your procedure. Your health care provider may also give you more specific instructions. Your treatment has been planned according to current medical practices, but problems sometimes occur. Call your health care provider if you have any problems or questions after your procedure. °What can I expect after the procedure? °After your procedure, it is common: °· To feel sleepy for several hours. °· To feel clumsy and have poor balance for several hours. °· To have poor judgment for several hours. °· To vomit if you eat too soon. ° °Follow these instructions at home: °For at least 24 hours after the procedure: ° °· Do not: °? Participate in activities where you could fall or become injured. °? Drive. °? Use heavy machinery. °? Drink alcohol. °? Take sleeping pills or medicines that cause drowsiness. °? Make important decisions or sign legal documents. °? Take care of children on your own. °· Rest. °Eating and drinking °· Follow the diet recommended by your health care provider. °· If you vomit: °? Drink water, juice, or soup when you can drink without vomiting. °? Make sure you have little or no nausea before eating solid foods. °General instructions °· Have a responsible adult stay with you until you are awake and alert. °· Take over-the-counter and prescription medicines only as told by your health care provider. °· If you smoke, do not smoke without supervision. °· Keep all follow-up visits as told by your health care provider. This is important. °Contact a health care provider if: °· You keep feeling nauseous or you keep vomiting. °· You feel light-headed. °· You develop a rash. °· You have a fever. °Get help right away if: °· You have trouble breathing. °This information is not intended to replace advice given to you by your health care provider. Make sure you discuss any questions you have  with your health care provider. °Document Released: 08/14/2013 Document Revised: 03/28/2016 Document Reviewed: 02/13/2016 °Elsevier Interactive Patient Education © 2018 [employer] ° ° °Bone Marrow Aspiration and Bone Marrow Biopsy, Adult, Care After °This sheet gives you information about how to care for yourself after your procedure. Your health care provider may also give you more specific instructions. If you have problems or questions, contact your health care provider. °What can I expect after the procedure? °After the procedure, it is common to have: °· Mild pain and tenderness. °· Swelling. °· Bruising. ° °Follow these instructions at home: °· Take over-the-counter or prescription medicines only as told by your health care provider. °· Do not take baths, swim, or use a hot tub until your health care provider approves. Ask if you can take a shower or have a sponge bath.  You may shower tomorrow. °· Follow instructions from your health care provider about how to take care of the puncture site. Make sure you: °? Wash your hands with soap and water before you change your bandage (dressing). If soap and water are not available, use hand sanitizer. °? Change your dressing as told by your health care provider.  You may remove your dressing tomorrow. °· Check your puncture site every day for signs of infection. Check for: °? More redness, swelling, or pain. °? More fluid or blood. °? Warmth. °? Pus or a bad smell. °· Return to your normal activities as told by your health care provider. Ask your health care provider what activities are safe for you. °· Do not drive   for 24 hours if you were given a medicine to help you relax (sedative). °· Keep all follow-up visits as told by your health care provider. This is important. °Contact a health care provider if: °· You have more redness, swelling, or pain around the puncture site. °· You have more fluid or blood coming from the puncture site. °· Your puncture site feels  warm to the touch. °· You have pus or a bad smell coming from the puncture site. °· You have a fever. °· Your pain is not controlled with medicine. °This information is not intended to replace advice given to you by your health care provider. Make sure you discuss any questions you have with your health care provider. °Document Released: 05/13/2005 Document Revised: 05/13/2016 Document Reviewed: 04/06/2016 °Elsevier Interactive Patient Education © 2018 Elsevier Inc. ° °

## 2018-03-01 NOTE — H&P (Signed)
Referring Physician(s): Brunetta Genera  Supervising Physician: Jacqulynn Cadet  Patient Status:  [facility] OP  Chief Complaint:  "I'm having a bone marrow biopsy"  Subjective: Patient familiar to IR service from prior bone marrow biopsy in 2016.  She has a history of smoldering multiple myeloma and now with increasing M spike.  She presents again today for repeat CT-guided bone marrow biopsy for further evaluation.  She denies fever, headache, chest pain, dyspnea, cough, abdominal/back pain, nausea, vomiting or bleeding.  Past Medical History:  Diagnosis Date  . Atrial fibrillation ([place]) 12/2010   PMH of ; [place] , [place] [facility]  . Blood transfusion during current hospitalization 9937   complication after childbirth  . Cancer ([place]) 2000   SKIN CANCER IN [place]  . DJD (degenerative joint disease)   . Skin cancer of face 2015   Patient reports this was likely basal cell on the right side of her face needing 14 stitches.  . Smoldering multiple myeloma ([place]) 2016   Past Surgical History:  Procedure Laterality Date  . CHOLECYSTECTOMY  1990's  . COLONOSCOPY  02/2013   negative X 3; Dr Olevia Perches  . G 3 P 1    . SHOULDER SURGERY Right 2012   R ; Dr Durward Fortes for rotator tear  . SHOULDER SURGERY Left 06/2014   Dr. Sydnee Cabal for rotator cuff  . T&A  child  . WISDOM TOOTH EXTRACTION  teenager     Allergies: Patient has no known allergies.  Medications: Prior to Admission medications   Medication Sig Start Date End Date Taking? Authorizing Provider  Ginger, Zingiber officinalis, (GINGER ROOT PO) Take by mouth daily.    [provider]  ibuprofen (ADVIL,MOTRIN) 200 MG tablet Take 200 mg by mouth 2 (two) times daily.    [provider]  Misc Natural Products (TART CHERRY ADVANCED PO) Take by mouth daily.    [provider]  Turmeric (CURCUMIN 95 PO) Take by mouth daily.    [provider]  TURMERIC PO Take by mouth daily.    [provider]       Vital Signs: BP (!) 147/78   Pulse 83   Temp 98.1 F (36.7 C) (Oral)   Resp 18   Ht '5\' 6"'$  (1.676 m)   Wt 142 lb (64.4 kg)   LMP 11/07/1996   SpO2 100%   BMI 22.92 kg/m   Physical Exam awake, alert.  Chest clear to auscultation bilaterally.  Heart with regular rate and rhythm.  Abdomen soft, positive bowel sounds, nontender.  No lower extremity edema.  Imaging: No results found.  Labs:  CBC: Recent Labs    03/06/17 0929 06/26/17 1139 10/26/17 1002 02/01/18 1151  WBC 4.9 6.9 4.7 5.6  HGB 12.7 13.1 12.4 12.6  HCT 37.0 38.7 36.2 35.9  PLT 261 238 235 293    COAGS: No results for input(s): INR, APTT in the last 8760 hours.  BMP: Recent Labs    03/06/17 0929 06/26/17 1139 10/26/17 1002 02/01/18 1151  NA 139 138 138 137  K 4.4 4.4 5.1 4.6  CL  --   --   --  103  CO2 '23 27 24 26  '$ GLUCOSE 96 92 91 100  BUN 22.5 18.1 17.9 23  CALCIUM 9.3 9.5 9.2 9.7  CREATININE 0.9 0.9 0.9 0.96  GFRNONAA  --   --   --  57*  GFRAA  --   --   --  >60    LIVER FUNCTION  TESTS: Recent Labs    03/06/17 0929 06/26/17 1139 10/26/17 1002 02/01/18 1151  BILITOT 0.67 0.71 0.48 0.4  AST '20 21 17 16  '$ ALT '19 19 18 18  '$ ALKPHOS 55 59 59 59  PROT 9.0*  8.3 9.0*  8.3 8.8*  8.1 9.3*  ALBUMIN 3.9 3.7 3.7 3.5    Assessment and Plan: Pt with history of smoldering multiple myeloma, currently under observation; now with increasing M spike.  She presents  today for CT-guided bone marrow biopsy for further evaluation.  Risks and benefits discussed with the patient including, but not limited to bleeding, infection, damage to adjacent structures or low yield requiring additional tests.  All of the patient's questions were answered, patient is agreeable to proceed. Consent signed and in chart.     Electronically Signed: D. Rowe Robert, PA-C 03/01/2018, 9:31 AM   I spent a total of 20 minutes  at the the patient's bedside AND on the patient's hospital floor or unit, greater than  50% of which was counseling/coordinating care for CT-guided bone marrow biopsy

## 2018-03-02 ENCOUNTER — Other Ambulatory Visit: Payer: Self-pay | Admitting: Hematology

## 2018-03-02 ENCOUNTER — Ambulatory Visit (HOSPITAL_COMMUNITY)
Admission: RE | Admit: 2018-03-02 | Discharge: 2018-03-02 | Disposition: A | Discharge status: Home / Self Care | Payer: Medicare Other | Source: Ambulatory Visit | Attending: Hematology | Admitting: Hematology

## 2018-03-02 DIAGNOSIS — C9000 Multiple myeloma not having achieved remission: Secondary | ICD-10-CM | POA: Insufficient documentation

## 2018-03-02 DIAGNOSIS — D472 Monoclonal gammopathy: Secondary | ICD-10-CM

## 2018-03-02 DIAGNOSIS — I700 Atherosclerosis of aorta: Secondary | ICD-10-CM | POA: Insufficient documentation

## 2018-03-02 LAB — GLUCOSE, CAPILLARY: Glucose-Capillary: 104 mg/dL — ABNORMAL HIGH (ref 65–99)

## 2018-03-02 MED ORDER — FLUDEOXYGLUCOSE F - 18 (FDG) INJECTION: 6.9000 | Freq: Once | INTRAVENOUS | Status: DC | PRN

## 2018-03-05 DIAGNOSIS — Z4789 Encounter for other orthopedic aftercare: Secondary | ICD-10-CM | POA: Diagnosis not present

## 2018-03-05 DIAGNOSIS — Z981 Arthrodesis status: Secondary | ICD-10-CM | POA: Diagnosis not present

## 2018-03-13 ENCOUNTER — Encounter (HOSPITAL_COMMUNITY): Payer: Self-pay | Admitting: Hematology

## 2018-03-15 LAB — CHROMOSOME ANALYSIS, BONE MARROW

## 2018-03-15 LAB — TISSUE HYBRIDIZATION (BONE MARROW)-NCBH

## 2018-03-26 NOTE — Progress Notes (Signed)
HEMATOLOGY/ONCOLOGY CLINIC NOTE  Date of Service: 03/28/18    Patient Care Team: [name], MD as PCP - General (Family Medicine)  CHIEF COMPLAINTS/PURPOSE OF CONSULTATION:   Follow up for smoldering multiple myeloma  Diagnosis: Smoldering multiple myeloma  Treatment: Close monitoring  HISTORY OF PRESENTING ILLNESS: please see my initial consultation for details of her initial presentation  INTERVAL HISTORY  Jennifer [name] is here for her scheduled interval follow-up of her smoldering multiple myeloma. The patient's last visit with [name] was on 02/14/18. She is accompanied today by her husband. The pt reports that she is doing well overall.   The pt reports that her joints have been a little more sore recently but notes being exceptionally active and attributes her soreness to this. She notes that she consumes tumeric often to help with any inflammation. She also notes some left neck soreness related to golf that is muscular and denies any new bone pains.   Of note since the patient's last visit, pt has had PET/CT completed on 03/02/18 with results revealing 1. No hypermetabolic osseous lesion on today's study. No hypermetabolic soft tissue disease in the neck, chest, abdomen, pelvis, or lower extremities. 2.  Aortic Atherosclerois.  On 03/05/18 the pt had a BM Bx which revealed: Bone Marrow, Aspirate,Biopsy, and Clot, right iliac - SLIGHTLY HYPERCELLULAR BONE MARROW FOR AGE WITH PLASMA CELL NEOPLASM. PERIPHERAL BLOOD: - RELATIVE ABUNDANCE OF LARGE GRANULAR LYMPHOCYTES  Lab results today (03/28/18) of CBC, CMP is as follows: all values are WNL except for Total Protein at 9.4. MMP show M protein down from 3 to 2.7  and Kappa/Lambda 03/28/18 wnl  On review of systems, pt reports left-sided neck soreness, good energy levels and denies fatigue, fevers, chills, night sweats, bone pains, abdominal pains, and any other symptoms.    MEDICAL HISTORY:  Past Medical History:  Diagnosis  Date  . Atrial fibrillation ([place]) 12/2010   PMH of ; [place] , [place] [facility]  . Blood transfusion during current hospitalization 4196   complication after childbirth  . Cancer ([place]) 2000   SKIN CANCER IN [place]  . DJD (degenerative joint disease)   . Skin cancer of face 2015   Patient reports this was likely basal cell on the right side of her face needing 14 stitches.  . Smoldering multiple myeloma ([place]) 2016   . Patient Active Problem List   Diagnosis Date Noted  . Primary osteoarthritis of both knees 11/30/2017  . Primary osteoarthritis of both feet 11/30/2017  . History of rotator cuff tear repair, bilateral 11/30/2017  . Vitamin D deficiency 12/27/2015  . Smoldering multiple myeloma ([place]) 12/22/2015  . Hypergammaglobulinemia   . IgG monoclonal gammopathy of uncertain significance   . Abnormal gamma globulin level 04/14/2015  . Hyperlipidemia 04/03/2015  . Hyperglycemia 04/03/2015  . Diverticulosis of colon without hemorrhage 04/03/2015  . Arthralgia of multiple joints 04/03/2015  . Primary osteoarthritis of both hands 01/20/2010  . SKIN CANCER, HX OF 01/20/2010    SURGICAL HISTORY: Past Surgical History:  Procedure Laterality Date  . CHOLECYSTECTOMY  1990's  . COLONOSCOPY  02/2013   negative X 3; Dr Olevia Perches  . G 3 P 1    . SHOULDER SURGERY Right 2012   R ; Dr Durward Fortes for rotator tear  . SHOULDER SURGERY Left 06/2014   Dr. Sydnee Cabal for rotator cuff  . T&A  child  . WISDOM TOOTH EXTRACTION  teenager    SOCIAL HISTORY: Social History   Socioeconomic History  .  Marital status: Married    Spouse name: Not on file  . Number of children: Not on file  . Years of education: Not on file  . Highest education level: Not on file  Occupational History  . Not on file  Social Needs  . Financial resource strain: Not on file  . Food insecurity:    Worry: Not on file    Inability: Not on file  . Transportation needs:    Medical: Not on file    Non-medical: Not on file    Tobacco Use  . Smoking status: Former Smoker    Packs/day: 0.25    Years: 3.00    Pack years: 0.75    Last attempt to quit: 11/07/1970    Years since quitting: 47.4  . Smokeless tobacco: Never Used  . Tobacco comment: 1/4 ppd [id number]  Substance and Sexual Activity  . Alcohol use: Yes    Alcohol/week: 6.0 oz    Types: 10 Glasses of wine per week  . Drug use: No  . Sexual activity: Never    Partners: Male    Birth control/protection: Abstinence  Lifestyle  . Physical activity:    Days per week: Not on file    Minutes per session: Not on file  . Stress: Not on file  Relationships  . Social connections:    Talks on phone: Not on file    Gets together: Not on file    Attends religious service: Not on file    Active member of club or organization: Not on file    Attends meetings of clubs or organizations: Not on file    Relationship status: Not on file  . Intimate partner violence:    Fear of current or ex partner: Not on file    Emotionally abused: Not on file    Physically abused: Not on file    Forced sexual activity: Not on file  Other Topics Concern  . Not on file  Social History Narrative  . Not on file    FAMILY HISTORY: Family History  Problem Relation Age of Onset  . Atrial fibrillation Father   . Asthma Brother   . Alzheimer's disease Mother   . Myasthenia gravis Mother        ocular  . Breast cancer Paternal Grandmother   . Depression Daughter   . Colon cancer Neg Hx   . Rectal cancer Neg Hx   . Stomach cancer Neg Hx   . Diabetes Neg Hx   . Stroke Neg Hx   . Heart attack Neg Hx     ALLERGIES:  has No Known Allergies.  MEDICATIONS:  Current Outpatient Medications  Medication Sig Dispense Refill  . Ginger, Zingiber officinalis, (GINGER ROOT PO) Take by mouth daily.    Marland Kitchen ibuprofen (ADVIL,MOTRIN) 200 MG tablet Take 200 mg by mouth 2 (two) times daily.    . Misc Natural Products (TART CHERRY ADVANCED PO) Take by mouth daily.    . Turmeric (CURCUMIN  95 PO) Take by mouth daily.    . TURMERIC PO Take by mouth daily.     No current facility-administered medications for this visit.     REVIEW OF SYSTEMS:   A 10+ POINT REVIEW OF SYSTEMS WAS OBTAINED including neurology, dermatology, psychiatry, cardiac, respiratory, lymph, extremities, GI, GU, Musculoskeletal, constitutional, breasts, reproductive, HEENT.  All pertinent positives are noted in the HPI.  All others are negative.    PHYSICAL EXAMINATION: ECOG PERFORMANCE STATUS: 0-1 .BP (!) 143/74 (BP Location:  Left Arm, Patient Position: Sitting) Comment: Notified Nurse of BP  Pulse (!) 55   Resp 18   Ht '5\' 6"'$  (1.676 m)   Wt 144 lb 14.4 oz (65.7 kg)   LMP 11/07/1996   SpO2 100%   BMI 23.39 kg/m   Filed Weights   03/28/18 0905  Weight: 144 lb 14.4 oz (65.7 kg)   .Body mass index is 23.39 kg/m.  GENERAL:alert, in no acute distress and comfortable SKIN: no acute rashes, no significant lesions EYES: conjunctiva are pink and non-injected, sclera anicteric OROPHARYNX: MMM, no exudates, no oropharyngeal erythema or ulceration NECK: supple, no JVD LYMPH:  no palpable lymphadenopathy in the cervical, axillary or inguinal regions LUNGS: clear to auscultation b/l with normal respiratory effort HEART: regular rate & rhythm ABDOMEN:  normoactive bowel sounds , non tender, not distended. Extremity: no pedal edema PSYCH: alert & oriented x 3 with fluent speech NEURO: no focal motor/sensory deficits    LABORATORY DATA:  I have reviewed the data as listed  . CBC Latest Ref Rng & Units 03/28/2018 03/01/2018 02/01/2018  WBC 3.9 - 10.3 K/uL 5.1 5.2 5.6  Hemoglobin 11.6 - 15.9 g/dL 12.8 12.8 12.6  Hematocrit 34.8 - 46.6 % 37.4 37.3 35.9  Platelets 145 - 400 K/uL 261 255 293   . CBC    Component Value Date/Time   WBC 5.1 03/28/2018 0841   WBC 5.2 03/01/2018 0930   RBC 3.94 03/28/2018 0841   HGB 12.8 03/28/2018 0841   HGB 12.4 10/26/2017 1002   HCT 37.4 03/28/2018 0841   HCT 36.2  10/26/2017 1002   PLT 261 03/28/2018 0841   PLT 235 10/26/2017 1002   MCV 94.9 03/28/2018 0841   MCV 96.0 10/26/2017 1002   MCH 32.6 03/28/2018 0841   MCHC 34.4 03/28/2018 0841   RDW 13.6 03/28/2018 0841   RDW 12.7 10/26/2017 1002   LYMPHSABS 2.2 03/28/2018 0841   LYMPHSABS 2.4 10/26/2017 1002   MONOABS 0.3 03/28/2018 0841   MONOABS 0.2 10/26/2017 1002   EOSABS 0.0 03/28/2018 0841   EOSABS 0.1 10/26/2017 1002   BASOSABS 0.0 03/28/2018 0841   BASOSABS 0.0 10/26/2017 1002    . CMP Latest Ref Rng & Units 03/28/2018 02/01/2018 10/26/2017  Glucose 70 - 140 mg/dL 83 100 91  BUN 7 - 26 mg/dL 18 23 17.9  Creatinine 0.60 - 1.10 mg/dL 0.94 0.96 0.9  Sodium 136 - 145 mmol/L 139 137 138  Potassium 3.5 - 5.1 mmol/L 4.8 4.6 5.1  Chloride 98 - 109 mmol/L 106 103 -  CO2 22 - 29 mmol/L '25 26 24  '$ Calcium 8.4 - 10.4 mg/dL 9.3 9.7 9.2  Total Protein 6.4 - 8.3 g/dL 9.4(H) 9.3(H) 8.8(H)  Total Bilirubin 0.2 - 1.2 mg/dL 0.5 0.4 0.48  Alkaline Phos 40 - 150 U/L 65 59 59  AST 5 - 34 U/L '22 16 17  '$ ALT 0 - 55 U/L '21 18 18         '$ 03/01/18 Cytogenetics:   03/01/18 Bx:      RADIOGRAPHIC STUDIES: I have personally reviewed the radiological images as listed and agreed with the findings in the report. Nm Pet Image Restage (ps) Whole Body  Result Date: 03/02/2018 CLINICAL DATA:  Subsequent treatment strategy for multiple myeloma. EXAM: NUCLEAR MEDICINE PET WHOLE BODY TECHNIQUE: 6.9 mCi F-18 FDG was injected intravenously. Full-ring PET imaging was performed from the skull base to thigh after the radiotracer. CT data was obtained and used for attenuation correction and anatomic  localization. Fasting blood glucose: 104 mg/dl COMPARISON:  07/14/2015 FINDINGS: Mediastinal blood pool activity: SUV max 2.3 HEAD/NECK: No hypermetabolic activity in the scalp. No hypermetabolic cervical lymph nodes. Incidental CT findings: none CHEST: No hypermetabolic mediastinal or hilar nodes. No suspicious pulmonary  nodules on the CT scan. Incidental CT findings: Coronary artery calcification is evident. Atherosclerotic calcification is noted in the wall of the thoracic aorta. Calcified nodal tissue is seen in the subcarinal station in inferior left hilum. Posterior left lower lobe granuloma noted. ABDOMEN/PELVIS: No abnormal hypermetabolic activity within the liver, pancreas, adrenal glands, or spleen. No hypermetabolic lymph nodes in the abdomen or pelvis. Incidental CT findings: There is abdominal aortic atherosclerosis without aneurysm. SKELETON: No focal hypermetabolic activity to suggest skeletal metastasis. Incidental CT findings: none EXTREMITIES: No abnormal hypermetabolic activity in the lower extremities. Incidental CT findings: none IMPRESSION: 1. No hypermetabolic osseous lesion on today's study. No hypermetabolic soft tissue disease in the neck, chest, abdomen, pelvis, or lower extremities. 2.  Aortic Atherosclerois (ICD10-170.0) Electronically Signed   By: Misty Stanley M.D.   On: 03/02/2018 14:05   Ct Biopsy  Result Date: 03/01/2018 INDICATION: 72 year old female with smoldering multiple myeloma and concern for progressive disease. She presents for bone marrow biopsy. EXAM: CT GUIDED BONE MARROW ASPIRATION AND CORE BIOPSY Interventional Radiologist:  Criselda Peaches, MD MEDICATIONS: None. ANESTHESIA/SEDATION: Moderate (conscious) sedation was employed during this procedure. A total of 2 milligrams versed and 100 micrograms fentanyl were administered intravenously. The patient's level of consciousness and vital signs were monitored continuously by radiology nursing throughout the procedure under my direct supervision. Total monitored sedation time: 10 minutes FLUOROSCOPY TIME:  Fluoroscopy Time: 0 minutes 0 seconds (0 mGy). COMPLICATIONS: None immediate. Estimated blood loss: <25 mL PROCEDURE: Informed written consent was obtained from the patient after a thorough discussion of the procedural risks,  benefits and alternatives. All questions were addressed. Maximal Sterile Barrier Technique was utilized including caps, mask, sterile gowns, sterile gloves, sterile drape, hand hygiene and skin antiseptic. A timeout was performed prior to the initiation of the procedure. The patient was positioned prone and non-contrast localization CT was performed of the pelvis to demonstrate the iliac marrow spaces. Maximal barrier sterile technique utilized including caps, mask, sterile gowns, sterile gloves, large sterile drape, hand hygiene, and betadine prep. Under sterile conditions and local anesthesia, an 11 gauge coaxial bone biopsy needle was advanced into the right iliac marrow space. Needle position was confirmed with CT imaging. Initially, bone marrow aspiration was performed. Next, the 11 gauge outer cannula was utilized to obtain a right iliac bone marrow core biopsy. Needle was removed. Hemostasis was obtained with compression. The patient tolerated the procedure well. Samples were prepared with the cytotechnologist. IMPRESSION: Technically successful right-sided bone marrow aspiration and core biopsy. Electronically Signed   By: Jacqulynn Cadet M.D.   On: 03/01/2018 12:42   Ct Bone Marrow Biopsy & Aspiration  Result Date: 03/01/2018 INDICATION: 72 year old female with smoldering multiple myeloma and concern for progressive disease. She presents for bone marrow biopsy. EXAM: CT GUIDED BONE MARROW ASPIRATION AND CORE BIOPSY Interventional Radiologist:  Criselda Peaches, MD MEDICATIONS: None. ANESTHESIA/SEDATION: Moderate (conscious) sedation was employed during this procedure. A total of 2 milligrams versed and 100 micrograms fentanyl were administered intravenously. The patient's level of consciousness and vital signs were monitored continuously by radiology nursing throughout the procedure under my direct supervision. Total monitored sedation time: 10 minutes FLUOROSCOPY TIME:  Fluoroscopy Time: 0 minutes  0 seconds (0 mGy). COMPLICATIONS:  None immediate. Estimated blood loss: <25 mL PROCEDURE: Informed written consent was obtained from the patient after a thorough discussion of the procedural risks, benefits and alternatives. All questions were addressed. Maximal Sterile Barrier Technique was utilized including caps, mask, sterile gowns, sterile gloves, sterile drape, hand hygiene and skin antiseptic. A timeout was performed prior to the initiation of the procedure. The patient was positioned prone and non-contrast localization CT was performed of the pelvis to demonstrate the iliac marrow spaces. Maximal barrier sterile technique utilized including caps, mask, sterile gowns, sterile gloves, large sterile drape, hand hygiene, and betadine prep. Under sterile conditions and local anesthesia, an 11 gauge coaxial bone biopsy needle was advanced into the right iliac marrow space. Needle position was confirmed with CT imaging. Initially, bone marrow aspiration was performed. Next, the 11 gauge outer cannula was utilized to obtain a right iliac bone marrow core biopsy. Needle was removed. Hemostasis was obtained with compression. The patient tolerated the procedure well. Samples were prepared with the cytotechnologist. IMPRESSION: Technically successful right-sided bone marrow aspiration and core biopsy. Electronically Signed   By: Jacqulynn Cadet M.D.   On: 03/01/2018 12:42   Normal DEXA scan in June 2015     Cytogenetic analysis: Revealed the presence of a normal female chromosomes with no observable clonal chromosomal abnormalities.   ASSESSMENT & PLAN:   72 year old Caucasian female in good overall health with  #1Smoldering Multiple Myeloma IgG lambda. Bone marrow plasma cells more than 10% ( noted to have 35% clonal plasma cells] Normal female karyotype cytogenetics with no other abnormalities SPEP shows M spike of 2.2 (slightly decrease/stabiliyt from previous levels) with IFE showing IgG lambda  paraprotein. Associated with some Immunoparesis characterized by associated decrease in IgA and IgM.  M protein 2.6--> 2.6---> 2.2--->2.4--->2.3--> 2.6-->2.4->2.5--> 2.2--->3 -->2.7  SFLC K/L ratio low nl today Myeloma panel shows M spike down from3 to 2.7  Patient on follow-up today is feeling good and has no new focal symptoms or fatigue. No issues with infections. No renal insuff/hypercalcemia or new anemia. Plan --Discussed pt labwork today, 03/28/18; blood counts are normal and stable. M spike has improved from 3 to 2.7 -Discussed 03/05/18 cytogenetics report which revealed that her plasma cells make up 35% of her cells.  -Discussed 03/02/18 PET/CT which revealed No hypermetabolic osseous lesion on today's study. No hypermetabolic soft tissue disease in the neck, chest, abdomen, pelvis, or lower extremities -Recommend that pt repeat bone denoity scan, her last DEXA scan was on 03/28/14; she would like to have this by her next visit.  -Recommend Vitamin D replacement 2000 units daily -Discussed the reasons one might pursue treatment with pt and her husband -Pt does not currently have any of the CRAB criteria, nor Kappa:Lambda ratio criteria. Or bone marrow plasma cell >60% to justify starting myeloma treatment at this time.  -Would like to continue seeing pt every 3 months -Pt is aware to monitor for significant unexplained fatigue, persistent and new focal bone pain in the central skeleton.    DEXA scan in 3 months (same day as clinic visit) RTC with Dr Irene Limbo in 3 months Patient given prescription to get labs outside 7-10 days prior to clinic visit   All off the patients questions were answered to her apparent satisfaction. The patient knows to call the clinic with any problems, questions or concerns.  . The total time spent in the appointment was 25 minutes and more than 50% was on counseling and direct patient cares.  Sullivan Lone MD [place] AAHIVMS [facility] Hematology/Oncology  Physician [place]  (Office):       [phone] (Work cell):  [phone] (Fax):           [phone]   This document serves as a record of services personally performed by Sullivan Lone, MD. It was created on his behalf by Baldwin [place], a trained medical scribe. The creation of this record is based on the scribe's personal observations and the provider's statements to them.   .I have reviewed the above documentation for accuracy and completeness, and I agree with the above. Brunetta Genera MD [place]

## 2018-03-27 ENCOUNTER — Other Ambulatory Visit: Payer: Self-pay | Admitting: *Deleted

## 2018-03-27 DIAGNOSIS — C9000 Multiple myeloma not having achieved remission: Secondary | ICD-10-CM

## 2018-03-27 DIAGNOSIS — D472 Monoclonal gammopathy: Secondary | ICD-10-CM

## 2018-03-28 ENCOUNTER — Ambulatory Visit (INDEPENDENT_AMBULATORY_CARE_PROVIDER_SITE_OTHER): Payer: Medicare Other | Admitting: Orthopaedic Surgery

## 2018-03-28 ENCOUNTER — Inpatient Hospital Stay: Payer: Medicare Other | Attending: Hematology | Admitting: Hematology

## 2018-03-28 ENCOUNTER — Telehealth: Payer: Self-pay | Admitting: Hematology

## 2018-03-28 ENCOUNTER — Encounter (INDEPENDENT_AMBULATORY_CARE_PROVIDER_SITE_OTHER): Payer: Self-pay | Admitting: Orthopaedic Surgery

## 2018-03-28 ENCOUNTER — Inpatient Hospital Stay: Payer: Medicare Other

## 2018-03-28 ENCOUNTER — Encounter: Payer: Self-pay | Admitting: Hematology

## 2018-03-28 VITALS — BP 136/70 | HR 54 | Ht 67.0 in | Wt 144.0 lb

## 2018-03-28 VITALS — BP 143/74 | HR 55 | Resp 18 | Ht 66.0 in | Wt 144.9 lb

## 2018-03-28 DIAGNOSIS — C9000 Multiple myeloma not having achieved remission: Secondary | ICD-10-CM

## 2018-03-28 DIAGNOSIS — Z87891 Personal history of nicotine dependence: Secondary | ICD-10-CM | POA: Insufficient documentation

## 2018-03-28 DIAGNOSIS — D472 Monoclonal gammopathy: Secondary | ICD-10-CM

## 2018-03-28 DIAGNOSIS — M170 Bilateral primary osteoarthritis of knee: Secondary | ICD-10-CM

## 2018-03-28 DIAGNOSIS — M818 Other osteoporosis without current pathological fracture: Secondary | ICD-10-CM

## 2018-03-28 LAB — CBC WITH DIFFERENTIAL (CANCER CENTER ONLY)
Basophils Absolute: 0 10*3/uL (ref 0.0–0.1)
Basophils Relative: 1 %
Eosinophils Absolute: 0 10*3/uL (ref 0.0–0.5)
Eosinophils Relative: 1 %
HCT: 37.4 % (ref 34.8–46.6)
Hemoglobin: 12.8 g/dL (ref 11.6–15.9)
Lymphocytes Relative: 42 %
Lymphs Abs: 2.2 10*3/uL (ref 0.9–3.3)
MCH: 32.6 pg (ref 25.1–34.0)
MCHC: 34.4 g/dL (ref 31.5–36.0)
MCV: 94.9 fL (ref 79.5–101.0)
Monocytes Absolute: 0.3 10*3/uL (ref 0.1–0.9)
Monocytes Relative: 6 %
Neutro Abs: 2.6 10*3/uL (ref 1.5–6.5)
Neutrophils Relative %: 50 %
Platelet Count: 261 10*3/uL (ref 145–400)
RBC: 3.94 MIL/uL (ref 3.70–5.45)
RDW: 13.6 % (ref 11.2–14.5)
WBC Count: 5.1 10*3/uL (ref 3.9–10.3)

## 2018-03-28 LAB — CMP (CANCER CENTER ONLY)
ALT: 21 U/L (ref 0–55)
AST: 22 U/L (ref 5–34)
Albumin: 3.7 g/dL (ref 3.5–5.0)
Alkaline Phosphatase: 65 U/L (ref 40–150)
Anion gap: 8 (ref 3–11)
BUN: 18 mg/dL (ref 7–26)
CO2: 25 mmol/L (ref 22–29)
Calcium: 9.3 mg/dL (ref 8.4–10.4)
Chloride: 106 mmol/L (ref 98–109)
Creatinine: 0.94 mg/dL (ref 0.60–1.10)
GFR, Est AFR Am: >60 mL/min (ref >60)
GFR, Estimated: 59 mL/min — ABNORMAL LOW (ref >60)
Glucose, Bld: 83 mg/dL (ref 70–140)
Potassium: 4.8 mmol/L (ref 3.5–5.1)
Sodium: 139 mmol/L (ref 136–145)
Total Bilirubin: 0.5 mg/dL (ref 0.2–1.2)
Total Protein: 9.4 g/dL — ABNORMAL HIGH (ref 6.4–8.3)

## 2018-03-28 MED ORDER — METHYLPREDNISOLONE ACETATE 40 MG/ML IJ SUSP
80.0000 mg | INTRAMUSCULAR | Status: AC | PRN
  Administered 2018-03-28: 80 mg

## 2018-03-28 MED ORDER — BUPIVACAINE HCL 0.5 % IJ SOLN
2.0000 mL | INTRAMUSCULAR | Status: AC | PRN
  Administered 2018-03-28: 2 mL via INTRA_ARTICULAR

## 2018-03-28 MED ORDER — LIDOCAINE HCL 1 % IJ SOLN
2.0000 mL | INTRAMUSCULAR | Status: AC | PRN
  Administered 2018-03-28: 2 mL

## 2018-03-28 NOTE — Progress Notes (Signed)
Office Visit Note   Patient: Jennifer [name]           Date of Birth: 06/08/1946           MRN: [id number] Visit Date: 03/28/2018              Requested by: Jennifer Gearing, MD No address on file PCP: Jennifer Gearing, MD   Assessment & Plan: Visit Diagnoses:  1. Primary osteoarthritis of both knees     Plan: Long discussion regarding osteoarthritis of her right knee which is the more symptomatic recently.  Will inject the knee with cortisone today and start Euflexxa in 2 weeks.  Has had prior Euflexxa with good results.  Follow-Up Instructions: Return in about 2 weeks (around 04/11/2018).   Orders:  Orders Placed This Encounter  Procedures  . Large Joint Inj: R knee   No orders of the defined types were placed in this encounter.     Procedures: Large Joint Inj: R knee on 03/28/2018 12:01 PM Indications: pain and diagnostic evaluation Details: 25 G 1.5 in needle, anteromedial approach  Arthrogram: No  Medications: 2 mL lidocaine 1 %; 2 mL bupivacaine 0.5 %; 80 mg methylPREDNISolone acetate 40 MG/ML Procedure, treatment alternatives, risks and benefits explained, specific risks discussed. Consent was given by the patient. Immediately prior to procedure a time out was called to verify the correct patient, procedure, equipment, support staff and site/side marked as required. Patient was prepped and draped in the usual sterile fashion.       Clinical Data: No additional findings.   Subjective: Chief Complaint  Patient presents with  . Right Knee - Pain  . Follow-up    RECURRENT R KNEE PAIN LIKE INJECTION, LAST ONE WAS 10/2017  Jennifer [name] established diagnosis of osteoarthritis of both of her knees.  Recently she has had trouble with her right knee.  She has had cortisone in the past and Visco supplementation with excellent temporary relief of her pain.  She like to discuss repeat cortisone injection and possibly Visco supplementation.  She is very active athletically and  has been exercising and will take an occasional over-the-counter medicine.  Knee has been no swollen predominant pain along the anterior medial and anterior lateral joint line.  She has had prior films performed in October 2018 and straining tricompartmental degenerative changes with narrowing of the joint spaces.  Alignment appeared to be neutral.  There were areas of subchondral sclerosis and peripheral osteophyte formation  HPI  Review of Systems  Constitutional: Negative for fatigue and fever.  HENT: Negative for ear pain.   Eyes: Negative for pain.  Respiratory: Negative for cough and shortness of breath.   Cardiovascular: Positive for leg swelling.  Gastrointestinal: Negative for constipation and diarrhea.  Genitourinary: Negative for difficulty urinating.  Musculoskeletal: Positive for neck pain. Negative for back pain.  Skin: Negative for rash.  Allergic/Immunologic: Negative for food allergies.  Hematological: Does not bruise/bleed easily.  Psychiatric/Behavioral: Negative for sleep disturbance.     Objective: Vital Signs: BP 136/70 (BP Location: Left Arm, Patient Position: Sitting, Cuff Size: Normal)   Pulse (!) 54   Ht '5\' 7"'$  (1.702 m)   Wt 144 lb (65.3 kg)   LMP 11/07/1996   BMI 22.55 kg/m   Physical Exam  Constitutional: She is oriented to person, place, and time. She appears well-developed and well-nourished.  HENT:  Mouth/Throat: Oropharynx is clear and moist.  Eyes: Pupils are equal, round, and reactive to light. EOM are normal.  Pulmonary/Chest: Effort normal.  Neurological: She is alert and oriented to person, place, and time.  Skin: Skin is warm and dry.  Psychiatric: She has a normal mood and affect. Her behavior is normal.    Ortho Exam awake alert and oriented x3 comfortable sitting.  Very minimal effusion right knee.  Predominantly medial joint pain but some discomfort along the anteromedial and anterolateral joint line.  Full extension and flexion over  105 degrees without instability.  No distal edema.  Neurovascular exam intact  Specialty Comments:  No specialty comments available.  Imaging: No results found.   PMFS History: Patient Active Problem List   Diagnosis Date Noted  . Primary osteoarthritis of both knees 11/30/2017  . Primary osteoarthritis of both feet 11/30/2017  . History of rotator cuff tear repair, bilateral 11/30/2017  . Vitamin D deficiency 12/27/2015  . Smoldering multiple myeloma ([place]) 12/22/2015  . Hypergammaglobulinemia   . IgG monoclonal gammopathy of uncertain significance   . Abnormal gamma globulin level 04/14/2015  . Hyperlipidemia 04/03/2015  . Hyperglycemia 04/03/2015  . Diverticulosis of colon without hemorrhage 04/03/2015  . Arthralgia of multiple joints 04/03/2015  . Primary osteoarthritis of both hands 01/20/2010  . SKIN CANCER, HX OF 01/20/2010   Past Medical History:  Diagnosis Date  . Atrial fibrillation ([place]) 12/2010   PMH of ; [place] , [place] ER  . Blood transfusion during current hospitalization 9147   complication after childbirth  . Cancer ([place]) 2000   SKIN CANCER IN [place]  . DJD (degenerative joint disease)   . Skin cancer of face 2015   Patient reports this was likely basal cell on the right side of her face needing 14 stitches.  . Smoldering multiple myeloma ([place]) 2016    Family History  Problem Relation Age of Onset  . Atrial fibrillation Father   . Asthma Brother   . Alzheimer's disease Mother   . Myasthenia gravis Mother        ocular  . Breast cancer Paternal Grandmother   . Depression Daughter   . Colon cancer Neg Hx   . Rectal cancer Neg Hx   . Stomach cancer Neg Hx   . Diabetes Neg Hx   . Stroke Neg Hx   . Heart attack Neg Hx     Past Surgical History:  Procedure Laterality Date  . CHOLECYSTECTOMY  1990's  . COLONOSCOPY  02/2013   negative X 3; Dr Olevia Perches  . G 3 P 1    . SHOULDER SURGERY Right 2012   R ; Dr Durward Fortes for rotator tear  . SHOULDER SURGERY  Left 06/2014   Dr. Sydnee Cabal for rotator cuff  . T&A  child  . WISDOM TOOTH EXTRACTION  teenager   Social History   Occupational History  . Not on file  Tobacco Use  . Smoking status: Former Smoker    Packs/day: 0.25    Years: 3.00    Pack years: 0.75    Last attempt to quit: 11/07/1970    Years since quitting: 47.4  . Smokeless tobacco: Never Used  . Tobacco comment: 1/4 ppd [id number]  Substance and Sexual Activity  . Alcohol use: Yes    Alcohol/week: 6.0 oz    Types: 10 Glasses of wine per week  . Drug use: No  . Sexual activity: Never    Partners: Male    Birth control/protection: Abstinence

## 2018-03-28 NOTE — Patient Instructions (Signed)
Thank you for choosing [facility] to provide your oncology and hematology care.  To afford each patient quality time with our providers, please arrive 30 minutes before your scheduled appointment time.  If you arrive late for your appointment, you may be asked to reschedule.  We strive to give you quality time with our providers, and arriving late affects you and other patients whose appointments are after yours.   If you are a no show for multiple scheduled visits, you may be dismissed from the clinic at the providers discretion.    Again, thank you for choosing [facility], our hope is that these requests will decrease the amount of time that you wait before being seen by our physicians.  ______________________________________________________________________  Should you have questions after your visit to the [facility], please contact our office at [phone] between the hours of 8:30 and 4:30 p.m.    Voicemails left after 4:30p.m will not be returned until the following business day.    For prescription refill requests, please have your pharmacy contact us directly.  Please also try to allow 48 hours for prescription requests.    Please contact the scheduling department for questions regarding scheduling.  For scheduling of procedures such as PET scans, CT scans, MRI, Ultrasound, etc please contact central scheduling at [phone].    Resources For Cancer Patients and Caregivers:   Oncolink.org:  A wonderful resource for patients and healthcare providers for information regarding your disease, ways to tract your treatment, what to expect, etc.     American Cancer Society:  800-227-2345  Can help patients locate various types of support and financial assistance  Cancer Care: 1-800-813-HOPE (4673) Provides financial assistance, online support groups, medication/co-pay assistance.    [place] DSS:  336-641-3447 Where to apply for food  stamps, Medicaid, and utility assistance  Medicare Rights Center: 800-333-4114 Helps people with Medicare understand their rights and benefits, navigate the Medicare system, and secure the quality healthcare they deserve  SCAT: 336-333-6589 [place] [employer]'s shared-ride transportation service for eligible riders who have a disability that prevents them from riding the fixed route bus.    For additional information on assistance programs please contact our social worker:   Grier Hock/Abigail Elmore:  [phone]            

## 2018-03-28 NOTE — Telephone Encounter (Signed)
Appointments scheduled AVS/Calendar printed per 5/22 los °

## 2018-03-29 LAB — KAPPA/LAMBDA LIGHT CHAINS
Kappa free light chain: 11 mg/L (ref 3.3–19.4)
Kappa, lambda light chain ratio: 0.69 (ref 0.26–1.65)
Lambda free light chains: 16 mg/L (ref 5.7–26.3)

## 2018-03-30 ENCOUNTER — Other Ambulatory Visit: Payer: Self-pay | Admitting: *Deleted

## 2018-04-03 ENCOUNTER — Encounter: Payer: Self-pay | Admitting: Hematology

## 2018-04-03 DIAGNOSIS — J0100 Acute maxillary sinusitis, unspecified: Secondary | ICD-10-CM | POA: Diagnosis not present

## 2018-04-03 LAB — MULTIPLE MYELOMA PANEL, SERUM
Albumin SerPl Elph-Mcnc: 3.9 g/dL (ref 2.9–4.4)
Albumin/Glob SerPl: 0.9 (ref 0.7–1.7)
Alpha 1: 0.2 g/dL (ref 0.0–0.4)
Alpha2 Glob SerPl Elph-Mcnc: 0.7 g/dL (ref 0.4–1.0)
B-Globulin SerPl Elph-Mcnc: 0.9 g/dL (ref 0.7–1.3)
Gamma Glob SerPl Elph-Mcnc: 2.9 g/dL — ABNORMAL HIGH (ref 0.4–1.8)
Globulin, Total: 4.7 g/dL — ABNORMAL HIGH (ref 2.2–3.9)
IgA: 21 mg/dL — ABNORMAL LOW (ref 64–422)
IgG (Immunoglobin G), Serum: 3589 mg/dL — ABNORMAL HIGH (ref 700–1600)
IgM (Immunoglobulin M), Srm: 6 mg/dL — ABNORMAL LOW (ref 26–217)
M Protein SerPl Elph-Mcnc: 2.7 g/dL — ABNORMAL HIGH
Total Protein ELP: 8.6 g/dL — ABNORMAL HIGH (ref 6.0–8.5)

## 2018-04-05 ENCOUNTER — Encounter: Payer: Self-pay | Admitting: Hematology

## 2018-04-12 ENCOUNTER — Encounter: Payer: Self-pay | Admitting: Hematology

## 2018-04-14 ENCOUNTER — Encounter: Payer: Self-pay | Admitting: Hematology

## 2018-04-16 ENCOUNTER — Encounter: Payer: Self-pay | Admitting: Hematology

## 2018-04-18 ENCOUNTER — Encounter: Payer: Self-pay | Admitting: Hematology

## 2018-04-20 ENCOUNTER — Ambulatory Visit: Payer: Medicare Other | Admitting: Nurse Practitioner

## 2018-04-21 ENCOUNTER — Encounter: Payer: Self-pay | Admitting: Hematology

## 2018-04-24 ENCOUNTER — Telehealth: Payer: Self-pay

## 2018-04-24 NOTE — Telephone Encounter (Signed)
Spoke with patient concerning a upcoming appointment that was scheduled for her. Per 6/17 sch msg  To review lab results

## 2018-04-30 ENCOUNTER — Ambulatory Visit: Payer: Medicare Other | Admitting: Hematology

## 2018-04-30 DIAGNOSIS — M9902 Segmental and somatic dysfunction of thoracic region: Secondary | ICD-10-CM | POA: Diagnosis not present

## 2018-04-30 DIAGNOSIS — M9905 Segmental and somatic dysfunction of pelvic region: Secondary | ICD-10-CM | POA: Diagnosis not present

## 2018-04-30 DIAGNOSIS — M461 Sacroiliitis, not elsewhere classified: Secondary | ICD-10-CM | POA: Diagnosis not present

## 2018-04-30 DIAGNOSIS — M9903 Segmental and somatic dysfunction of lumbar region: Secondary | ICD-10-CM | POA: Diagnosis not present

## 2018-05-14 DIAGNOSIS — M9902 Segmental and somatic dysfunction of thoracic region: Secondary | ICD-10-CM | POA: Diagnosis not present

## 2018-05-14 DIAGNOSIS — M9905 Segmental and somatic dysfunction of pelvic region: Secondary | ICD-10-CM | POA: Diagnosis not present

## 2018-05-14 DIAGNOSIS — M9903 Segmental and somatic dysfunction of lumbar region: Secondary | ICD-10-CM | POA: Diagnosis not present

## 2018-05-14 DIAGNOSIS — M461 Sacroiliitis, not elsewhere classified: Secondary | ICD-10-CM | POA: Diagnosis not present

## 2018-06-11 DIAGNOSIS — M9903 Segmental and somatic dysfunction of lumbar region: Secondary | ICD-10-CM | POA: Diagnosis not present

## 2018-06-11 DIAGNOSIS — M461 Sacroiliitis, not elsewhere classified: Secondary | ICD-10-CM | POA: Diagnosis not present

## 2018-06-11 DIAGNOSIS — M9905 Segmental and somatic dysfunction of pelvic region: Secondary | ICD-10-CM | POA: Diagnosis not present

## 2018-06-11 DIAGNOSIS — M9902 Segmental and somatic dysfunction of thoracic region: Secondary | ICD-10-CM | POA: Diagnosis not present

## 2018-06-22 ENCOUNTER — Other Ambulatory Visit: Payer: Self-pay | Admitting: *Deleted

## 2018-06-22 DIAGNOSIS — C9000 Multiple myeloma not having achieved remission: Secondary | ICD-10-CM | POA: Diagnosis not present

## 2018-06-27 NOTE — Progress Notes (Signed)
   HEMATOLOGY/ONCOLOGY CLINIC NOTE  Date of Service: 06/28/18      Patient Care Team: Lewis, [name], MD as PCP - General (Family Medicine)  CHIEF COMPLAINTS/PURPOSE OF CONSULTATION:   Follow up for smoldering multiple myeloma  Diagnosis: Smoldering multiple myeloma  Treatment: Close monitoring  HISTORY OF PRESENTING ILLNESS: please see my initial consultation for details of her initial presentation  INTERVAL HISTORY  Ms Jennifer [name] is here for her scheduled interval follow-up of her smoldering multiple myeloma. The patient's last visit with us was on 03/28/18. She is accompanied today by her husband. The pt reports that she is doing well overall.   The pt reports that she has remained very active, playing lots of pickle ball. She notes that her knees, ankles, lower back have intermittently been a little painful which she notes all resolve on their own and could be attributable to her sporting activities.   Lab results (06/22/18) of CBC w/diff, CMP, and Reticulocytes is as follows: all values are WNL except for RBC at 3.93, Glucose at 103, Total Protein at 9.6, Globulin at 5.5, BUN:Creatinine at 21.9. 06/22/18 MMP reveals all values WNL except for Total Protein at 8.6, Alpha 1 Globulin at 0.4, Beta 2 Globulin at 0.1, Gamma globulin at 3.0, and M Protein at 3.0 06/22/18 Serum Immunoglobulins revealed IgM at <5, IgG at 3673, and IgA at 15  On review of systems, pt reports intermittent knee and ankle pain, intermittent lower back pain, expected weight loss, staying very active, and denies progressive pain, new bone pains, abdominal pains, swollen/red joints, and any other symptoms.   MEDICAL HISTORY:  Past Medical History:  Diagnosis Date  . Atrial fibrillation ([facility]) 12/2010   PMH of ; [place] , [place] ER  . Blood transfusion during current hospitalization 1980   complication after childbirth  . Cancer ([facility]) 2000   SKIN CANCER IN [place]  . DJD (degenerative joint disease)   . Skin cancer  of face 2015   Patient reports this was likely basal cell on the right side of her face needing 14 stitches.  . Smoldering multiple myeloma ([facility]) 2016   . Patient Active Problem List   Diagnosis Date Noted  . Primary osteoarthritis of both knees 11/30/2017  . Primary osteoarthritis of both feet 11/30/2017  . History of rotator cuff tear repair, bilateral 11/30/2017  . Vitamin D deficiency 12/27/2015  . 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 Hypergammaglobulinemia   . IgG monoclonal gammopathy of uncertain significance   . Abnormal gamma globulin level 04/14/2015  . Hyperlipidemia 04/03/2015  . Hyperglycemia 04/03/2015  . Diverticulosis of colon without hemorrhage 04/03/2015  . Arthralgia of multiple joints 04/03/2015  . Primary osteoarthritis of both hands 01/20/2010  . SKIN CANCER, HX OF 01/20/2010    SURGICAL HISTORY: Past Surgical History:  Procedure Laterality Date  . CHOLECYSTECTOMY  1990's  . COLONOSCOPY  02/2013   negative X 3; Dr Brodie  . G 3 P 1    . SHOULDER SURGERY Right 2012   R ; Dr Whitfield for rotator tear  . SHOULDER SURGERY Left 06/2014   Dr. Whitfied for rotator cuff  . T&A  child  . WISDOM TOOTH EXTRACTION  teenager    SOCIAL HISTORY: Social History   Socioeconomic History  . Marital status: Married    Spouse name: Not on file  . Number of children: Not on file  . Years of education: Not on file  . Highest education level: Not on file  Occupational   History  . Not on file  Social Needs  . Financial resource strain: Not on file  . Food insecurity:    Worry: Not on file    Inability: Not on file  . Transportation needs:    Medical: Not on file    Non-medical: Not on file  Tobacco Use  . Smoking status: Former Smoker    Packs/day: 0.25    Years: 3.00    Pack years: 0.75    Last attempt to quit: 11/07/1970    Years since quitting: 47.6  . Smokeless tobacco: Never Used  . Tobacco comment: 1/4 ppd [id number]  Substance and Sexual  Activity  . Alcohol use: Yes    Alcohol/week: 10.0 standard drinks    Types: 10 Glasses of wine per week  . Drug use: No  . Sexual activity: Never    Partners: Male    Birth control/protection: Abstinence  Lifestyle  . Physical activity:    Days per week: Not on file    Minutes per session: Not on file  . Stress: Not on file  Relationships  . Social connections:    Talks on phone: Not on file    Gets together: Not on file    Attends religious service: Not on file    Active member of club or organization: Not on file    Attends meetings of clubs or organizations: Not on file    Relationship status: Not on file  . Intimate partner violence:    Fear of current or ex partner: Not on file    Emotionally abused: Not on file    Physically abused: Not on file    Forced sexual activity: Not on file  Other Topics Concern  . Not on file  Social History Narrative  . Not on file    FAMILY HISTORY: Family History  Problem Relation Age of Onset  . Atrial fibrillation Father   . Asthma Brother   . Alzheimer's disease Mother   . Myasthenia gravis Mother        ocular  . Breast cancer Paternal Grandmother   . Depression Daughter   . Colon cancer Neg Hx   . Rectal cancer Neg Hx   . Stomach cancer Neg Hx   . Diabetes Neg Hx   . Stroke Neg Hx   . Heart attack Neg Hx     ALLERGIES:  has No Known Allergies.  MEDICATIONS:  Current Outpatient Medications  Medication Sig Dispense Refill  . Ginger, Zingiber officinalis, (GINGER ROOT PO) Take by mouth daily.    Marland Kitchen ibuprofen (ADVIL,MOTRIN) 200 MG tablet Take 200 mg by mouth 2 (two) times daily.    . Misc Natural Products (TART CHERRY ADVANCED PO) Take by mouth daily.    . Turmeric (CURCUMIN 95 PO) Take by mouth daily.    . TURMERIC PO Take by mouth daily.     No current facility-administered medications for this visit.     REVIEW OF SYSTEMS:    A 10+ POINT REVIEW OF SYSTEMS WAS OBTAINED including neurology, dermatology,  psychiatry, cardiac, respiratory, lymph, extremities, GI, GU, Musculoskeletal, constitutional, breasts, reproductive, HEENT.  All pertinent positives are noted in the HPI.  All others are negative.   PHYSICAL EXAMINATION: ECOG PERFORMANCE STATUS: 0-1 .BP (!) 145/82 (BP Location: Left Arm, Patient Position: Sitting) Comment: Notified Nurse of Bp  Pulse (!) 53   Temp 97.8 F (36.6 C) (Oral)   Resp 18   Ht 5' 7" (1.702 m)   Wt 136  lb 14.4 oz (62.1 kg)   LMP 11/07/1996   SpO2 100%   BMI 21.44 kg/m   Filed Weights   06/28/18 0950  Weight: 136 lb 14.4 oz (62.1 kg)   .Body mass index is 21.44 kg/m.  GENERAL:alert, in no acute distress and comfortable SKIN: no acute rashes, no significant lesions EYES: conjunctiva are pink and non-injected, sclera anicteric OROPHARYNX: MMM, no exudates, no oropharyngeal erythema or ulceration NECK: supple, no JVD LYMPH:  no palpable lymphadenopathy in the cervical, axillary or inguinal regions LUNGS: clear to auscultation b/l with normal respiratory effort HEART: regular rate & rhythm ABDOMEN:  normoactive bowel sounds , non tender, not distended. No palpable hepatosplenomegaly.  Extremity: no pedal edema PSYCH: alert & oriented x 3 with fluent speech NEURO: no focal motor/sensory deficits   LABORATORY DATA:  I have reviewed the data as listed  . CBC Latest Ref Rng & Units 03/28/2018 03/01/2018 02/01/2018  WBC 3.9 - 10.3 K/uL 5.1 5.2 5.6  Hemoglobin 11.6 - 15.9 g/dL 12.8 12.8 12.6  Hematocrit 34.8 - 46.6 % 37.4 37.3 35.9  Platelets 145 - 400 K/uL 261 255 293   . CBC    Component Value Date/Time   WBC 5.1 03/28/2018 0841   WBC 5.2 03/01/2018 0930   RBC 3.94 03/28/2018 0841   HGB 12.8 03/28/2018 0841   HGB 12.4 10/26/2017 1002   HCT 37.4 03/28/2018 0841   HCT 36.2 10/26/2017 1002   PLT 261 03/28/2018 0841   PLT 235 10/26/2017 1002   MCV 94.9 03/28/2018 0841   MCV 96.0 10/26/2017 1002   MCH 32.6 03/28/2018 0841   MCHC 34.4 03/28/2018  0841   RDW 13.6 03/28/2018 0841   RDW 12.7 10/26/2017 1002   LYMPHSABS 2.2 03/28/2018 0841   LYMPHSABS 2.4 10/26/2017 1002   MONOABS 0.3 03/28/2018 0841   MONOABS 0.2 10/26/2017 1002   EOSABS 0.0 03/28/2018 0841   EOSABS 0.1 10/26/2017 1002   BASOSABS 0.0 03/28/2018 0841   BASOSABS 0.0 10/26/2017 1002    . CMP Latest Ref Rng & Units 03/28/2018 02/01/2018 10/26/2017  Glucose 70 - 140 mg/dL 83 100 91  BUN 7 - 26 mg/dL 18 23 17.9  Creatinine 0.60 - 1.10 mg/dL 0.94 0.96 0.9  Sodium 136 - 145 mmol/L 139 137 138  Potassium 3.5 - 5.1 mmol/L 4.8 4.6 5.1  Chloride 98 - 109 mmol/L 106 103 -  CO2 22 - 29 mmol/L 25 26 24  Calcium 8.4 - 10.4 mg/dL 9.3 9.7 9.2  Total Protein 6.4 - 8.3 g/dL 9.4(H) 9.3(H) 8.8(H)  Total Bilirubin 0.2 - 1.2 mg/dL 0.5 0.4 0.48  Alkaline Phos 40 - 150 U/L 65 59 59  AST 5 - 34 U/L 22 16 17  ALT 0 - 55 U/L 21 18 18         03/01/18 Cytogenetics:   03/01/18 Bx:      RADIOGRAPHIC STUDIES: I have personally reviewed the radiological images as listed and agreed with the findings in the report. No results found. Normal DEXA scan in June 2015     Cytogenetic analysis: Revealed the presence of a normal female chromosomes with no observable clonal chromosomal abnormalities.   ASSESSMENT & PLAN:   71-year-old Caucasian female in good overall health with  #1Smoldering Multiple Myeloma IgG lambda. Bone marrow plasma cells more than 10% ( noted to have 35% clonal plasma cells] Normal female karyotype cytogenetics with no other abnormalities SPEP shows M spike of 2.2 (slightly decrease/stabiliyt from previous levels) with   IFE showing IgG lambda paraprotein. Associated with some Immunoparesis characterized by associated decrease in IgA and IgM.  M protein 2.6--> 2.6---> 2.2--->2.4--->2.3--> 2.6-->2.4->2.5--> 2.2--->3 -->2.7-->.3  03/02/18 PET/CT revealed No hypermetabolic osseous lesion on today's study. No hypermetabolic soft tissue disease in the neck,  chest, abdomen, pelvis, or lower extremities   03/05/18 cytogenetics report revealed that her plasma cells make up 35% of her cells.    PLAN:  -Recommend that pt repeat bone denoity scan, her last DEXA scan was on 03/28/14; she would like to have this by her next visit.  -Recommend Vitamin D replacement 2000 units daily -Discussed the reasons one might pursue treatment with pt and her husband -Pt does not currently have any of the CRAB criteria, nor Kappa:Lambda ratio criteria. Or bone marrow plasma cell >60% to justify starting myeloma treatment at this time.  -Pt is aware to monitor for significant unexplained fatigue, persistent and new focal bone pain in the central skeleton.  -Discussed pt labwork from 06/22/18; Creatinine within normal limits, no hypercalcemia, no anemia. M Protein stable at 3.0 with IgG at 3673 -Pt continues to not have new bone pains, does note some joint pain that is not progressive and resolves on its own, most likely from her frequent pickle ball play  -The pt shows no clinical or lab progression of her smoldering myeloma -Will see the pt back in 4 months, sooner if any new concerns   RTC with Dr Kale in 1st week of Jan 2020    All off the patients questions were answered to her apparent satisfaction. The patient knows to call the clinic with any problems, questions or concerns.  The total time spent in the appt was 20 minutes and more than 50% was on counseling and direct patient cares.    Gautam Kale MD MS AAHIVMS [facility] Hematology/Oncology Physician [facility]  (Office):       [phone] (Work cell):  [phone] (Fax):           [phone]  I, Schuyler Bain, am acting as a scribe for Dr. Kale  .I have reviewed the above documentation for accuracy and completeness, and I agree with the above. .Gautam Kishore Kale MD   

## 2018-06-28 ENCOUNTER — Inpatient Hospital Stay: Payer: Medicare Other | Attending: Hematology | Admitting: Hematology

## 2018-06-28 VITALS — BP 145/82 | HR 53 | Temp 97.8°F | Resp 18 | Ht 67.0 in | Wt 136.9 lb

## 2018-06-28 DIAGNOSIS — D472 Monoclonal gammopathy: Secondary | ICD-10-CM

## 2018-06-28 DIAGNOSIS — C9000 Multiple myeloma not having achieved remission: Secondary | ICD-10-CM

## 2018-06-28 DIAGNOSIS — Z87891 Personal history of nicotine dependence: Secondary | ICD-10-CM

## 2018-06-29 ENCOUNTER — Telehealth: Payer: Self-pay

## 2018-06-29 NOTE — Telephone Encounter (Signed)
Order written by Dr. Irene Limbo for labs to be drawn in January 2020 faxed to [facility] at [phone]. Confirmation of fax received.

## 2018-06-29 NOTE — Telephone Encounter (Signed)
Spoke with patient concerning her upcoming appointment that was scheduled in January. Per 8/22 los Also mailed her a letter with a calender enclosed.

## 2018-07-02 DIAGNOSIS — M9905 Segmental and somatic dysfunction of pelvic region: Secondary | ICD-10-CM | POA: Diagnosis not present

## 2018-07-02 DIAGNOSIS — M9903 Segmental and somatic dysfunction of lumbar region: Secondary | ICD-10-CM | POA: Diagnosis not present

## 2018-07-02 DIAGNOSIS — M461 Sacroiliitis, not elsewhere classified: Secondary | ICD-10-CM | POA: Diagnosis not present

## 2018-07-02 DIAGNOSIS — M9902 Segmental and somatic dysfunction of thoracic region: Secondary | ICD-10-CM | POA: Diagnosis not present

## 2018-07-04 ENCOUNTER — Ambulatory Visit (INDEPENDENT_AMBULATORY_CARE_PROVIDER_SITE_OTHER): Payer: Medicare Other | Admitting: Orthopaedic Surgery

## 2018-07-04 ENCOUNTER — Ambulatory Visit (INDEPENDENT_AMBULATORY_CARE_PROVIDER_SITE_OTHER): Payer: Medicare Other

## 2018-07-04 ENCOUNTER — Encounter (INDEPENDENT_AMBULATORY_CARE_PROVIDER_SITE_OTHER): Payer: Self-pay | Admitting: Orthopaedic Surgery

## 2018-07-04 VITALS — BP 119/63 | HR 56 | Ht 66.0 in | Wt 139.0 lb

## 2018-07-04 DIAGNOSIS — M25561 Pain in right knee: Secondary | ICD-10-CM | POA: Diagnosis not present

## 2018-07-04 DIAGNOSIS — M170 Bilateral primary osteoarthritis of knee: Secondary | ICD-10-CM

## 2018-07-04 DIAGNOSIS — G8929 Other chronic pain: Secondary | ICD-10-CM

## 2018-07-04 MED ORDER — LIDOCAINE HCL 1 % IJ SOLN
2.0000 mL | INTRAMUSCULAR | Status: AC | PRN
  Administered 2018-07-04: 2 mL

## 2018-07-04 MED ORDER — METHYLPREDNISOLONE ACETATE 40 MG/ML IJ SUSP
80.0000 mg | INTRAMUSCULAR | Status: AC | PRN
  Administered 2018-07-04: 80 mg

## 2018-07-04 MED ORDER — BUPIVACAINE HCL 0.5 % IJ SOLN
2.0000 mL | INTRAMUSCULAR | Status: AC | PRN
  Administered 2018-07-04: 2 mL via INTRA_ARTICULAR

## 2018-07-04 NOTE — Progress Notes (Signed)
Office Visit Note   Patient: Jennifer [name]           Date of Birth: 08-05-1946           MRN: [id number] Visit Date: 07/04/2018              Requested by: Jennifer Gearing, MD No address on file PCP: Jennifer Gearing, MD   Assessment & Plan: Visit Diagnoses:  1. Chronic pain of right knee   2. Primary osteoarthritis of both knees     Plan: Progressive osteoarthritis right knee that is becoming more and more recalcitrant to treatment.  Long discussion over about 30 minutes regarding different treatment options including total knee replacement.  Discussed the surgery incision, hospitalization, rehab.  Will inject knee with cortisone today  Follow-Up Instructions: Return if symptoms worsen or fail to improve.   Orders:  Orders Placed This Encounter  Procedures  . Large Joint Inj: R knee  . XR KNEE 3 VIEW RIGHT   No orders of the defined types were placed in this encounter.     Procedures: Large Joint Inj: R knee on 07/04/2018 10:33 AM Indications: pain and diagnostic evaluation Details: 25 G 1.5 in needle, anteromedial approach  Arthrogram: No  Medications: 2 mL lidocaine 1 %; 2 mL bupivacaine 0.5 %; 80 mg methylPREDNISolone acetate 40 MG/ML Procedure, treatment alternatives, risks and benefits explained, specific risks discussed. Consent was given by the patient. Immediately prior to procedure a time out was called to verify the correct patient, procedure, equipment, support staff and site/side marked as required. Patient was prepped and draped in the usual sterile fashion.       Clinical Data: No additional findings.   Subjective: Chief Complaint  Patient presents with  . Follow-up    R KNEE, L HIP AND L SHOULDER PAIN.  R KNEE WAS WORSE  Jennifer [name] called over a long period of time i.e. years for the osteoarthritis in her right knee.  She is had cortisone and several courses of Visco supplementation which have helped in the past.  Lately they have only provided  temporary relief of her pain.  She is having more more trouble with her right knee pain to the point of compromise.  She is  very active playing pickle ball.  Uses some over-the-counter medicines which  help as well as a brace.  HPI  Review of Systems  Constitutional: Negative for fatigue and fever.  HENT: Negative for ear pain.   Eyes: Negative for pain.  Respiratory: Negative for cough and shortness of breath.   Cardiovascular: Negative for leg swelling.  Gastrointestinal: Negative for constipation and diarrhea.  Genitourinary: Negative for difficulty urinating.  Musculoskeletal: Negative for back pain and neck pain.  Skin: Negative for rash.  Allergic/Immunologic: Negative for food allergies.  Neurological: Positive for weakness. Negative for numbness.  Hematological: Does not bruise/bleed easily.  Psychiatric/Behavioral: Positive for sleep disturbance.     Objective: Vital Signs: BP 119/63 (BP Location: Left Arm, Patient Position: Sitting, Cuff Size: Normal)   Pulse (!) 56   Ht '5\' 6"'$  (1.676 m)   Wt 139 lb (63 kg)   LMP 11/07/1996   BMI 22.44 kg/m   Physical Exam  Constitutional: She is oriented to person, place, and time. She appears well-developed and well-nourished.  HENT:  Mouth/Throat: Oropharynx is clear and moist.  Eyes: Pupils are equal, round, and reactive to light. EOM are normal.  Pulmonary/Chest: Effort normal.  Neurological: She is alert and oriented to person, place,  and time.  Skin: Skin is warm and dry.  Psychiatric: She has a normal mood and affect. Her behavior is normal.    Ortho Exam awake alert and oriented x3.  Comfortable sitting.  Minimal effusion right knee.  Lacks just a few degrees to full extension compared to her left knee.  Mild medial joint pain.  Considerable patellar crepitation with minimal discomfort.  No pain laterally.  Seems to have slight varus with weightbearing.  Straight leg raise negative.  Painless range of motion right hip.  No  distal edema.  Neurovascular exam intact Specialty Comments:  No specialty comments available.  Imaging: Xr Knee 3 View Right  Result Date: 07/04/2018 Of the right knee were obtained in 3 projections standing and compared to films that were performed in 2017.  There is progression of osteoarthritis in all 3 compartments.  There is narrowing of all the joints with peripheral osteophytes in all 3 compartments and subchondral sclerosis particularly medially.  Probably 1 degree of varus.  No acute changes    PMFS History: Patient Active Problem List   Diagnosis Date Noted  . Primary osteoarthritis of both knees 11/30/2017  . Primary osteoarthritis of both feet 11/30/2017  . History of rotator cuff tear repair, bilateral 11/30/2017  . Vitamin D deficiency 12/27/2015  . 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 Hypergammaglobulinemia   . IgG monoclonal gammopathy of uncertain significance   . Abnormal gamma globulin level 04/14/2015  . Hyperlipidemia 04/03/2015  . Hyperglycemia 04/03/2015  . Diverticulosis of colon without hemorrhage 04/03/2015  . Arthralgia of multiple joints 04/03/2015  . Primary osteoarthritis of both hands 01/20/2010  . SKIN CANCER, HX OF 01/20/2010   Past Medical History:  Diagnosis Date  . Atrial fibrillation ([place]) 12/2010   PMH of ; [place] , [place] [facility]  . Blood transfusion during current hospitalization 6378   complication after childbirth  . Cancer ([place]) 2000   SKIN CANCER IN [place]  . DJD (degenerative joint disease)   . Skin cancer of face 2015   Patient reports this was likely basal cell on the right side of her face needing 14 stitches.  . Smoldering multiple myeloma ([place]) 2016    Family History  Problem Relation Age of Onset  . Atrial fibrillation Father   . Asthma Brother   . Alzheimer's disease Mother   . Myasthenia gravis Mother        ocular  . Breast cancer Paternal Grandmother   . Depression Daughter   . Colon cancer Neg Hx   .  Rectal cancer Neg Hx   . Stomach cancer Neg Hx   . Diabetes Neg Hx   . Stroke Neg Hx   . Heart attack Neg Hx     Past Surgical History:  Procedure Laterality Date  . CHOLECYSTECTOMY  1990's  . COLONOSCOPY  02/2013   negative X 3; Dr Olevia Perches  . G 3 P 1    . SHOULDER SURGERY Right 2012   R ; Dr Durward Fortes for rotator tear  . SHOULDER SURGERY Left 06/2014   Dr. Sydnee Cabal for rotator cuff  . T&A  child  . WISDOM TOOTH EXTRACTION  teenager   Social History   Occupational History  . Not on file  Tobacco Use  . Smoking status: Former Smoker    Packs/day: 0.25    Years: 3.00    Pack years: 0.75    Last attempt to quit: 11/07/1970    Years since quitting: 47.6  . Smokeless tobacco:  Never Used  . Tobacco comment: 1/4 ppd [id number]  Substance and Sexual Activity  . Alcohol use: Yes    Alcohol/week: 10.0 standard drinks    Types: 10 Glasses of wine per week  . Drug use: No  . Sexual activity: Never    Partners: Male    Birth control/protection: Abstinence

## 2018-08-10 DIAGNOSIS — M9903 Segmental and somatic dysfunction of lumbar region: Secondary | ICD-10-CM | POA: Diagnosis not present

## 2018-08-10 DIAGNOSIS — M9905 Segmental and somatic dysfunction of pelvic region: Secondary | ICD-10-CM | POA: Diagnosis not present

## 2018-08-10 DIAGNOSIS — M9902 Segmental and somatic dysfunction of thoracic region: Secondary | ICD-10-CM | POA: Diagnosis not present

## 2018-08-10 DIAGNOSIS — M461 Sacroiliitis, not elsewhere classified: Secondary | ICD-10-CM | POA: Diagnosis not present

## 2018-08-24 DIAGNOSIS — M461 Sacroiliitis, not elsewhere classified: Secondary | ICD-10-CM | POA: Diagnosis not present

## 2018-08-24 DIAGNOSIS — M9903 Segmental and somatic dysfunction of lumbar region: Secondary | ICD-10-CM | POA: Diagnosis not present

## 2018-08-24 DIAGNOSIS — M9905 Segmental and somatic dysfunction of pelvic region: Secondary | ICD-10-CM | POA: Diagnosis not present

## 2018-08-24 DIAGNOSIS — M9902 Segmental and somatic dysfunction of thoracic region: Secondary | ICD-10-CM | POA: Diagnosis not present

## 2018-08-29 ENCOUNTER — Ambulatory Visit (INDEPENDENT_AMBULATORY_CARE_PROVIDER_SITE_OTHER): Payer: Medicare Other | Admitting: Orthopedic Surgery

## 2018-08-31 DIAGNOSIS — M9903 Segmental and somatic dysfunction of lumbar region: Secondary | ICD-10-CM | POA: Diagnosis not present

## 2018-08-31 DIAGNOSIS — M461 Sacroiliitis, not elsewhere classified: Secondary | ICD-10-CM | POA: Diagnosis not present

## 2018-08-31 DIAGNOSIS — M9905 Segmental and somatic dysfunction of pelvic region: Secondary | ICD-10-CM | POA: Diagnosis not present

## 2018-08-31 DIAGNOSIS — M9902 Segmental and somatic dysfunction of thoracic region: Secondary | ICD-10-CM | POA: Diagnosis not present

## 2018-09-04 NOTE — Progress Notes (Addendum)
PCP: [name], MD  Cardiologist: pt denies  EKG: 09/05/18 -obtain at PAT appt  Stress test:  Pt denies  ECHO: pt denies  Cardiac Cath: pt denies  Chest x-ray: 09/05/18 -obtain at PAT appt

## 2018-09-04 NOTE — Pre-Procedure Instructions (Signed)
JANNETTA MASSEY  09/04/2018      [id number] - [place], [place] - [address] [place] [address] Phone: [phone] Fax: [phone]    Your procedure is scheduled on September 18, 2018.  Report to [facility] Admitting at 530 AM.  Call this number if you have problems the morning of surgery:  [phone]   Remember:  Do not eat or drink after midnight.    Take these medicines the morning of surgery with A SIP OF WATER - None  7 days prior to surgery STOP taking any Aspirin (unless otherwise instructed by your surgeon), Aleve, Naproxen, Ibuprofen, Motrin, Advil, Goody's, BC's, all herbal medications, fish oil, and all vitamins     Do not wear jewelry, make-up or nail polish.  Do not wear lotions, powders, or perfumes, or deodorant.  Do not shave 48 hours prior to surgery.    Do not bring valuables to the hospital.  [facility] is not responsible for any belongings or valuables.  Contacts, dentures or bridgework may not be worn into surgery.  Leave your suitcase in the car.  After surgery it may be brought to your room.  For patients admitted to the hospital, discharge time will be determined by your treatment team.  Patients discharged the day of surgery will not be allowed to drive home.    [facility] Preparing For [facility]  Before surgery, you can play an important role. Because skin is not sterile, your skin needs to be as free of germs as possible. You can reduce the number of germs on your skin by washing with CHG (chlorahexidine gluconate) Soap before surgery.  CHG is an antiseptic cleaner which kills germs and bonds with the skin to continue killing germs even after washing.    Oral Hygiene is also important to reduce your risk of infection.  Remember - BRUSH YOUR TEETH THE MORNING OF SURGERY WITH YOUR REGULAR TOOTHPASTE  Please do not use if you have an allergy to CHG or antibacterial soaps. If your  skin becomes reddened/irritated stop using the CHG.  Do not shave (including legs and underarms) for at least 48 hours prior to first CHG shower. It is OK to shave your face.  Please follow these instructions carefully.   1. Shower the NIGHT BEFORE SURGERY and the MORNING OF SURGERY with CHG.   2. If you chose to wash your hair, wash your hair first as usual with your normal shampoo.  3. After you shampoo, rinse your hair and body thoroughly to remove the shampoo.  4. Use CHG as you would any other liquid soap. You can apply CHG directly to the skin and wash gently with a scrungie or a clean washcloth.   5. Apply the CHG Soap to your body ONLY FROM THE NECK DOWN.  Do not use on open wounds or open sores. Avoid contact with your eyes, ears, mouth and genitals (private parts). Wash Face and genitals (private parts)  with your normal soap.  6. Wash thoroughly, paying special attention to the area where your surgery will be performed.  7. Thoroughly rinse your body with warm water from the neck down.  8. DO NOT shower/wash with your normal soap after using and rinsing off the CHG Soap.  9. Pat yourself dry with a CLEAN TOWEL.  10. Wear CLEAN PAJAMAS to bed the night before surgery, wear comfortable clothes the morning of surgery  11. Place CLEAN  SHEETS on your bed the night of your first shower and DO NOT SLEEP WITH PETS.  Day of Surgery:  Do not apply any deodorants/lotions.  Please wear clean clothes to the hospital/surgery center.   Remember to brush your teeth WITH YOUR REGULAR TOOTHPASTE.  Please read over the fact sheets that you were given.

## 2018-09-05 ENCOUNTER — Encounter (INDEPENDENT_AMBULATORY_CARE_PROVIDER_SITE_OTHER): Payer: Self-pay | Admitting: Orthopedic Surgery

## 2018-09-05 ENCOUNTER — Encounter (HOSPITAL_COMMUNITY): Payer: Self-pay

## 2018-09-05 ENCOUNTER — Encounter (HOSPITAL_COMMUNITY)
Admission: RE | Admit: 2018-09-05 | Discharge: 2018-09-05 | Disposition: A | Discharge status: Home / Self Care | Payer: Medicare Other | Source: Ambulatory Visit | Attending: Orthopaedic Surgery | Admitting: Orthopaedic Surgery

## 2018-09-05 ENCOUNTER — Other Ambulatory Visit: Payer: Self-pay

## 2018-09-05 ENCOUNTER — Ambulatory Visit (HOSPITAL_COMMUNITY)
Admission: RE | Admit: 2018-09-05 | Discharge: 2018-09-05 | Disposition: A | Discharge status: Home / Self Care | Payer: Medicare Other | Source: Ambulatory Visit | Attending: Orthopedic Surgery | Admitting: Orthopedic Surgery

## 2018-09-05 ENCOUNTER — Ambulatory Visit (INDEPENDENT_AMBULATORY_CARE_PROVIDER_SITE_OTHER): Payer: Medicare Other | Admitting: Orthopedic Surgery

## 2018-09-05 VITALS — BP 123/74 | HR 61 | Temp 97.7°F | Resp 14 | Ht 66.5 in | Wt 134.5 lb

## 2018-09-05 DIAGNOSIS — Z01818 Encounter for other preprocedural examination: Secondary | ICD-10-CM

## 2018-09-05 DIAGNOSIS — R911 Solitary pulmonary nodule: Secondary | ICD-10-CM | POA: Diagnosis not present

## 2018-09-05 DIAGNOSIS — M1711 Unilateral primary osteoarthritis, right knee: Secondary | ICD-10-CM | POA: Diagnosis not present

## 2018-09-05 LAB — COMPREHENSIVE METABOLIC PANEL
ALT: 21 U/L (ref 0–44)
AST: 22 U/L (ref 15–41)
Albumin: 3.4 g/dL — ABNORMAL LOW (ref 3.5–5.0)
Alkaline Phosphatase: 45 U/L (ref 38–126)
Anion gap: 7 (ref 5–15)
BUN: 12 mg/dL (ref 8–23)
CO2: 24 mmol/L (ref 22–32)
Calcium: 9.3 mg/dL (ref 8.9–10.3)
Chloride: 106 mmol/L (ref 98–111)
Creatinine, Ser: 0.83 mg/dL (ref 0.44–1.00)
GFR calc Af Amer: >60 mL/min (ref >60)
GFR calc non Af Amer: >60 mL/min (ref >60)
Glucose, Bld: 128 mg/dL — ABNORMAL HIGH (ref 70–99)
Potassium: 4.1 mmol/L (ref 3.5–5.1)
Sodium: 137 mmol/L (ref 135–145)
Total Bilirubin: 0.9 mg/dL (ref 0.3–1.2)
Total Protein: 9.2 g/dL — ABNORMAL HIGH (ref 6.5–8.1)

## 2018-09-05 LAB — CBC WITH DIFFERENTIAL/PLATELET
Abs Immature Granulocytes: 0.02 10*3/uL (ref 0.00–0.07)
Basophils Absolute: 0 10*3/uL (ref 0.0–0.1)
Basophils Relative: 1 %
Eosinophils Absolute: 0 10*3/uL (ref 0.0–0.5)
Eosinophils Relative: 1 %
HCT: 38.3 % (ref 36.0–46.0)
Hemoglobin: 12.6 g/dL (ref 12.0–15.0)
Immature Granulocytes: 0 %
Lymphocytes Relative: 41 %
Lymphs Abs: 2.5 10*3/uL (ref 0.7–4.0)
MCH: 32.1 pg (ref 26.0–34.0)
MCHC: 32.9 g/dL (ref 30.0–36.0)
MCV: 97.7 fL (ref 80.0–100.0)
Monocytes Absolute: 0.2 10*3/uL (ref 0.1–1.0)
Monocytes Relative: 3 %
Neutro Abs: 3.3 10*3/uL (ref 1.7–7.7)
Neutrophils Relative %: 54 %
Platelets: 278 10*3/uL (ref 150–400)
RBC: 3.92 MIL/uL (ref 3.87–5.11)
RDW: 12.2 % (ref 11.5–15.5)
WBC: 6.1 10*3/uL (ref 4.0–10.5)
nRBC: 0 % (ref 0.0–0.2)

## 2018-09-05 LAB — TYPE AND SCREEN
ABO/RH(D): O POS
Antibody Screen: NEGATIVE

## 2018-09-05 LAB — SURGICAL PCR SCREEN
MRSA, PCR: NEGATIVE
Staphylococcus aureus: NEGATIVE

## 2018-09-05 LAB — ABO/RH: ABO/RH(D): O POS

## 2018-09-05 LAB — APTT: aPTT: 27 seconds (ref 24–36)

## 2018-09-05 LAB — URINALYSIS, ROUTINE W REFLEX MICROSCOPIC
Bacteria, UA: NONE SEEN
Bilirubin Urine: NEGATIVE
Glucose, UA: NEGATIVE mg/dL
Ketones, ur: 5 mg/dL — AB
Leukocytes, UA: NEGATIVE
Nitrite: NEGATIVE
Protein, ur: NEGATIVE mg/dL
Specific Gravity, Urine: 1.013 (ref 1.005–1.030)
pH: 5 (ref 5.0–8.0)

## 2018-09-05 LAB — PROTIME-INR
INR: 1.03
Prothrombin Time: 13.4 seconds (ref 11.4–15.2)

## 2018-09-05 NOTE — H&P (Signed)
TOTAL KNEE ADMISSION H&P  Patient is being admitted for right total knee arthroplasty.  Subjective:  Chief Complaint:right knee pain.  HPI: Jennifer [name], 72 y.o. female, has a history of pain and functional disability in the right knee due to arthritis and has failed non-surgical conservative treatments for greater than 12 weeks to includeNSAID's and/or analgesics, corticosteriod injections, viscosupplementation injections, flexibility and strengthening excercises, weight reduction as appropriate and activity modification.  Onset of symptoms was gradual, starting 8 years ago with gradually worsening course since that time. The patient noted no past surgery on the right knee(s).  Patient currently rates pain in the right knee(s) at 8 out of 10 with activity. Patient has night pain, worsening of pain with activity and weight bearing, pain that interferes with activities of daily living, pain with passive range of motion, crepitus and joint swelling.  Patient has evidence of subchondral cysts, subchondral sclerosis, periarticular osteophytes and joint space narrowing by imaging studies. There is no active infection.  Patient Active Problem List   Diagnosis Date Noted  . Primary osteoarthritis of both knees 11/30/2017  . Primary osteoarthritis of both feet 11/30/2017  . History of rotator cuff tear repair, bilateral 11/30/2017  . Vitamin D deficiency 12/27/2015  . 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 Hypergammaglobulinemia   . IgG monoclonal gammopathy of uncertain significance   . Abnormal gamma globulin level 04/14/2015  . Hyperlipidemia 04/03/2015  . Hyperglycemia 04/03/2015  . Diverticulosis of colon without hemorrhage 04/03/2015  . Arthralgia of multiple joints 04/03/2015  . Primary osteoarthritis of both hands 01/20/2010  . SKIN CANCER, HX OF 01/20/2010   Past Medical History:  Diagnosis Date  . Atrial fibrillation ([place]) 12/2010   PMH of ; [place] , [place] [facility]  . Blood  transfusion during current hospitalization 3086   complication after childbirth  . Cancer ([place]) 2000   SKIN CANCER IN [place]AL[place]  . DJD (degenerative joint disease)   . Skin cancer of face 2015   Patient reports this was likely basal cell on the right side of her face needing 14 stitches.  . Smoldering multiple myeloma ([place]) 2016    Past Surgical History:  Procedure Laterality Date  . CHOLECYSTECTOMY  1990's  . COLONOSCOPY  02/2013   negative X 3; Dr Olevia Perches  . G 3 P 1    . SHOULDER SURGERY Right 2012   R ; Dr Durward Fortes for rotator tear  . SHOULDER SURGERY Left 06/2014   Dr. Sydnee Cabal for rotator cuff  . T&A  child  . WISDOM TOOTH EXTRACTION  teenager    No current facility-administered medications for this encounter.    Current Outpatient Medications  Medication Sig Dispense Refill Last Dose  . Ascorbic Acid (VITAMIN C PO) Take 1 tablet by mouth daily.     Marland Kitchen ibuprofen (ADVIL,MOTRIN) 200 MG tablet Take 200 mg by mouth 2 (two) times daily as needed for headache or moderate pain.    Taking  . Menthol-Methyl Salicylate (MUSCLE RUB) 10-15 % CREA Apply 1 application topically as needed for muscle pain.     . naproxen sodium (ALEVE) 220 MG tablet Take 220 mg by mouth 2 (two) times daily as needed (pain).     . TURMERIC PO Take by mouth daily.   Taking   No Known Allergies  Social History   Tobacco Use  . Smoking status: Former Smoker    Packs/day: 0.25    Years: 3.00    Pack years: 0.75    Last attempt  to quit: 11/07/1970    Years since quitting: 47.8  . Smokeless tobacco: Never Used  . Tobacco comment: 1/4 ppd [id number]  Substance Use Topics  . Alcohol use: Yes    Alcohol/week: 10.0 standard drinks    Types: 10 Glasses of wine per week    Family History  Problem Relation Age of Onset  . Atrial fibrillation Father   . Asthma Brother   . Alzheimer's disease Mother   . Myasthenia gravis Mother        ocular  . Breast cancer Paternal Grandmother   . Depression Daughter   . Colon  cancer Neg Hx   . Rectal cancer Neg Hx   . Stomach cancer Neg Hx   . Diabetes Neg Hx   . Stroke Neg Hx   . Heart attack Neg Hx      Review of Systems  Constitutional: Negative.     Review of Systems  Constitutional: Negative for fatigue.  HENT: Negative for trouble swallowing.   Eyes: Negative for pain.  Respiratory: Negative for shortness of breath.   Cardiovascular: Negative for leg swelling.  Gastrointestinal: Negative for constipation.  Endocrine: Negative for cold intolerance.  Genitourinary: Negative for difficulty urinating.  Musculoskeletal: Positive for joint swelling.  Skin: Negative for rash.  Allergic/Immunologic: Negative for food allergies.  Neurological: Negative for weakness.  Hematological: Does not bruise/bleed easily.  Psychiatric/Behavioral: Negative for sleep disturbance.   Objective:  Physical Exam  Constitutional: She is oriented to person, place, and time. She appears well-developed and well-nourished.  HENT:  Head: Normocephalic and atraumatic.  Eyes: Pupils are equal, round, and reactive to light. Conjunctivae and EOM are normal.  Neck: Neck supple. No thyromegaly present.  Cardiovascular: Normal rate, regular rhythm, normal heart sounds and intact distal pulses.  No murmur heard. Respiratory: Effort normal and breath sounds normal. She has no wheezes.  GI: Bowel sounds are normal. There is no tenderness.  Neurological: She is alert and oriented to person, place, and time. She has normal reflexes.  Skin: Skin is warm and dry.  Psychiatric: She has a normal mood and affect. Her behavior is normal. Judgment and thought content normal.  Musculoskeletal: She has a trace effusion.  Range of motion she lacks about 3 to 5 degrees of full extension flexes to about 105 to 110 degrees.  Crepitance with range of motion.  She has had tenderness more medial than lateral at the joint line.  Vital signs in last 24 hours:    BP 123/74 (BP Location: Left Arm,  Patient Position: Sitting, Cuff Size: Normal)   Pulse 61   Temp 97.7 F (36.5 C)   Resp 14   Ht 5' 6.5" (1.689 m)   Wt 134 lb 8 oz (61 kg)   LMP 11/07/1996   BMI 21.38 kg/m   Labs:   Estimated body mass index is 22.44 kg/m as calculated from the following:   Height as of 07/04/18: _0  (1.676 m).   Weight as of 07/04/18: 63 kg.   Imaging Review Plain radiographs demonstrate moderate degenerative joint disease of the right knee(s). The overall alignment isneutral. The bone quality appears to be good for age and reported activity level.   Preoperative templating of the joint replacement has been completed, documented, and submitted to the Operating Room personnel in order to optimize intra-operative equipment management.   Anticipated LOS equal to or greater than 2 midnights due to - Age 20 and older with one or more of the following:  -  Obesity  - Expected need for hospital services (PT, OT, Nursing) required for safe  discharge  - Anticipated need for postoperative skilled nursing care or inpatient rehab  - Active co-morbidities: None OR   - Unanticipated findings during/Post Surgery: None  - Patient is a high risk of re-admission due to: None     Assessment/Plan:  End stage arthritis, right knee   The patient history, physical examination, clinical judgment of the provider and imaging studies are consistent with end stage degenerative joint disease of the right knee(s) and total knee arthroplasty is deemed medically necessary. The treatment options including medical management, injection therapy arthroscopy and arthroplasty were discussed at length. The risks and benefits of total knee arthroplasty were presented and reviewed. The risks due to aseptic loosening, infection, stiffness, patella tracking problems, thromboembolic complications and other imponderables were discussed. The patient acknowledged the explanation, agreed to proceed with the plan and consent was  signed. Patient is being admitted for inpatient treatment for surgery, pain control, PT, OT, prophylactic antibiotics, VTE prophylaxis, progressive ambulation and ADL's and discharge planning. The patient is planning to be discharged home with home health services   Mike Craze. [place], [place] [phone]  09/05/2018 12:14 PM

## 2018-09-05 NOTE — Progress Notes (Signed)
Office Visit Note   Patient: Jennifer [name]           Date of Birth: January 21, 1946           MRN: [id number] Visit Date: 09/05/2018              Requested by: [name], MD No address on file PCP: [name], MD  Chief Complaint:right knee pain.  HPI: Jennifer [name], 72 y.o. female, has a history of pain and functional disability in the right knee due to arthritis and has failed non-surgical conservative treatments for greater than 12 weeks to includeNSAID's and/or analgesics, corticosteriod injections, viscosupplementation injections, flexibility and strengthening excercises, weight reduction as appropriate and activity modification.  Onset of symptoms was gradual, starting 8 years ago with gradually worsening course since that time. The patient noted no past surgery on the right knee(s).  Patient currently rates pain in the right knee(s) at 8 out of 10 with activity. Patient has night pain, worsening of pain with activity and weight bearing, pain that interferes with activities of daily living, pain with passive range of motion, crepitus and joint swelling.  Patient has evidence of subchondral cysts, subchondral sclerosis, periarticular osteophytes and joint space narrowing by imaging studies. There is no active infection.  Patient Active Problem List   Diagnosis Date Noted  . Primary osteoarthritis of both knees 11/30/2017  . Primary osteoarthritis of both feet 11/30/2017  . History of rotator cuff tear repair, bilateral 11/30/2017  . Vitamin D deficiency 12/27/2015  . Smoldering multiple myeloma ([place]) 12/22/2015  . Hypergammaglobulinemia   . IgG monoclonal gammopathy of uncertain significance   . Abnormal gamma globulin level 04/14/2015  . Hyperlipidemia 04/03/2015  . Hyperglycemia 04/03/2015  . Diverticulosis of colon without hemorrhage 04/03/2015  . Arthralgia of multiple joints 04/03/2015  . Primary osteoarthritis of both hands 01/20/2010  . SKIN CANCER, HX OF 01/20/2010     Past Medical History:  Diagnosis Date  . Atrial fibrillation ([place]) 12/2010   PMH of ; [place] , [place] [facility]  . Blood transfusion during current hospitalization 7741   complication after childbirth  . Cancer ([place]) 2000   SKIN CANCER IN [place]  . DJD (degenerative joint disease)   . Skin cancer of face 2015   Patient reports this was likely basal cell on the right side of her face needing 14 stitches.  . Smoldering multiple myeloma ([place]) 2016    Past Surgical History:  Procedure Laterality Date  . CHOLECYSTECTOMY  1990's  . COLONOSCOPY  02/2013   negative X 3; Dr Olevia Perches  . G 3 P 1    . SHOULDER SURGERY Right 2012   R ; Dr Durward Fortes for rotator tear  . SHOULDER SURGERY Left 06/2014   Dr. Sydnee Cabal for rotator cuff  . T&A  child  . WISDOM TOOTH EXTRACTION  teenager    No current facility-administered medications for this encounter.    Current Outpatient Medications  Medication Sig Dispense Refill Last Dose  . Ascorbic Acid (VITAMIN C PO) Take 1 tablet by mouth daily.     Marland Kitchen ibuprofen (ADVIL,MOTRIN) 200 MG tablet Take 200 mg by mouth 2 (two) times daily as needed for headache or moderate pain.    Taking  . Menthol-Methyl Salicylate (MUSCLE RUB) 10-15 % CREA Apply 1 application topically as needed for muscle pain.     . naproxen sodium (ALEVE) 220 MG tablet Take 220 mg by mouth 2 (two) times daily as needed (pain).     Marland Kitchen  TURMERIC PO Take by mouth daily.   Taking   No Known Allergies  Social History   Tobacco Use  . Smoking status: Former Smoker    Packs/day: 0.25    Years: 3.00    Pack years: 0.75    Last attempt to quit: 11/07/1970    Years since quitting: 47.8  . Smokeless tobacco: Never Used  . Tobacco comment: 1/4 ppd 8115-7262  Substance Use Topics  . Alcohol use: Yes    Alcohol/week: 10.0 standard drinks    Types: 10 Glasses of wine per week    Family History  Problem Relation Age of Onset  . Atrial fibrillation Father   . Asthma Brother   . Alzheimer's disease Mother    . Myasthenia gravis Mother        ocular  . Breast cancer Paternal Grandmother   . Depression Daughter   . Colon cancer Neg Hx   . Rectal cancer Neg Hx   . Stomach cancer Neg Hx   . Diabetes Neg Hx   . Stroke Neg Hx   . Heart attack Neg Hx      Review of Systems  Constitutional: Negative.     Review of Systems  Constitutional: Negative for fatigue.  HENT: Negative for trouble swallowing.   Eyes: Negative for pain.  Respiratory: Negative for shortness of breath.   Cardiovascular: Negative for leg swelling.  Gastrointestinal: Negative for constipation.  Endocrine: Negative for cold intolerance.  Genitourinary: Negative for difficulty urinating.  Musculoskeletal: Positive for joint swelling.  Skin: Negative for rash.  Allergic/Immunologic: Negative for food allergies.  Neurological: Negative for weakness.  Hematological: Does not bruise/bleed easily.  Psychiatric/Behavioral: Negative for sleep disturbance.   Objective:  Physical Exam  Constitutional: She is oriented to person, place, and time. She appears well-developed and well-nourished.  HENT:  Head: Normocephalic and atraumatic.  Eyes: Pupils are equal, round, and reactive to light. Conjunctivae and EOM are normal.  Neck: Neck supple. No thyromegaly present.  Cardiovascular: Normal rate, regular rhythm, normal heart sounds and intact distal pulses.  No murmur heard. Respiratory: Effort normal and breath sounds normal. She has no wheezes.  GI: Bowel sounds are normal. There is no tenderness.  Neurological: She is alert and oriented to person, place, and time. She has normal reflexes.  Skin: Skin is warm and dry.  Psychiatric: She has a normal mood and affect. Her behavior is normal. Judgment and thought content normal.  Musculoskeletal: She has a trace effusion.  Range of motion she lacks about 3 to 5 degrees of full extension flexes to about 105 to 110 degrees.  Crepitance with range of motion.  She has had  tenderness more medial than lateral at the joint line.  Vital signs in last 24 hours:    BP 123/74 (BP Location: Left Arm, Patient Position: Sitting, Cuff Size: Normal)   Pulse 61   Temp 97.7 F (36.5 C)   Resp 14   Ht 5' 6.5" (1.689 m)   Wt 134 lb 8 oz (61 kg)   LMP 11/07/1996   BMI 21.38 kg/m   Labs:   Estimated body mass index is 22.44 kg/m as calculated from the following:   Height as of 07/04/18: '5\' 6"'$  (1.676 m).   Weight as of 07/04/18: 63 kg.   Imaging Review Plain radiographs demonstrate moderate degenerative joint disease of the right knee(s). The overall alignment isneutral. The bone quality appears to be good for age and reported activity level.   Preoperative templating  of the joint replacement has been completed, documented, and submitted to the Operating Room personnel in order to optimize intra-operative equipment management.   Anticipated LOS equal to or greater than 2 midnights due to - Age 55 and older with one or more of the following:  - Obesity  - Expected need for hospital services (PT, OT, Nursing) required for safe  discharge  - Anticipated need for postoperative skilled nursing care or inpatient rehab  - Active co-morbidities: None OR   - Unanticipated findings during/Post Surgery: None  - Patient is a high risk of re-admission due to: None     Assessment/Plan:  End stage arthritis, right knee   The patient history, physical examination, clinical judgment of the provider and imaging studies are consistent with end stage degenerative joint disease of the right knee(s) and total knee arthroplasty is deemed medically necessary. The treatment options including medical management, injection therapy arthroscopy and arthroplasty were discussed at length. The risks and benefits of total knee arthroplasty were presented and reviewed. The risks due to aseptic loosening, infection, stiffness, patella tracking problems, thromboembolic complications and  other imponderables were discussed. The patient acknowledged the explanation, agreed to proceed with the plan and consent was signed. Patient is being admitted for inpatient treatment for surgery, pain control, PT, OT, prophylactic antibiotics, VTE prophylaxis, progressive ambulation and ADL's and discharge planning. The patient is planning to be discharged home with home health services   Mike Craze. [place], [place] [phone]  09/05/2018 12:14 PM

## 2018-09-06 ENCOUNTER — Telehealth (INDEPENDENT_AMBULATORY_CARE_PROVIDER_SITE_OTHER): Payer: Self-pay | Admitting: Orthopedic Surgery

## 2018-09-06 ENCOUNTER — Other Ambulatory Visit (INDEPENDENT_AMBULATORY_CARE_PROVIDER_SITE_OTHER): Payer: Self-pay | Admitting: Orthopedic Surgery

## 2018-09-06 LAB — URINE CULTURE: Culture: 40000 — AB

## 2018-09-06 MED ORDER — AMOXICILLIN 500 MG PO CAPS: 500.0000 mg | ORAL_CAPSULE | Freq: Three times a day (TID) | ORAL | 0 refills | Status: DC

## 2018-09-06 NOTE — Telephone Encounter (Signed)
Spoke to Spiro on the phone she does have Corynebacterium in her urine and we are going to use amoxicillin for treatment.

## 2018-09-07 ENCOUNTER — Other Ambulatory Visit (HOSPITAL_COMMUNITY): Payer: Medicare Other

## 2018-09-07 DIAGNOSIS — R112 Nausea with vomiting, unspecified: Secondary | ICD-10-CM

## 2018-09-07 DIAGNOSIS — Z9889 Other specified postprocedural states: Secondary | ICD-10-CM

## 2018-09-07 HISTORY — DX: Other specified postprocedural states: R11.2

## 2018-09-07 HISTORY — DX: Other specified postprocedural states: Z98.890

## 2018-09-10 ENCOUNTER — Encounter (INDEPENDENT_AMBULATORY_CARE_PROVIDER_SITE_OTHER): Payer: Self-pay | Admitting: Orthopaedic Surgery

## 2018-09-10 DIAGNOSIS — M9902 Segmental and somatic dysfunction of thoracic region: Secondary | ICD-10-CM | POA: Diagnosis not present

## 2018-09-10 DIAGNOSIS — M461 Sacroiliitis, not elsewhere classified: Secondary | ICD-10-CM | POA: Diagnosis not present

## 2018-09-10 DIAGNOSIS — M9903 Segmental and somatic dysfunction of lumbar region: Secondary | ICD-10-CM | POA: Diagnosis not present

## 2018-09-10 DIAGNOSIS — M9905 Segmental and somatic dysfunction of pelvic region: Secondary | ICD-10-CM | POA: Diagnosis not present

## 2018-09-12 ENCOUNTER — Ambulatory Visit (INDEPENDENT_AMBULATORY_CARE_PROVIDER_SITE_OTHER): Payer: Medicare Other | Admitting: Orthopedic Surgery

## 2018-09-14 NOTE — Progress Notes (Signed)
Sent message to Biagio Borg to follow up on abnormal UA and Culture.

## 2018-09-17 MED ORDER — ACETAMINOPHEN 10 MG/ML IV SOLN: 1000.0000 mg | Freq: Once | INTRAVENOUS | Status: DC

## 2018-09-17 MED ORDER — TRANEXAMIC ACID 1000 MG/10ML IV SOLN
2000.0000 mg | INTRAVENOUS | Status: AC
  Administered 2018-09-18: 2000 mg via TOPICAL
  Filled 2018-09-17: qty 20

## 2018-09-17 NOTE — Anesthesia Preprocedure Evaluation (Addendum)
Anesthesia Evaluation  Patient identified by MRN, date of birth, ID band Patient awake    Reviewed: Allergy & Precautions, NPO status , Patient's Chart, lab work & pertinent test results  Airway Mallampati: I  TM Distance: >3 FB Neck ROM: Full    Dental no notable dental hx. (+) Teeth Intact, Dental Advisory Given   Pulmonary neg pulmonary ROS, former smoker,    Pulmonary exam normal breath sounds clear to auscultation       Cardiovascular negative cardio ROS Normal cardiovascular exam Rhythm:Regular Rate:Normal     Neuro/Psych negative neurological ROS  negative psych ROS   GI/Hepatic negative GI ROS, Neg liver ROS,   Endo/Other  negative endocrine ROS  Renal/GU negative Renal ROS  negative genitourinary   Musculoskeletal  (+) Arthritis , Osteoarthritis,    Abdominal   Peds  Hematology negative hematology ROS (+)   Anesthesia Other Findings   Reproductive/Obstetrics                            Anesthesia Physical Anesthesia Plan  ASA: II  Anesthesia Plan: Spinal and Regional   Post-op Pain Management:  Regional for Post-op pain   Induction:   PONV Risk Score and Plan: 2 and Treatment may vary due to age or medical condition, Dexamethasone, Ondansetron and Midazolam  Airway Management Planned: Natural Airway and Simple Face Mask  Additional Equipment:   Intra-op Plan:   Post-operative Plan:   Informed Consent: I have reviewed the patients History and Physical, chart, labs and discussed the procedure including the risks, benefits and alternatives for the proposed anesthesia with the patient or authorized representative who has indicated his/her understanding and acceptance.   Dental advisory given  Plan Discussed with: CRNA  Anesthesia Plan Comments:         Anesthesia Quick Evaluation

## 2018-09-18 ENCOUNTER — Inpatient Hospital Stay (HOSPITAL_COMMUNITY): Payer: Medicare Other | Admitting: Certified Registered Nurse Anesthetist

## 2018-09-18 ENCOUNTER — Other Ambulatory Visit: Payer: Self-pay

## 2018-09-18 ENCOUNTER — Encounter (HOSPITAL_COMMUNITY): Payer: Self-pay

## 2018-09-18 ENCOUNTER — Observation Stay (HOSPITAL_COMMUNITY)
Admission: RE | Admit: 2018-09-18 | Discharge: 2018-09-19 | Disposition: A | Discharge status: Home Health | Payer: Medicare Other | Source: Ambulatory Visit | Attending: Orthopaedic Surgery | Admitting: Orthopaedic Surgery

## 2018-09-18 ENCOUNTER — Encounter (HOSPITAL_COMMUNITY)
Admission: RE | Disposition: A | Discharge status: Home Health | Payer: Self-pay | Source: Ambulatory Visit | Attending: Orthopaedic Surgery

## 2018-09-18 DIAGNOSIS — Z85828 Personal history of other malignant neoplasm of skin: Secondary | ICD-10-CM | POA: Insufficient documentation

## 2018-09-18 DIAGNOSIS — Z87891 Personal history of nicotine dependence: Secondary | ICD-10-CM | POA: Diagnosis not present

## 2018-09-18 DIAGNOSIS — M1711 Unilateral primary osteoarthritis, right knee: Principal | ICD-10-CM | POA: Diagnosis present

## 2018-09-18 DIAGNOSIS — G8918 Other acute postprocedural pain: Secondary | ICD-10-CM | POA: Diagnosis not present

## 2018-09-18 HISTORY — PX: TOTAL KNEE ARTHROPLASTY: SHX125

## 2018-09-18 HISTORY — DX: Squamous cell carcinoma of skin of scalp and neck: C44.42

## 2018-09-18 HISTORY — DX: Unspecified osteoarthritis, unspecified site: M19.90

## 2018-09-18 HISTORY — DX: Personal history of other medical treatment: Z92.89

## 2018-09-18 SURGERY — ARTHROPLASTY, KNEE, TOTAL
Anesthesia: Regional | Laterality: Right

## 2018-09-18 MED ORDER — ONDANSETRON HCL 4 MG/2ML IJ SOLN
INTRAMUSCULAR | Status: AC
  Filled 2018-09-18: qty 2

## 2018-09-18 MED ORDER — BISACODYL 10 MG RE SUPP: 10.0000 mg | Freq: Every day | RECTAL | Status: DC | PRN

## 2018-09-18 MED ORDER — METHOCARBAMOL 500 MG PO TABS
500.0000 mg | ORAL_TABLET | Freq: Four times a day (QID) | ORAL | Status: DC | PRN
  Administered 2018-09-18 – 2018-09-19 (×3): 500 mg via ORAL
  Filled 2018-09-18 (×4): qty 1

## 2018-09-18 MED ORDER — SODIUM CHLORIDE 0.9 % IV SOLN
75.0000 mL/h | INTRAVENOUS | Status: DC
  Administered 2018-09-18: 75 mL/h via INTRAVENOUS

## 2018-09-18 MED ORDER — BUPIVACAINE-EPINEPHRINE (PF) 0.25% -1:200000 IJ SOLN
INTRAMUSCULAR | Status: AC
  Filled 2018-09-18: qty 30

## 2018-09-18 MED ORDER — OXYCODONE HCL 5 MG PO TABS
5.0000 mg | ORAL_TABLET | ORAL | Status: DC | PRN
  Administered 2018-09-18: 5 mg via ORAL
  Administered 2018-09-18 – 2018-09-19 (×4): 10 mg via ORAL
  Filled 2018-09-18 (×4): qty 2
  Filled 2018-09-18: qty 1

## 2018-09-18 MED ORDER — HYDROMORPHONE HCL 1 MG/ML IJ SOLN
0.5000 mg | INTRAMUSCULAR | Status: DC | PRN
  Administered 2018-09-18 – 2018-09-19 (×2): 1 mg via INTRAVENOUS
  Filled 2018-09-18 (×2): qty 1

## 2018-09-18 MED ORDER — BUPIVACAINE-EPINEPHRINE 0.25% -1:200000 IJ SOLN
INTRAMUSCULAR | Status: DC | PRN
  Administered 2018-09-18: 30 mL

## 2018-09-18 MED ORDER — METOCLOPRAMIDE HCL 5 MG/ML IJ SOLN: 5.0000 mg | Freq: Three times a day (TID) | INTRAMUSCULAR | Status: DC | PRN

## 2018-09-18 MED ORDER — CEFAZOLIN SODIUM-DEXTROSE 2-4 GM/100ML-% IV SOLN
2.0000 g | Freq: Four times a day (QID) | INTRAVENOUS | Status: AC
  Administered 2018-09-18: 2 g via INTRAVENOUS
  Filled 2018-09-18 (×2): qty 100

## 2018-09-18 MED ORDER — SODIUM CHLORIDE 0.9 % IV SOLN: INTRAVENOUS | Status: DC

## 2018-09-18 MED ORDER — METHOCARBAMOL 1000 MG/10ML IJ SOLN
500.0000 mg | Freq: Four times a day (QID) | INTRAVENOUS | Status: DC | PRN
  Filled 2018-09-18: qty 5

## 2018-09-18 MED ORDER — PROPOFOL 10 MG/ML IV BOLUS
INTRAVENOUS | Status: AC
  Filled 2018-09-18: qty 20

## 2018-09-18 MED ORDER — PROPOFOL 500 MG/50ML IV EMUL
INTRAVENOUS | Status: DC | PRN
  Administered 2018-09-18: 50 ug/kg/min via INTRAVENOUS

## 2018-09-18 MED ORDER — FENTANYL CITRATE (PF) 250 MCG/5ML IJ SOLN
INTRAMUSCULAR | Status: AC
  Filled 2018-09-18: qty 5

## 2018-09-18 MED ORDER — DIPHENHYDRAMINE HCL 12.5 MG/5ML PO ELIX: 12.5000 mg | ORAL_SOLUTION | ORAL | Status: DC | PRN

## 2018-09-18 MED ORDER — LIDOCAINE 2% (20 MG/ML) 5 ML SYRINGE
INTRAMUSCULAR | Status: AC
  Filled 2018-09-18: qty 5

## 2018-09-18 MED ORDER — CHLORHEXIDINE GLUCONATE 4 % EX LIQD: 60.0000 mL | Freq: Once | CUTANEOUS | Status: DC

## 2018-09-18 MED ORDER — CEFAZOLIN SODIUM-DEXTROSE 2-4 GM/100ML-% IV SOLN
2.0000 g | INTRAVENOUS | Status: AC
  Administered 2018-09-18: 2 g via INTRAVENOUS
  Filled 2018-09-18: qty 100

## 2018-09-18 MED ORDER — ASPIRIN 81 MG PO CHEW
81.0000 mg | CHEWABLE_TABLET | Freq: Two times a day (BID) | ORAL | Status: DC
  Administered 2018-09-18 – 2018-09-19 (×2): 81 mg via ORAL
  Filled 2018-09-18 (×2): qty 1

## 2018-09-18 MED ORDER — ACETAMINOPHEN 10 MG/ML IV SOLN
1000.0000 mg | Freq: Four times a day (QID) | INTRAVENOUS | Status: AC
  Administered 2018-09-18 – 2018-09-19 (×4): 1000 mg via INTRAVENOUS
  Filled 2018-09-18 (×4): qty 100

## 2018-09-18 MED ORDER — ALUM & MAG HYDROXIDE-SIMETH 200-200-20 MG/5ML PO SUSP: 30.0000 mL | ORAL | Status: DC | PRN

## 2018-09-18 MED ORDER — MAGNESIUM CITRATE PO SOLN: 1.0000 | Freq: Once | ORAL | Status: DC | PRN

## 2018-09-18 MED ORDER — 0.9 % SODIUM CHLORIDE (POUR BTL) OPTIME
TOPICAL | Status: DC | PRN
  Administered 2018-09-18: 1000 mL

## 2018-09-18 MED ORDER — METOCLOPRAMIDE HCL 5 MG PO TABS: 5.0000 mg | ORAL_TABLET | Freq: Three times a day (TID) | ORAL | Status: DC | PRN

## 2018-09-18 MED ORDER — ONDANSETRON HCL 4 MG/2ML IJ SOLN
4.0000 mg | Freq: Four times a day (QID) | INTRAMUSCULAR | Status: DC | PRN
  Administered 2018-09-19: 4 mg via INTRAVENOUS
  Filled 2018-09-18: qty 2

## 2018-09-18 MED ORDER — MIDAZOLAM HCL 5 MG/5ML IJ SOLN
INTRAMUSCULAR | Status: DC | PRN
  Administered 2018-09-18 (×2): 1 mg via INTRAVENOUS

## 2018-09-18 MED ORDER — MAGNESIUM HYDROXIDE 400 MG/5ML PO SUSP: 30.0000 mL | Freq: Every day | ORAL | Status: DC | PRN

## 2018-09-18 MED ORDER — MIDAZOLAM HCL 2 MG/2ML IJ SOLN
INTRAMUSCULAR | Status: AC
  Filled 2018-09-18: qty 2

## 2018-09-18 MED ORDER — GLYCOPYRROLATE PF 0.2 MG/ML IJ SOSY
PREFILLED_SYRINGE | INTRAMUSCULAR | Status: AC
  Filled 2018-09-18: qty 1

## 2018-09-18 MED ORDER — MENTHOL 3 MG MT LOZG: 1.0000 | LOZENGE | OROMUCOSAL | Status: DC | PRN

## 2018-09-18 MED ORDER — KETOROLAC TROMETHAMINE 15 MG/ML IJ SOLN
7.5000 mg | Freq: Four times a day (QID) | INTRAMUSCULAR | Status: AC
  Administered 2018-09-18 – 2018-09-19 (×4): 7.5 mg via INTRAVENOUS
  Filled 2018-09-18 (×4): qty 1

## 2018-09-18 MED ORDER — ONDANSETRON HCL 4 MG/2ML IJ SOLN
INTRAMUSCULAR | Status: DC | PRN
  Administered 2018-09-18: 4 mg via INTRAVENOUS

## 2018-09-18 MED ORDER — FENTANYL CITRATE (PF) 100 MCG/2ML IJ SOLN
INTRAMUSCULAR | Status: DC | PRN
  Administered 2018-09-18: 50 ug via INTRAVENOUS

## 2018-09-18 MED ORDER — PHENOL 1.4 % MT LIQD: 1.0000 | OROMUCOSAL | Status: DC | PRN

## 2018-09-18 MED ORDER — SODIUM CHLORIDE 0.9 % IR SOLN
Status: DC | PRN
  Administered 2018-09-18: 3000 mL

## 2018-09-18 MED ORDER — DOCUSATE SODIUM 100 MG PO CAPS
100.0000 mg | ORAL_CAPSULE | Freq: Two times a day (BID) | ORAL | Status: DC
  Administered 2018-09-18 – 2018-09-19 (×2): 100 mg via ORAL
  Filled 2018-09-18 (×2): qty 1

## 2018-09-18 MED ORDER — ONDANSETRON HCL 4 MG PO TABS: 4.0000 mg | ORAL_TABLET | Freq: Four times a day (QID) | ORAL | Status: DC | PRN

## 2018-09-18 MED ORDER — GLYCOPYRROLATE PF 0.2 MG/ML IJ SOSY
PREFILLED_SYRINGE | INTRAMUSCULAR | Status: DC | PRN
  Administered 2018-09-18: .2 mg via INTRAVENOUS

## 2018-09-18 MED ORDER — FENTANYL CITRATE (PF) 100 MCG/2ML IJ SOLN: 25.0000 ug | INTRAMUSCULAR | Status: DC | PRN

## 2018-09-18 MED ORDER — SODIUM CHLORIDE 0.9 % IV SOLN
INTRAVENOUS | Status: DC | PRN
  Administered 2018-09-18: 25 ug/min via INTRAVENOUS

## 2018-09-18 MED ORDER — ACETAMINOPHEN 325 MG PO TABS: 325.0000 mg | ORAL_TABLET | Freq: Four times a day (QID) | ORAL | Status: DC | PRN

## 2018-09-18 MED ORDER — LACTATED RINGERS IV SOLN
INTRAVENOUS | Status: DC | PRN
  Administered 2018-09-18 (×2): via INTRAVENOUS

## 2018-09-18 SURGICAL SUPPLY — 67 items
BAG DECANTER FOR FLEXI CONT (MISCELLANEOUS) ×2 IMPLANT
BANDAGE ELASTIC 6 VELCRO ST LF (GAUZE/BANDAGES/DRESSINGS) ×2 IMPLANT
BANDAGE ESMARK 6X9 LF (GAUZE/BANDAGES/DRESSINGS) ×1 IMPLANT
BLADE SAGITTAL 25.0X1.19X90 (BLADE) ×2 IMPLANT
BNDG ESMARK 6X9 LF (GAUZE/BANDAGES/DRESSINGS) ×2
BOWL SMART MIX CTS (DISPOSABLE) ×2 IMPLANT
CEMENT HV SMART SET (Cement) ×4 IMPLANT
CEMENT TIBIA MBT SIZE 4 (Knees) ×1 IMPLANT
COMP FEM CEM STD RT LCS (Orthopedic Implant) ×2 IMPLANT
COMP PATELLA PEGX3 CEM STAN (Knees) ×2 IMPLANT
COMPONENT FEM CEM STD RT LCS (Orthopedic Implant) ×1 IMPLANT
COMPONENT PTLLA PEGX3 CEM STAN (Knees) ×1 IMPLANT
COVER SURGICAL LIGHT HANDLE (MISCELLANEOUS) ×2 IMPLANT
COVER WAND RF STERILE (DRAPES) ×2 IMPLANT
CUFF TOURNIQUET SINGLE 34IN LL (TOURNIQUET CUFF) ×2 IMPLANT
CUFF TOURNIQUET SINGLE 44IN (TOURNIQUET CUFF) IMPLANT
DRAPE EXTREMITY T 121X128X90 (DRAPE) ×2 IMPLANT
DRAPE HALF SHEET 40X57 (DRAPES) ×4 IMPLANT
DRSG ADAPTIC 3X8 NADH LF (GAUZE/BANDAGES/DRESSINGS) ×2 IMPLANT
DRSG PAD ABDOMINAL 8X10 ST (GAUZE/BANDAGES/DRESSINGS) ×4 IMPLANT
DURAPREP 26ML APPLICATOR (WOUND CARE) ×4 IMPLANT
ELECT CAUTERY BLADE 6.4 (BLADE) ×2 IMPLANT
ELECT REM PT RETURN 9FT ADLT (ELECTROSURGICAL) ×2
ELECTRODE REM PT RTRN 9FT ADLT (ELECTROSURGICAL) ×1 IMPLANT
EVACUATOR 1/8 PVC DRAIN (DRAIN) IMPLANT
FACESHIELD WRAPAROUND (MASK) ×4 IMPLANT
GAUZE SPONGE 4X4 12PLY STRL (GAUZE/BANDAGES/DRESSINGS) ×2 IMPLANT
GAUZE SPONGE 4X4 12PLY STRL LF (GAUZE/BANDAGES/DRESSINGS) ×2 IMPLANT
GAUZE XEROFORM 1X8 LF (GAUZE/BANDAGES/DRESSINGS) ×2 IMPLANT
GLOVE BIOGEL PI IND STRL 8 (GLOVE) ×1 IMPLANT
GLOVE BIOGEL PI IND STRL 8.5 (GLOVE) ×1 IMPLANT
GLOVE BIOGEL PI INDICATOR 8 (GLOVE) ×1
GLOVE BIOGEL PI INDICATOR 8.5 (GLOVE) ×1
GLOVE ECLIPSE 8.0 STRL XLNG CF (GLOVE) ×4 IMPLANT
GLOVE ECLIPSE 8.5 STRL (GLOVE) ×4 IMPLANT
GOWN STRL REUS W/ TWL LRG LVL3 (GOWN DISPOSABLE) ×2 IMPLANT
GOWN STRL REUS W/TWL 2XL LVL3 (GOWN DISPOSABLE) ×2 IMPLANT
GOWN STRL REUS W/TWL LRG LVL3 (GOWN DISPOSABLE) ×2
HANDPIECE INTERPULSE COAX TIP (DISPOSABLE) ×1
INSERT TIB LCS RP STD 12.5 (Knees) ×2 IMPLANT
KIT BASIN OR (CUSTOM PROCEDURE TRAY) ×2 IMPLANT
KIT TURNOVER KIT B (KITS) ×2 IMPLANT
MANIFOLD NEPTUNE II (INSTRUMENTS) ×2 IMPLANT
NEEDLE 22X1 1/2 (OR ONLY) (NEEDLE) ×4 IMPLANT
NS IRRIG 1000ML POUR BTL (IV SOLUTION) ×2 IMPLANT
PACK TOTAL JOINT (CUSTOM PROCEDURE TRAY) ×2 IMPLANT
PAD ARMBOARD 7.5X6 YLW CONV (MISCELLANEOUS) ×4 IMPLANT
PAD CAST 4YDX4 CTTN HI CHSV (CAST SUPPLIES) ×1 IMPLANT
PADDING CAST COTTON 4X4 STRL (CAST SUPPLIES) ×1
PADDING CAST COTTON 6X4 STRL (CAST SUPPLIES) ×2 IMPLANT
PADDING CAST SYN 6 (CAST SUPPLIES) ×1
PADDING CAST SYNTHETIC 6X4 NS (CAST SUPPLIES) ×1 IMPLANT
PIN STEINMAN FIXATION KNEE (PIN) ×2 IMPLANT
SET HNDPC FAN SPRY TIP SCT (DISPOSABLE) ×1 IMPLANT
STAPLER VISISTAT 35W (STAPLE) ×2 IMPLANT
SUCTION FRAZIER HANDLE 10FR (MISCELLANEOUS) ×1
SUCTION TUBE FRAZIER 10FR DISP (MISCELLANEOUS) ×1 IMPLANT
SUT BONE WAX W31G (SUTURE) ×2 IMPLANT
SUT ETHIBOND NAB CT1 #1 30IN (SUTURE) ×4 IMPLANT
SUT MNCRL AB 3-0 PS2 18 (SUTURE) ×2 IMPLANT
SUT VIC AB 0 CT1 27 (SUTURE) ×1
SUT VIC AB 0 CT1 27XBRD ANBCTR (SUTURE) ×1 IMPLANT
SYR CONTROL 10ML LL (SYRINGE) IMPLANT
TIBIA MBT CEMENT SIZE 4 (Knees) ×2 IMPLANT
TRAY FOLEY BAG SILVER LF 14FR (CATHETERS) ×2 IMPLANT
TRAY URETHRAL FOLEY CATH 14FR (CATHETERS) ×2 IMPLANT
WRAP KNEE MAXI GEL POST OP (GAUZE/BANDAGES/DRESSINGS) ×2 IMPLANT

## 2018-09-18 NOTE — Progress Notes (Signed)
Orthopedic Tech Progress Note Patient Details:  IVER FEHRENBACH 1946/04/02 [id number]  CPM Right Knee CPM Right Knee: On Right Knee Flexion (Degrees): 90 Right Knee Extension (Degrees): 0 Additional Comments: no overhead frames at this time  Post Interventions Patient Tolerated: Well Instructions Provided: Adjustment of device, Care of device     Post Interventions Patient Tolerated: Well Instructions Provided: Adjustment of device, Care of device   Braulio Bosch 09/18/2018, 10:59 AM

## 2018-09-18 NOTE — Progress Notes (Signed)
PATIENT ID:      Jennifer [name]  MRN:     [id number] DOB/AGE:    12-21-1945 / 72 y.o.       OPERATIVE REPORT    DATE OF PROCEDURE:  09/18/2018       PREOPERATIVE DIAGNOSIS: end stage  right knee osteoarthritis                                                       Estimated body mass index is 22.27 kg/m as calculated from the following:   Height as of this encounter: 5\' 6"  (1.676 m).   Weight as of this encounter: 62.6 kg.     POSTOPERATIVE DIAGNOSIS: end stage  right knee osteoarthritis                                                                     Estimated body mass index is 22.27 kg/m as calculated from the following:   Height as of this encounter: 5\' 6"  (1.676 m).   Weight as of this encounter: 62.6 kg.     PROCEDURE:  Procedure(s): RIGHT TOTAL KNEE ARTHROPLASTY      SURGEON:  Joni Fears, MD    ASSISTANT:   Biagio Borg, PA-C   (Present and scrubbed throughout the case, critical for assistance with exposure, retraction, instrumentation, and closure.)          ANESTHESIA: regional, spinal and IV sedation     DRAINS: none :      TOURNIQUET TIME:  Total Tourniquet Time Documented: area (laterality) - 79 minutes Total: area (laterality) - 79 minutes     COMPLICATIONS:  None   CONDITION:  stable  PROCEDURE IN DETAIL: [place] 09/18/2018, 9:23 AM  Patient ID: Jennifer [name], female   DOB: 1946-03-24, 72 y.o.   MRN: [id number]

## 2018-09-18 NOTE — Evaluation (Signed)
Physical Therapy Evaluation Patient Details Name: Jennifer [name] MRN: [id number] DOB: [birth year] Today's Date: 09/18/2018   History of Present Illness  Pt is a 72 y/o female s/p elective R TKA. PMH includes a fib and skin cancer.   Clinical Impression  Pt is s/p surgery above with deficits below. Pt with R knee buckling and dizziness, so mobility limited to chair. Required min A for mobility using RW. Reviewed knee precautions and supine HEP. Will continue to follow acutely to maximize functional mobility independence and safety.     Follow Up Recommendations Follow surgeon's recommendation for DC plan and follow-up therapies;Supervision for mobility/OOB    Equipment Recommendations  Rolling walker with 5" wheels;3in1 (PT)    Recommendations for Other Services       Precautions / Restrictions Precautions Precautions: Knee Precaution Booklet Issued: Yes (comment) Precaution Comments: Reviewed knee precautions with pt.  Restrictions Weight Bearing Restrictions: Yes RLE Weight Bearing: Partial weight bearing RLE Partial Weight Bearing Percentage or Pounds: 50      Mobility  Bed Mobility Overal bed mobility: Needs Assistance Bed Mobility: Supine to Sit     Supine to sit: Supervision     General bed mobility comments: Supervision for safety. Increased time required.   Transfers Overall transfer level: Needs assistance Equipment used: Rolling walker (2 wheeled) Transfers: Sit to/from [facility] Sit to Stand: Min assist Stand pivot transfers: Min assist       General transfer comment: Min A for lift assist and steadying. Pt with R knee buckling, therefore mobility limited to chair. Min A for steadying throughout and cues to press through UEs. Pt also reporting some dizziness with mobility which limited mobility.   Ambulation/Gait             General Gait Details: Deferred   Stairs            Wheelchair Mobility    Modified Rankin  (Stroke Patients Only)       Balance Overall balance assessment: Needs assistance Sitting-balance support: No upper extremity supported Sitting balance-Leahy Scale: Good     Standing balance support: Bilateral upper extremity supported;During functional activity Standing balance-Leahy Scale: Poor Standing balance comment: Reliant on BUE support and external support                              Pertinent Vitals/Pain Pain Assessment: Faces Faces Pain Scale: Hurts little more Pain Location: R knee  Pain Descriptors / Indicators: Aching;Operative site guarding Pain Intervention(s): Limited activity within patient's tolerance;Monitored during session;Repositioned    Home Living Family/patient expects to be discharged to:: Private residence Living Arrangements: Spouse/significant other Available Help at Discharge: Family Type of Home: House Home Access: Stairs to enter Entrance Stairs-Rails: Right Entrance Stairs-Number of Steps: 1 Home Layout: Two level;Laundry or work area in basement;Able to live on main level with bedroom/bathroom Home Equipment: Civil engineer, contracting - built in      Prior Function Level of Independence: Independent         Comments: Very active and playing in [place]        Extremity/Trunk Assessment   Upper Extremity Assessment Upper Extremity Assessment: Overall WFL for tasks assessed    Lower Extremity Assessment Lower Extremity Assessment: RLE deficits/detail RLE Deficits / Details: Deficits consistent with post op pain and weakness. Decreased sensation as well.     Cervical / Trunk Assessment Cervical / Trunk Assessment: Normal  Communication   Communication: No difficulties  Cognition Arousal/Alertness: Awake/alert Behavior During Therapy: WFL for tasks assessed/performed Overall Cognitive Status: Within Functional Limits for tasks assessed                                         General Comments General comments (skin integrity, edema, etc.): Pt's husband present at beginning of session.     Exercises Total Joint Exercises Ankle Circles/Pumps: AROM;Both;20 reps   Assessment/Plan    PT Assessment Patient needs continued PT services  PT Problem List Decreased strength;Decreased balance;Decreased activity tolerance;Decreased range of motion;Decreased mobility;Decreased knowledge of use of DME;Decreased knowledge of precautions;Impaired sensation;Pain;Decreased coordination       PT Treatment Interventions DME instruction;Gait training;Stair training;Therapeutic activities;Functional mobility training;Therapeutic exercise;Balance training;Patient/family education    PT Goals (Current goals can be found in the Care Plan section)  Acute Rehab PT Goals Patient Stated Goal: to get back to playing pickleball  PT Goal Formulation: With patient Time For Goal Achievement: 10/02/18 Potential to Achieve Goals: Good    Frequency 7X/week   Barriers to discharge        Co-evaluation               AM-PAC PT "6 Clicks" Daily Activity  Outcome Measure Difficulty turning over in bed (including adjusting bedclothes, sheets and blankets)?: A Little Difficulty moving from lying on back to sitting on the side of the bed? : A Little Difficulty sitting down on and standing up from a chair with arms (e.g., wheelchair, bedside commode, etc,.)?: Unable Help needed moving to and from a bed to chair (including a wheelchair)?: A Little Help needed walking in hospital room?: A Lot Help needed climbing 3-5 steps with a railing? : A Lot 6 Click Score: 14    End of Session Equipment Utilized During Treatment: Gait belt Activity Tolerance: Treatment limited secondary to medical complications (Comment)(dizziness ) Patient left: in chair;with call bell/phone within reach;with family/visitor present Nurse Communication: Mobility status PT Visit Diagnosis: Unsteadiness on  feet (R26.81);Muscle weakness (generalized) (M62.81);Pain Pain - Right/Left: Right Pain - part of body: Knee    Time: 6384-5364 PT Time Calculation (min) (ACUTE ONLY): 24 min   Charges:   PT Evaluation $PT Eval Low Complexity: 1 Low PT Treatments $Therapeutic Activity: 8-22 mins        Leighton Ruff, PT, DPT  Acute Rehabilitation Services  Pager: [phone] Office: [phone]   Jennifer [name] 09/18/2018, 5:44 PM

## 2018-09-18 NOTE — Transfer of Care (Signed)
Immediate Anesthesia Transfer of Care Note  Patient: Jennifer [name]  Procedure(s) Performed: RIGHT TOTAL KNEE ARTHROPLASTY (Right )  Patient Location: PACU  Anesthesia Type:MAC combined with regional for post-op pain  Level of Consciousness: awake, alert  and oriented  Airway & Oxygen Therapy: Patient Spontanous Breathing and Patient connected to nasal cannula oxygen  Post-op Assessment: Report given to RN and Post -op Vital signs reviewed and stable  Post vital signs: Reviewed and stable  Last Vitals:  Vitals Value Taken Time  BP 99/46 09/18/2018  9:54 AM  Temp    Pulse 55 09/18/2018  9:56 AM  Resp 13 09/18/2018  9:56 AM  SpO2 97 % 09/18/2018  9:56 AM  Vitals shown include unvalidated device data.  Last Pain:  Vitals:   09/18/18 0603  TempSrc:   PainSc: 0-No pain         Complications: No apparent anesthesia complications

## 2018-09-18 NOTE — Care Management Note (Addendum)
Case Management Note  Patient Details  Name: ALISABETH SELKIRK MRN: [id number] Date of Birth: January 14, 1946  Subjective/Objective:   Right TKA                 Action/Plan: NCM spoke to pt and husband at bedside. [employer] will deliver CPM, RW and 3n1 bedside commode to room on 09/19/2018. Offered choice for [facility]. Pt agreeable to [place] # [phone] with [facility] and she will serviced out of the [facility] # [phone] . Placed information on dc paperwork.   Expected Discharge Date:                 Expected Discharge Plan:  [place]  In-House Referral:  NA  Discharge planning Services  CM Consult  Post Acute Care Choice:  Home Health Choice offered to:  Patient  DME Arranged:  3-N-1, Walker rolling, CPM DME Agency:  [facility] [employer]  HH Arranged:  PT [place] Agency:  Other see note  Status of Service:  In process, will continue to follow  If discussed at Long Length of Stay Meetings, dates discussed:    Additional Comments:  Erenest Rasher, RN 09/18/2018, 4:58 PM

## 2018-09-18 NOTE — Op Note (Signed)
NAME: Jennifer [name], Jennifer [name] MEDICAL RECORD [id number] ACCOUNT [id number] DATE OF BIRTH:May 17, 1946 FACILITY: [facility] LOCATION: [place], [place]  OPERATIVE REPORT  DATE OF PROCEDURE:  09/18/2018  PREOPERATIVE DIAGNOSIS:  End-stage osteoarthritis, right knee.  POSTOPERATIVE DIAGNOSIS:  End-stage osteoarthritis, right knee.  PROCEDURE:  Right total knee replacement.  SURGEON:  Joni Fears, MD  ASSISTANT:  Biagio Borg, PA-C.  ANESTHESIA:  Spinal with IV sedation and adductor canal block.  COMPLICATIONS:  None.  COMPONENTS:  DePuy LCS standard femoral component, a #4 rotating keeled tibial tray with a 12 mm polyethylene bridging bearing.  Components were secured with polymethyl methacrylate.  The patella was rotating with 3 pegs.  DESCRIPTION OF PROCEDURE:  The patient was met with her husband in the holding area and identified the right knee as appropriate operative site and marked it accordingly.  Anesthesia performed an adductor canal block.  The patient was then transported to  room 7.  She was placed comfortably on the operating room table in the sitting position.  Anesthesia performed a spinal without complication.  She was then placed supine on the operating table.  Nursing staff inserted a Foley catheter.  Urine was clear.  The right lower extremity was then placed in a thigh tourniquet.  The leg was prepped with chlorhexidine scrub and DuraPrep x2 from the tourniquet to the tips of the toes.  Sterile draping was performed.  Timeout was called.  The patient was administered 2 grams of Ancef IV.  The extremity was then elevated and exsanguinated with a proximal tourniquet at 325 mmHg.  A midline longitudinal incision was made centered about the patella extending from the superior pouch to the tibial tubercle.  Via sharp dissection, incision carried down to subcutaneous tissue.  First layer of capsule was incised in the midline.  A  medial parapatellar  incision was then made with the Bovie.  The joint was entered.  There was a minimal clear yellow joint effusion.  Patella was everted 180 degrees laterally and the knee flexed to 90 degrees.  There were large osteophytes about the patella circumferentially as well as the medial and lateral femoral condyle.  There was minimal cartilage remaining on the patella and  considerable loss of articular cartilage more medial than the lateral femoral condyle and tibial plateau.  There were osteophytes along the medial tibial plateau that were removed.  I sized the standard femoral component.  First bony cut was then made transversely in the proximal tibia with a 7 degree angle of declination using the external tibial guide.  With each of the bony cuts, we checked our alignment to be sure that they appeared to be anatomic.  Subsequent cuts  were then made on the femur using the standard femoral jig.  I used a 4 degree distal femoral valgus cut.  Laminar spreaders were inserted along the medial and lateral compartments.  I removed medial and lateral menisci as well as ACL and PCL.   Osteophytes were removed from the posterior femoral condyle with a 3/4 inch curved osteotome.  Flexion and extension gaps were symmetrical at 12 mm.  Final cuts were then made on the femur obtaining tapering cuts in the center hole.  Retractor was then carefully placed about the tibia, was advanced anteriorly and measured a 4 tibial tray.  This was pinned in place.  The center hole was then made followed by the keeled cut.  With the tibial trial jig in place the 12 mm bridging bearing was then inserted  followed by the standard femoral component.  The entire construct was then reduced and through a full range of motion we had full extension, whereas there was a flexion  contracture preoperatively and no opening with varus or valgus stress.  Tibial polyethylene bearing tracked in the midline.  Patella was then prepared by removing  approximately 10 mm of bone leaving 12.5 mm patella thickness.  Patellar jig was applied, a 3 hole was made and the trial patella inserted.  This was reduced and through the full range of motion remained stable.  The trial components were then removed.  The joint was copiously irrigated with saline solution.  The final components were then impacted with polymethyl methacrylate.  Initially I applied the 4 tibial tray followed by the 12 mm polyethylene bridging bearing 12.5 mm bridging bearing in the standard femoral component.  These were impacted into place.   The knee placed in extension.  Extraneous methacrylate was removed from the periphery of the components.  Patella was applied with methacrylate and a bone clamp.  At approximately 16 minutes, the methacrylate had matured.  During that time, we irrigated the joint and then injected the deep capsule with 0.25% Marcaine with epinephrine.  The tourniquet was deflated at 79 minutes.  We had nice capillary refill to the joint surface and minimal bleeding.  Tranexamic acid was applied and a compression topically for over 5 minutes.  The joint was explored and I thought we had very nice hemostasis.  The deep capsule was closed with a running #1 Ethibond.  The superficial capsule and subcutaneous were closed with Vicryl and 3-0 Monocryl.  Skin closed with skin clips.  Sterile bulky dressing was applied followed by the patient's support stocking.  The patient tolerated these without complications.  TN/NUANCE  D:09/18/2018 T:09/18/2018 JOB:[id number]

## 2018-09-18 NOTE — Anesthesia Procedure Notes (Addendum)
Procedure Name: MAC Date/Time: 09/18/2018 7:28 AM Performed by: Alain Marion, CRNA Pre-anesthesia Checklist: Patient identified, Emergency Drugs available, Suction available and Patient being monitored Oxygen Delivery Method: Simple face mask Placement Confirmation: positive ETCO2

## 2018-09-18 NOTE — Progress Notes (Signed)
The recent History & Physical has been reviewed. I have personally examined the patient today. There is no interval change to the documented History & Physical. The patient would like to proceed with the procedure.  Garald Balding 09/18/2018,  7:11 AM  Patient ID: Jennifer [name], female   DOB: January 02, 1946, 72 y.o.   MRN: [id number]

## 2018-09-19 ENCOUNTER — Encounter (HOSPITAL_COMMUNITY): Payer: Self-pay | Admitting: Orthopaedic Surgery

## 2018-09-19 ENCOUNTER — Telehealth (INDEPENDENT_AMBULATORY_CARE_PROVIDER_SITE_OTHER): Payer: Self-pay | Admitting: Radiology

## 2018-09-19 DIAGNOSIS — M1711 Unilateral primary osteoarthritis, right knee: Secondary | ICD-10-CM | POA: Diagnosis not present

## 2018-09-19 DIAGNOSIS — Z85828 Personal history of other malignant neoplasm of skin: Secondary | ICD-10-CM | POA: Diagnosis not present

## 2018-09-19 DIAGNOSIS — Z87891 Personal history of nicotine dependence: Secondary | ICD-10-CM | POA: Diagnosis not present

## 2018-09-19 LAB — CBC
HCT: 25.3 % — ABNORMAL LOW (ref 36.0–46.0)
Hemoglobin: 8.1 g/dL — ABNORMAL LOW (ref 12.0–15.0)
MCH: 31.3 pg (ref 26.0–34.0)
MCHC: 32 g/dL (ref 30.0–36.0)
MCV: 97.7 fL (ref 80.0–100.0)
Platelets: 171 10*3/uL (ref 150–400)
RBC: 2.59 MIL/uL — ABNORMAL LOW (ref 3.87–5.11)
RDW: 12.3 % (ref 11.5–15.5)
WBC: 5.1 10*3/uL (ref 4.0–10.5)
nRBC: 0 % (ref 0.0–0.2)

## 2018-09-19 LAB — BASIC METABOLIC PANEL
Anion gap: 3 — ABNORMAL LOW (ref 5–15)
BUN: 21 mg/dL (ref 8–23)
CO2: 25 mmol/L (ref 22–32)
Calcium: 8.1 mg/dL — ABNORMAL LOW (ref 8.9–10.3)
Chloride: 106 mmol/L (ref 98–111)
Creatinine, Ser: 1.18 mg/dL — ABNORMAL HIGH (ref 0.44–1.00)
GFR calc Af Amer: 51 mL/min — ABNORMAL LOW (ref >60)
GFR calc non Af Amer: 44 mL/min — ABNORMAL LOW (ref >60)
Glucose, Bld: 135 mg/dL — ABNORMAL HIGH (ref 70–99)
Potassium: 3.9 mmol/L (ref 3.5–5.1)
Sodium: 134 mmol/L — ABNORMAL LOW (ref 135–145)

## 2018-09-19 MED ORDER — METHOCARBAMOL 500 MG PO TABS: 500.0000 mg | ORAL_TABLET | Freq: Four times a day (QID) | ORAL | 0 refills | Status: DC | PRN

## 2018-09-19 MED ORDER — ROPIVACAINE HCL 7.5 MG/ML IJ SOLN
INTRAMUSCULAR | Status: DC | PRN
  Administered 2018-09-18: 20 mL via PERINEURAL

## 2018-09-19 MED ORDER — ASPIRIN 81 MG PO CHEW: 81.0000 mg | CHEWABLE_TABLET | Freq: Two times a day (BID) | ORAL | Status: DC

## 2018-09-19 MED ORDER — BUPIVACAINE IN DEXTROSE 0.75-8.25 % IT SOLN
INTRATHECAL | Status: DC | PRN
  Administered 2018-09-18: 1.6 mL via INTRATHECAL

## 2018-09-19 MED ORDER — OXYCODONE HCL 5 MG PO TABS: 5.0000 mg | ORAL_TABLET | ORAL | 0 refills | Status: DC | PRN

## 2018-09-19 MED ORDER — SCOPOLAMINE 1 MG/3DAYS TD PT72
MEDICATED_PATCH | TRANSDERMAL | Status: AC
  Filled 2018-09-19: qty 1

## 2018-09-19 NOTE — Progress Notes (Signed)
Biagio Borg PA was notified of the patient awaiting to be discharged.  He will call back when the discharge order is available.

## 2018-09-19 NOTE — Progress Notes (Signed)
Physical Therapy Treatment Patient Details Name: Jennifer [name] MRN: [id number] DOB: 05-26-1946 Today's Date: 09/19/2018    History of Present Illness Pt is a 72 y/o female s/p elective R TKA. PMH includes a fib and skin cancer.     PT Comments    Patient seen for mobility progression. Upon sitting up from supine pt experienced nausea with emesis. Pt reports feeling better after and no c/o nausea during rest of session. Pt is making progress toward PT goals and tolerated gait training well. Pt's husband present throughout session and supportive. Pt is very motivated to participate in therapy. Continue to progress as tolerated.    Follow Up Recommendations  Follow surgeon's recommendation for DC plan and follow-up therapies;Supervision for mobility/OOB     Equipment Recommendations  Rolling walker with 5" wheels;3in1 (PT)    Recommendations for Other Services       Precautions / Restrictions Precautions Precautions: Knee Precaution Comments: Reviewed knee precautions/positioning with pt and husband  Restrictions Weight Bearing Restrictions: Yes RLE Weight Bearing: Partial weight bearing RLE Partial Weight Bearing Percentage or Pounds: 50    Mobility  Bed Mobility Overal bed mobility: Needs Assistance Bed Mobility: Supine to Sit     Supine to sit: Supervision     General bed mobility comments: HOB flat and no use of rails; Supervision for safety. Increased time required.   Transfers Overall transfer level: Needs assistance Equipment used: Rolling walker (2 wheeled) Transfers: Sit to/from Stand Sit to Stand: Min guard         General transfer comment: min guard for safety; cues for safe hand placement  Ambulation/Gait Ambulation/Gait assistance: Min guard Gait Distance (Feet): 180 Feet Assistive device: Rolling walker (2 wheeled) Gait Pattern/deviations: Step-through pattern;Decreased stance time - right;Decreased step length - left;Decreased weight shift to  right Gait velocity: decreased   General Gait Details: cues for R heel strike/toe off, increased R knee flexion during swing phase, and increased R LE weight bearing   Stairs             Wheelchair Mobility    Modified Rankin (Stroke Patients Only)       Balance Overall balance assessment: Needs assistance Sitting-balance support: No upper extremity supported Sitting balance-Leahy Scale: Good     Standing balance support: Bilateral upper extremity supported;During functional activity Standing balance-Leahy Scale: Poor                              Cognition Arousal/Alertness: Awake/alert Behavior During Therapy: WFL for tasks assessed/performed Overall Cognitive Status: Within Functional Limits for tasks assessed                                        Exercises      General Comments General comments (skin integrity, edema, etc.): husband present throughout session      Pertinent Vitals/Pain Pain Assessment: Faces Faces Pain Scale: Hurts little more Pain Location: R knee/thigh Pain Descriptors / Indicators: Operative site guarding;Grimacing;Sore Pain Intervention(s): Limited activity within patient's tolerance;Monitored during session;Repositioned;Premedicated before session    Home Living                      Prior Function            PT Goals (current goals can now be found in the care plan section) Acute Rehab PT  Goals Patient Stated Goal: to get back to playing pickleball  Progress towards PT goals: Progressing toward goals    Frequency    7X/week      PT Plan Current plan remains appropriate    Co-evaluation              AM-PAC PT "6 Clicks" Daily Activity  Outcome Measure  Difficulty turning over in bed (including adjusting bedclothes, sheets and blankets)?: A Little Difficulty moving from lying on back to sitting on the side of the bed? : A Little Difficulty sitting down on and standing up  from a chair with arms (e.g., wheelchair, bedside commode, etc,.)?: Unable Help needed moving to and from a bed to chair (including a wheelchair)?: A Little Help needed walking in hospital room?: A Little Help needed climbing 3-5 steps with a railing? : A Little 6 Click Score: 16    End of Session Equipment Utilized During Treatment: Gait belt Activity Tolerance: Patient tolerated treatment well;Other (comment)(nausea and emesis upon initial sitting up from supine) Patient left: in chair;with call bell/phone within reach;with family/visitor present Nurse Communication: Mobility status PT Visit Diagnosis: Unsteadiness on feet (R26.81);Muscle weakness (generalized) (M62.81);Pain Pain - Right/Left: Right Pain - part of body: Knee     Time: 6808-8110 PT Time Calculation (min) (ACUTE ONLY): 31 min  Charges:  $Gait Training: 8-22 mins $Therapeutic Activity: 8-22 mins                     Earney Navy, PTA Acute Rehabilitation Services Pager: [phone] Office: [phone]     Darliss Cheney 09/19/2018, 10:53 AM

## 2018-09-19 NOTE — Anesthesia Procedure Notes (Signed)
Spinal  Patient location during procedure: OR Start time: 09/18/2018 7:18 AM End time: 09/18/2018 7:28 AM Staffing Anesthesiologist: Freddrick March, MD Performed: anesthesiologist  Preanesthetic Checklist Completed: patient identified, surgical consent, pre-op evaluation, timeout performed, IV checked, risks and benefits discussed and monitors and equipment checked Spinal Block Patient position: sitting Prep: site prepped and draped and DuraPrep Patient monitoring: cardiac monitor, continuous pulse ox and blood pressure Approach: midline Location: L3-4 Injection technique: single-shot Needle Needle type: Pencan  Needle gauge: 24 G Needle length: 9 cm Assessment Sensory level: T6 Additional Notes Functioning IV was confirmed and monitors were applied. Sterile prep and drape, including hand hygiene and sterile gloves were used. The patient was positioned and the spine was prepped. The skin was anesthetized with lidocaine.  Free flow of clear CSF was obtained prior to injecting local anesthetic into the CSF.  The spinal needle aspirated freely following injection.  The needle was carefully withdrawn.  The patient tolerated the procedure well.

## 2018-09-19 NOTE — Telephone Encounter (Signed)
Rod Holler, Case Manager, called and states she needs order to change patient to observation status since she is discharging today.  They already have discharge orders.  Per verbal from Dr. Durward Fortes, ok to change. I called Rod Holler and advised.

## 2018-09-19 NOTE — Care Management CC44 (Signed)
Condition Code 44 Documentation Completed  Patient Details  Name: Jennifer [name] MRN: [id number] Date of Birth: 12-Mar-1946   Condition Code 44 given:  Yes Patient signature on Condition Code 44 notice:  Yes Documentation of 2 MD's agreement:  Yes Code 44 added to claim:  Yes    Erenest Rasher, RN 09/19/2018, 4:44 PM

## 2018-09-19 NOTE — Anesthesia Postprocedure Evaluation (Signed)
Anesthesia Post Note  Patient: Jennifer [name]  Procedure(s) Performed: RIGHT TOTAL KNEE ARTHROPLASTY (Right )     Patient location during evaluation: PACU Anesthesia Type: Regional Level of consciousness: oriented and awake and alert Pain management: pain level controlled Vital Signs Assessment: post-procedure vital signs reviewed and stable Respiratory status: spontaneous breathing, respiratory function stable and patient connected to nasal cannula oxygen Cardiovascular status: blood pressure returned to baseline and stable Postop Assessment: no headache, no backache and no apparent nausea or vomiting Anesthetic complications: no    Last Vitals:  Vitals:   09/19/18 0836 09/19/18 1500  BP: 125/65 134/60  Pulse: 63 68  Resp: 20 18  Temp:  37 C  SpO2: 99% 100%    Last Pain:  Vitals:   09/19/18 1500  TempSrc: Oral  PainSc:    Pain Goal:                 Aliya Sol L Shavontae Gibeault

## 2018-09-19 NOTE — Discharge Instructions (Signed)
INSTRUCTIONS AFTER JOINT REPLACEMENT   o Remove items at home which could result in a fall. This includes throw rugs or furniture in walking pathways o ICE to the affected joint every three hours while awake for 30 minutes at a time, for at least the first 3-5 days, and then as needed for pain and swelling.  Continue to use ice for pain and swelling. You may notice swelling that will progress down to the foot and ankle.  This is normal after surgery.  Elevate your leg when you are not up walking on it.   o Continue to use the breathing machine you got in the hospital (incentive spirometer) which will help keep your temperature down.  It is common for your temperature to cycle up and down following surgery, especially at night when you are not up moving around and exerting yourself.  The breathing machine keeps your lungs expanded and your temperature down.   DIET:  As you were doing prior to hospitalization, we recommend a well-balanced diet.  DRESSING / WOUND CARE / SHOWERING  {TOM Dressing:304520100}  ACTIVITY  o Increase activity slowly as tolerated, but follow the weight bearing instructions below.   o No driving for 6 weeks or until further direction given by your physician.  You cannot drive while taking narcotics.  o No lifting or carrying greater than 10 lbs. until further directed by your surgeon. o Avoid periods of inactivity such as sitting longer than an hour when not asleep. This helps prevent blood clots.  o You may return to work once you are authorized by your doctor.     WEIGHT BEARING   {TOM Weight Bearing:304520200}   EXERCISES  Results after joint replacement surgery are often greatly improved when you follow the exercise, range of motion and muscle strengthening exercises prescribed by your doctor. Safety measures are also important to protect the joint from further injury. Any time any of these exercises cause you to have increased pain or swelling, decrease what  you are doing until you are comfortable again and then slowly increase them. If you have problems or questions, call your caregiver or physical therapist for advice.   Rehabilitation is important following a joint replacement. After just a few days of immobilization, the muscles of the leg can become weakened and shrink (atrophy).  These exercises are designed to build up the tone and strength of the thigh and leg muscles and to improve motion. Often times heat used for twenty to thirty minutes before working out will loosen up your tissues and help with improving the range of motion but do not use heat for the first two weeks following surgery (sometimes heat can increase post-operative swelling).   These exercises can be done on a training (exercise) mat, on the floor, on a table or on a bed. Use whatever works the best and is most comfortable for you.    Use music or television while you are exercising so that the exercises are a pleasant break in your day. This will make your life better with the exercises acting as a break in your routine that you can look forward to.   Perform all exercises about fifteen times, three times per day or as directed.  You should exercise both the operative leg and the other leg as well.  Exercises include:    Quad Sets - Tighten up the muscle on the front of the thigh (Quad) and hold for 5-10 seconds.    Straight Leg Raises -  With your knee straight (if you were given a brace, keep it on), lift the leg to 60 degrees, hold for 3 seconds, and slowly lower the leg.  Perform this exercise against resistance later as your leg gets stronger.   Leg Slides: Lying on your back, slowly slide your foot toward your buttocks, bending your knee up off the floor (only go as far as is comfortable). Then slowly slide your foot back down until your leg is flat on the floor again.   Angel Wings: Lying on your back spread your legs to the side as far apart as you can without causing  discomfort.   Hamstring Strength:  Lying on your back, push your heel against the floor with your leg straight by tightening up the muscles of your buttocks.  Repeat, but this time bend your knee to a comfortable angle, and push your heel against the floor.  You may put a pillow under the heel to make it more comfortable if necessary.   A rehabilitation program following joint replacement surgery can speed recovery and prevent re-injury in the future due to weakened muscles. Contact your doctor or a physical therapist for more information on knee rehabilitation.    CONSTIPATION  Constipation is defined medically as fewer than three stools per week and severe constipation as less than one stool per week.  Even if you have a regular bowel pattern at home, your normal regimen is likely to be disrupted due to multiple reasons following surgery.  Combination of anesthesia, postoperative narcotics, change in appetite and fluid intake all can affect your bowels.   YOU MUST use at least one of the following options; they are listed in order of increasing strength to get the job done.  They are all available over the counter, and you may need to use some, POSSIBLY even all of these options:    Drink plenty of fluids (prune juice may be helpful) and high fiber foods Colace 100 mg by mouth twice a day  Senokot for constipation as directed and as needed Dulcolax (bisacodyl), take with full glass of water  Miralax (polyethylene glycol) once or twice a day as needed.  If you have tried all these things and are unable to have a bowel movement in the first 3-4 days after surgery call either your surgeon or your primary doctor.    If you experience loose stools or diarrhea, hold the medications until you stool forms back up.  If your symptoms do not get better within 1 week or if they get worse, check with your doctor.  If you experience "the worst abdominal pain ever" or develop nausea or vomiting, please  contact the office immediately for further recommendations for treatment.   ITCHING:  If you experience itching with your medications, try taking only a single pain pill, or even half a pain pill at a time.  You can also use Benadryl over the counter for itching or also to help with sleep.   TED HOSE STOCKINGS:  Use stockings on both legs until for at least 2 weeks or as directed by physician office. They may be removed at night for sleeping.  MEDICATIONS:  See your medication summary on the After Visit Summary that nursing will review with you.  You may have some home medications which will be placed on hold until you complete the course of blood thinner medication.  It is important for you to complete the blood thinner medication as prescribed.  PRECAUTIONS:  If  you experience chest pain or shortness of breath - call 911 immediately for transfer to the hospital emergency department.   If you develop a fever greater that 101 F, purulent drainage from wound, increased redness or drainage from wound, foul odor from the wound/dressing, or calf pain - CONTACT YOUR SURGEON.                                                   FOLLOW-UP APPOINTMENTS:  If you do not already have a post-op appointment, please call the office for an appointment to be seen by your surgeon.  Guidelines for how soon to be seen are listed in your After Visit Summary, but are typically between 1-4 weeks after surgery.  OTHER INSTRUCTIONS:   Knee Replacement:  Do not place pillow under knee, focus on keeping the knee straight while resting. CPM instructions: 0-90 degrees, 2 hours in the morning, 2 hours in the afternoon, and 2 hours in the evening. Place foam block, curve side up under heel at all times except when in CPM or when walking.  DO NOT modify, tear, cut, or change the foam block in any way.  MAKE SURE YOU:   Understand these instructions.   Get help right away if you are not doing well or get worse.    Thank  you for letting us be a part of your medical care team.  It is a privilege we respect greatly.  We hope these instructions will help you stay on track for a fast and full recovery!

## 2018-09-19 NOTE — Progress Notes (Signed)
Written and verbal discharge instructions provided to the patient.  The patient and his wife verbalizes understanding those instructions.  The patient has a follow up appointment November 27 at 9 am.  The patient will be discharged via wheel chair.

## 2018-09-19 NOTE — Anesthesia Procedure Notes (Signed)
Anesthesia Regional Block: Adductor canal block   Pre-Anesthetic Checklist: ,, timeout performed, Correct Patient, Correct Site, Correct Laterality, Correct Procedure, Correct Position, site marked, Risks and benefits discussed,  Surgical consent,  Pre-op evaluation,  At surgeon's request and post-op pain management  Laterality: Right  Prep: Maximum Sterile Barrier Precautions used, chloraprep       Needles:  Injection technique: Single-shot  Needle Type: Echogenic Stimulator Needle     Needle Length: 9cm  Needle Gauge: 22     Additional Needles:   Procedures:,,,, ultrasound used (permanent image in chart),,,,  Narrative:  Start time: 09/18/2018 6:49 AM End time: 09/18/2018 6:59 AM Injection made incrementally with aspirations every 5 mL.  Performed by: Personally  Anesthesiologist: Freddrick March, MD  Additional Notes: Monitors applied. No increased pain on injection. No increased resistance to injection. Injection made in 5cc increments. Good needle visualization. Patient tolerated procedure well.

## 2018-09-19 NOTE — Plan of Care (Signed)
  Problem: Education: Goal: Knowledge of General Education information will improve Description Including pain rating scale, medication(s)/side effects and non-pharmacologic comfort measures Outcome: Progressing Note:  POC and pain management reviewed with pt.   

## 2018-09-19 NOTE — Discharge Summary (Signed)
Joni Fears, MD   Biagio Borg, PA-C [address] [place], [place]  [address]                             [phone]  PATIENT ID: PHOENICIA PIRIE        MRN:  [id number]          DOB/AGE: July 26, 1946 / 72 y.o.    DISCHARGE SUMMARY  ADMISSION DATE:    09/18/2018 DISCHARGE DATE:   09/19/2018   ADMISSION DIAGNOSIS: right knee osteoarthritis    DISCHARGE DIAGNOSIS:  right knee osteoarthritis    ADDITIONAL DIAGNOSIS: Active Problems:   Primary osteoarthritis of right knee   Osteoarthritis of right knee  Past Medical History:  Diagnosis Date  . Atrial fibrillation ([place]) 12/2010   PMH of ; [place] , [place] [facility]  . DJD (degenerative joint disease)   . History of blood transfusion 2440   complication after childbirth  . Osteoarthritis    "knees, hands, fingers, toes" (09/18/2018)  . Skin cancer of face 2015   Patient reports this was likely basal cell on the right side of her face needing 14 stitches.  . Smoldering multiple myeloma ([place]) 2016   "pre bone cancer; being monitored for this q 4 months or so" (09/18/2018)  . Squamous cell carcinoma of scalp 2000   S/P MOHS    PROCEDURE: Procedure(s): RIGHT TOTAL KNEE ARTHROPLASTY  on 09/18/2018  CONSULTS: none HISTORY:see H&P in chart  HOSPITAL COURSE:  NANDI TONNESEN is a 72 y.o. admitted on 09/18/2018 and found to have a diagnosis of right knee osteoarthritis.  After appropriate laboratory studies were obtained patient was taken to the operating room on 09/18/2018 and underwent  Procedure(s): RIGHT TOTAL KNEE ARTHROPLASTY    They were given perioperative antibiotics:  Anti-infectives (From admission, onward)   Start     Dose/Rate Route Frequency Ordered Stop   09/18/18 1345  ceFAZolin (ANCEF) IVPB 2g/100 mL premix     2 g 200 mL/hr over 30 Minutes Intravenous Every 6 hours 09/18/18 1132 09/19/18 0144   09/18/18 0600  ceFAZolin (ANCEF) IVPB 2g/100 mL premix     2 g 200 mL/hr over 30 Minutes Intravenous On call to  O.R. 09/18/18 0541 09/18/18 0745    .  Tolerated the procedure well.  Placed with a foley intraoperatively.     Toradol was given post op.  POD #1, allowed out of bed to a chair.  PT for ambulation and exercise program.  Foley D/C'd in morning.  IV saline locked.  O2 discontionued.  POD #2, continued PT and ambulation.   . Pain controlled with oxycodone The remainder of the hospital course was dedicated to ambulation and strengthening.   The patient was discharged on 1 Day Post-Op in  Stable condition.  Blood products given:none  DIAGNOSTIC STUDIES: Recent vital signs:  Patient Vitals for the past 24 hrs:  BP Temp Temp src Pulse Resp SpO2  09/19/18 1500 134/60 98.6 F (37 C) Oral 68 18 100 %  09/19/18 0836 125/65 - - 63 20 99 %  09/19/18 0835 - 97.8 F (36.6 C) Oral - - -  09/19/18 0547 (!) 114/47 (!) 97.5 F (36.4 C) Oral (!) 50 20 100 %  09/19/18 0047 (!) 95/56 (!) 97.4 F (36.3 C) Oral 65 20 97 %  09/18/18 2102 (!) 97/59 98.2 F (36.8 C) Oral 61 20 99 %  09/18/18 1625 109/70 97.7 F (36.5  C) Oral 63 20 100 %       Recent laboratory studies: Recent Labs    09/19/18 0226  WBC 5.1  HGB 8.1*  HCT 25.3*  PLT 171   Recent Labs    09/19/18 0226  NA 134*  K 3.9  CL 106  CO2 25  BUN 21  CREATININE 1.18*  GLUCOSE 135*  CALCIUM 8.1*   Lab Results  Component Value Date   INR 1.03 09/05/2018   INR 0.98 03/01/2018   INR 0.97 07/17/2015     Recent Radiographic Studies :  Dg Chest 2 View  Result Date: 09/06/2018 CLINICAL DATA:  Total right knee replacement. EXAM: CHEST - 2 VIEW COMPARISON:  CT 03/01/2018. FINDINGS: Mediastinum hilar structures normal. Lungs are clear. No focal infiltrate. No pleural effusion or pneumothorax. Calcified pulmonary nodule in the left mid lung field consistent granuloma. Thoracolumbar spine scoliosis and degenerative change. IMPRESSION: No acute cardiopulmonary disease. Electronically Signed   By: Marcello Moores  Register   On: 09/06/2018 07:23     DISCHARGE INSTRUCTIONS: Discharge Instructions    CPM   Complete by:  As directed    Continuous passive motion machine (CPM):      Use the CPM from 0 to  60 for  6-8 hours per day.      You may increase by  5 degreesper day.  You may break it up into 2 or 3 sessions per day.      Use CPM for  2 weeks or until you are told to stop.   Call MD / Call 911   Complete by:  As directed    If you experience chest pain or shortness of breath, CALL 911 and be transported to the hospital emergency room.  If you develope a fever above 101 F, pus (white drainage) or increased drainage or redness at the wound, or calf pain, call your surgeon's office.   Change dressing   Complete by:  As directed    Do not change dressing until seen in office   Constipation Prevention   Complete by:  As directed    Drink plenty of fluids.  Prune juice may be helpful.  You may use a stool softener, such as Colace (over the counter) 100 mg twice a day.  Use MiraLax (over the counter) for constipation as needed.   Diet - low sodium heart healthy   Complete by:  As directed    Discharge instructions   Complete by:  As directed    .tom   Do not put a pillow under the knee. Place it under the heel.   Complete by:  As directed    Driving restrictions   Complete by:  As directed    No driving for  At least 4 weeksweeks   Increase activity slowly as tolerated   Complete by:  As directed    Lifting restrictions   Complete by:  As directed    No lifting for6  weeks   TED hose   Complete by:  As directed    Use stockings (TED hose) for 4 weeks on  Operative leg(s).  You may remove them at night for sleeping.      DISCHARGE MEDICATIONS:   Allergies as of 09/19/2018   No Known Allergies     Medication List    STOP taking these medications   amoxicillin 500 MG capsule Commonly known as:  AMOXIL   ibuprofen 200 MG tablet Commonly known as:  ADVIL,MOTRIN  MUSCLE RUB 10-15 % Crea   naproxen sodium 220 MG  tablet Commonly known as:  ALEVE   TURMERIC PO   VITAMIN C PO     TAKE these medications   aspirin 81 MG chewable tablet Chew 1 tablet (81 mg total) by mouth 2 (two) times daily.   methocarbamol 500 MG tablet Commonly known as:  ROBAXIN Take 1 tablet (500 mg total) by mouth every 6 (six) hours as needed for muscle spasms.   oxyCODONE 5 MG immediate release tablet Commonly known as:  Oxy IR/ROXICODONE Take 1-2 tablets (5-10 mg total) by mouth every 4 (four) hours as needed for up to 2 doses for moderate pain (pain score 4-6).            Durable Medical Equipment  (From admission, onward)         Start     Ordered   09/18/18 1133  DME Walker rolling  Once    Question:  Patient needs a walker to treat with the following condition  Answer:  S/P TKR (total knee replacement) using cement, right   09/18/18 1132   09/18/18 1133  DME 3 n 1  Once     09/18/18 1132   09/18/18 1133  DME Bedside commode  Once    Question:  Patient needs a bedside commode to treat with the following condition  Answer:  S/P TKR (total knee replacement) using cement, right   09/18/18 1132           Discharge Care Instructions  (From admission, onward)         Start     Ordered   09/19/18 0000  Change dressing    Comments:  Do not change dressing until seen in office   09/19/18 1550          FOLLOW UP VISIT:   Follow-up Information    [facility] Follow up.   Why:  [facility] will call to arrange initial visit Contact information: [place] [address] [phone]          DISPOSITION:   Home  CONDITION:  [place] [phone]  09/19/2018 3:50 PM

## 2018-09-19 NOTE — Progress Notes (Signed)
Physical Therapy Treatment Patient Details Name: Jennifer [name] MRN: [id number] DOB: 1946/08/01 Today's Date: 09/19/2018    History of Present Illness Pt is a 72 y/o female s/p elective R TKA. PMH includes a fib and skin cancer.     PT Comments    Patient seen for mobility progression. Pt tolerated gait and stair training well and requires supervision/min guard for OOB mobility. Pt completed HEP exercises and handout reviewed with pt and pt's husband. Continue to progress as tolerated.     Follow Up Recommendations  Follow surgeon's recommendation for DC plan and follow-up therapies;Supervision for mobility/OOB     Equipment Recommendations  Rolling walker with 5" wheels;3in1 (PT)    Recommendations for Other Services       Precautions / Restrictions Precautions Precautions: Knee Precaution Comments: Reviewed knee precautions/positioning with pt and husband  Restrictions Weight Bearing Restrictions: Yes RLE Weight Bearing: Partial weight bearing RLE Partial Weight Bearing Percentage or Pounds: 50    Mobility  Bed Mobility Overal bed mobility: Modified Independent Bed Mobility: Supine to Sit;Sit to Supine           General bed mobility comments: increased time and effort   Transfers Overall transfer level: Needs assistance Equipment used: Rolling walker (2 wheeled) Transfers: Sit to/from Stand Sit to Stand: Supervision         General transfer comment: for safety; cues for safely using AD when standing   Ambulation/Gait Ambulation/Gait assistance: Min guard;Supervision Gait Distance (Feet): 200 Feet Assistive device: Rolling walker (2 wheeled) Gait Pattern/deviations: Step-through pattern;Decreased stance time - right;Decreased step length - left;Decreased weight shift to right Gait velocity: decreased   General Gait Details: cues for R heel strike, posture, increased stride length, and increased R LE weight bearing    Stairs Stairs: Yes Stairs  assistance: Min guard Stair Management: One rail Right;Step to pattern;Sideways Number of Stairs: (2 steps X 2) General stair comments: cues for sequencing and technique   Wheelchair Mobility    Modified Rankin (Stroke Patients Only)       Balance Overall balance assessment: Needs assistance Sitting-balance support: No upper extremity supported Sitting balance-Leahy Scale: Good     Standing balance support: Bilateral upper extremity supported;During functional activity Standing balance-Leahy Scale: Poor                              Cognition Arousal/Alertness: Awake/alert Behavior During Therapy: WFL for tasks assessed/performed Overall Cognitive Status: Within Functional Limits for tasks assessed                                        Exercises Total Joint Exercises Quad Sets: Strengthening;Both Short Arc Quad: AAROM;Strengthening;Right Heel Slides: AAROM;Strengthening;Right Hip ABduction/ADduction: AROM;Strengthening;Right Straight Leg Raises: AAROM;Strengthening;Right Long Arc Quad: AROM;Strengthening;Right;Seated Knee Flexion: AROM;AAROM;Right;10 reps;Other (comment)(10 second holds) Goniometric ROM: approx 0-90    General Comments General comments (skin integrity, edema, etc.): husband present      Pertinent Vitals/Pain Pain Assessment: Faces Faces Pain Scale: Hurts little more Pain Location: R knee/thigh Pain Descriptors / Indicators: Operative site guarding;Grimacing;Sore Pain Intervention(s): Monitored during session;Premedicated before session;Repositioned    Home Living                      Prior Function            PT Goals (current goals can now  be found in the care plan section) Acute Rehab PT Goals Patient Stated Goal: to get back to playing pickleball  Progress towards PT goals: Progressing toward goals    Frequency    7X/week      PT Plan Current plan remains appropriate    Co-evaluation               AM-PAC PT "6 Clicks" Daily Activity  Outcome Measure  Difficulty turning over in bed (including adjusting bedclothes, sheets and blankets)?: A Little Difficulty moving from lying on back to sitting on the side of the bed? : A Little Difficulty sitting down on and standing up from a chair with arms (e.g., wheelchair, bedside commode, etc,.)?: Unable Help needed moving to and from a bed to chair (including a wheelchair)?: A Little Help needed walking in hospital room?: A Little Help needed climbing 3-5 steps with a railing? : A Little 6 Click Score: 16    End of Session Equipment Utilized During Treatment: Gait belt Activity Tolerance: Patient tolerated treatment well Patient left: in chair;with call bell/phone within reach;with family/visitor present Nurse Communication: Mobility status PT Visit Diagnosis: Unsteadiness on feet (R26.81);Muscle weakness (generalized) (M62.81);Pain Pain - Right/Left: Right Pain - part of body: Knee     Time: 1415-1459 PT Time Calculation (min) (ACUTE ONLY): 44 min  Charges:  $Gait Training: 23-37 mins $Therapeutic Exercise: 8-22 mins                     Earney Navy, PTA Acute Rehabilitation Services Pager: [phone] Office: [phone]     Darliss Cheney 09/19/2018, 3:07 PM

## 2018-09-19 NOTE — Care Management Obs Status (Signed)
[facility] NOTIFICATION   Patient Details  Name: Jennifer [name] MRN: [id number] Date of Birth: [birth year]   Medicare Observation Status Notification Given:  Yes    Erenest Rasher, RN 09/19/2018, 4:44 PM

## 2018-09-20 ENCOUNTER — Telehealth (INDEPENDENT_AMBULATORY_CARE_PROVIDER_SITE_OTHER): Payer: Self-pay | Admitting: Orthopaedic Surgery

## 2018-09-20 DIAGNOSIS — M6281 Muscle weakness (generalized): Secondary | ICD-10-CM | POA: Diagnosis not present

## 2018-09-20 DIAGNOSIS — I4891 Unspecified atrial fibrillation: Secondary | ICD-10-CM | POA: Diagnosis not present

## 2018-09-20 DIAGNOSIS — Z471 Aftercare following joint replacement surgery: Secondary | ICD-10-CM | POA: Diagnosis not present

## 2018-09-20 DIAGNOSIS — E785 Hyperlipidemia, unspecified: Secondary | ICD-10-CM | POA: Diagnosis not present

## 2018-09-20 DIAGNOSIS — Z96651 Presence of right artificial knee joint: Secondary | ICD-10-CM | POA: Diagnosis not present

## 2018-09-20 DIAGNOSIS — E559 Vitamin D deficiency, unspecified: Secondary | ICD-10-CM | POA: Diagnosis not present

## 2018-09-20 NOTE — Telephone Encounter (Signed)
[facility] called to get clarification of weight bearing for patient.  Call was transferred to Roxy.

## 2018-09-21 ENCOUNTER — Encounter (INDEPENDENT_AMBULATORY_CARE_PROVIDER_SITE_OTHER): Payer: Self-pay

## 2018-09-21 DIAGNOSIS — E785 Hyperlipidemia, unspecified: Secondary | ICD-10-CM | POA: Diagnosis not present

## 2018-09-21 DIAGNOSIS — I4891 Unspecified atrial fibrillation: Secondary | ICD-10-CM | POA: Diagnosis not present

## 2018-09-21 DIAGNOSIS — Z96651 Presence of right artificial knee joint: Secondary | ICD-10-CM | POA: Diagnosis not present

## 2018-09-21 DIAGNOSIS — Z471 Aftercare following joint replacement surgery: Secondary | ICD-10-CM | POA: Diagnosis not present

## 2018-09-21 DIAGNOSIS — M6281 Muscle weakness (generalized): Secondary | ICD-10-CM | POA: Diagnosis not present

## 2018-09-21 DIAGNOSIS — E559 Vitamin D deficiency, unspecified: Secondary | ICD-10-CM | POA: Diagnosis not present

## 2018-09-24 DIAGNOSIS — Z471 Aftercare following joint replacement surgery: Secondary | ICD-10-CM | POA: Diagnosis not present

## 2018-09-24 DIAGNOSIS — I4891 Unspecified atrial fibrillation: Secondary | ICD-10-CM | POA: Diagnosis not present

## 2018-09-24 DIAGNOSIS — Z96651 Presence of right artificial knee joint: Secondary | ICD-10-CM | POA: Diagnosis not present

## 2018-09-24 DIAGNOSIS — E785 Hyperlipidemia, unspecified: Secondary | ICD-10-CM | POA: Diagnosis not present

## 2018-09-24 DIAGNOSIS — E559 Vitamin D deficiency, unspecified: Secondary | ICD-10-CM | POA: Diagnosis not present

## 2018-09-24 DIAGNOSIS — M6281 Muscle weakness (generalized): Secondary | ICD-10-CM | POA: Diagnosis not present

## 2018-09-26 DIAGNOSIS — Z96651 Presence of right artificial knee joint: Secondary | ICD-10-CM | POA: Diagnosis not present

## 2018-09-26 DIAGNOSIS — I4891 Unspecified atrial fibrillation: Secondary | ICD-10-CM | POA: Diagnosis not present

## 2018-09-26 DIAGNOSIS — E559 Vitamin D deficiency, unspecified: Secondary | ICD-10-CM | POA: Diagnosis not present

## 2018-09-26 DIAGNOSIS — Z471 Aftercare following joint replacement surgery: Secondary | ICD-10-CM | POA: Diagnosis not present

## 2018-09-26 DIAGNOSIS — M6281 Muscle weakness (generalized): Secondary | ICD-10-CM | POA: Diagnosis not present

## 2018-09-26 DIAGNOSIS — E785 Hyperlipidemia, unspecified: Secondary | ICD-10-CM | POA: Diagnosis not present

## 2018-09-28 DIAGNOSIS — Z471 Aftercare following joint replacement surgery: Secondary | ICD-10-CM | POA: Diagnosis not present

## 2018-09-28 DIAGNOSIS — Z96651 Presence of right artificial knee joint: Secondary | ICD-10-CM | POA: Diagnosis not present

## 2018-09-28 DIAGNOSIS — E559 Vitamin D deficiency, unspecified: Secondary | ICD-10-CM | POA: Diagnosis not present

## 2018-09-28 DIAGNOSIS — E785 Hyperlipidemia, unspecified: Secondary | ICD-10-CM | POA: Diagnosis not present

## 2018-09-28 DIAGNOSIS — I4891 Unspecified atrial fibrillation: Secondary | ICD-10-CM | POA: Diagnosis not present

## 2018-09-28 DIAGNOSIS — M6281 Muscle weakness (generalized): Secondary | ICD-10-CM | POA: Diagnosis not present

## 2018-10-01 DIAGNOSIS — E785 Hyperlipidemia, unspecified: Secondary | ICD-10-CM | POA: Diagnosis not present

## 2018-10-01 DIAGNOSIS — Z471 Aftercare following joint replacement surgery: Secondary | ICD-10-CM | POA: Diagnosis not present

## 2018-10-01 DIAGNOSIS — Z96651 Presence of right artificial knee joint: Secondary | ICD-10-CM | POA: Diagnosis not present

## 2018-10-01 DIAGNOSIS — I4891 Unspecified atrial fibrillation: Secondary | ICD-10-CM | POA: Diagnosis not present

## 2018-10-01 DIAGNOSIS — E559 Vitamin D deficiency, unspecified: Secondary | ICD-10-CM | POA: Diagnosis not present

## 2018-10-01 DIAGNOSIS — M6281 Muscle weakness (generalized): Secondary | ICD-10-CM | POA: Diagnosis not present

## 2018-10-02 ENCOUNTER — Encounter (INDEPENDENT_AMBULATORY_CARE_PROVIDER_SITE_OTHER): Payer: Self-pay

## 2018-10-02 DIAGNOSIS — I4891 Unspecified atrial fibrillation: Secondary | ICD-10-CM | POA: Diagnosis not present

## 2018-10-02 DIAGNOSIS — E559 Vitamin D deficiency, unspecified: Secondary | ICD-10-CM | POA: Diagnosis not present

## 2018-10-02 DIAGNOSIS — M6281 Muscle weakness (generalized): Secondary | ICD-10-CM | POA: Diagnosis not present

## 2018-10-02 DIAGNOSIS — Z471 Aftercare following joint replacement surgery: Secondary | ICD-10-CM | POA: Diagnosis not present

## 2018-10-02 DIAGNOSIS — E785 Hyperlipidemia, unspecified: Secondary | ICD-10-CM | POA: Diagnosis not present

## 2018-10-02 DIAGNOSIS — Z96651 Presence of right artificial knee joint: Secondary | ICD-10-CM | POA: Diagnosis not present

## 2018-10-03 ENCOUNTER — Ambulatory Visit (INDEPENDENT_AMBULATORY_CARE_PROVIDER_SITE_OTHER): Payer: Medicare Other | Admitting: Orthopaedic Surgery

## 2018-10-03 ENCOUNTER — Encounter (INDEPENDENT_AMBULATORY_CARE_PROVIDER_SITE_OTHER): Payer: Self-pay | Admitting: Orthopaedic Surgery

## 2018-10-03 ENCOUNTER — Ambulatory Visit (INDEPENDENT_AMBULATORY_CARE_PROVIDER_SITE_OTHER): Payer: Medicare Other

## 2018-10-03 VITALS — BP 126/73 | HR 72 | Ht 66.5 in | Wt 134.0 lb

## 2018-10-03 DIAGNOSIS — Z96651 Presence of right artificial knee joint: Secondary | ICD-10-CM

## 2018-10-03 MED ORDER — OXYCODONE-ACETAMINOPHEN 5-325 MG PO TABS: ORAL_TABLET | ORAL | 0 refills | Status: DC

## 2018-10-03 NOTE — Progress Notes (Signed)
Office Visit Note   Patient: Jennifer [name]           Date of Birth: 09/23/1946           MRN: [id number] Visit Date: 10/03/2018              Requested by: [name], MD No address on file PCP: [name], MD   Assessment & Plan: Visit Diagnoses:  1. Status post total knee replacement, right     Plan: 2 weeks status post primary right total knee replacement doing well.  Having physical therapy at [facility].  Has achieved over 100 degrees of flexion but still lacks full extension.  Had full extension of surgery.  No obvious complications.  We will continue with therapy and return to the office in 2 weeks.  Renew prescription for oxycodone No. 20.  Surgical clips removed and Steri-Strips applied  Follow-Up Instructions: Return in about 2 weeks (around 10/17/2018).   Orders:  Orders Placed This Encounter  Procedures  . XR KNEE 3 VIEW RIGHT  . Ambulatory referral to Physical Therapy   Meds ordered this encounter  Medications  . oxyCODONE-acetaminophen (PERCOCET/ROXICET) 5-325 MG tablet    Sig: Take one tablet by mouth every 4-6 hours as needed for pain.    Dispense:  20 tablet    Refill:  0      Procedures: No procedures performed   Clinical Data: No additional findings.   Subjective: Chief Complaint  Patient presents with  . Right Knee - Routine Post Op    09/18/18 Right Total Knee Arthroplasty  Patient returns for first post op visit. She underwent right total knee arthroplasty on 09/18/18. She is doing well. She states that she is ready to improve. Home Health PT has been going well. She ambulates with a cane today. Incision looks good, staples are intact. She takes Oxycodone prn for pain.  HPI  Review of Systems   Objective: Vital Signs: BP 126/73   Pulse 72   Ht 5' 6.5" (1.689 m)   Wt 134 lb (60.8 kg)   LMP 11/07/1996   BMI 21.30 kg/m   Physical Exam  Ortho Exam awake alert and oriented x3.  Comfortable sitting.  Ambulates using  a single-point cane.  Right knee wound is healing nicely without any evidence of complication.  No calf pain.  No distal edema.  Neurovascular exam intact.  No instability.  Had over 100 degrees of flexion to about 6 to 7 degrees to full extension all effusion.  No popliteal pain  Specialty Comments:  No specialty comments available.  Imaging: Xr Knee 3 View Right  Result Date: 10/03/2018 Films of the right total knee replacement were performed.  Alignment looks fine no evidence of a complication.    PMFS History: Patient Active Problem List   Diagnosis Date Noted  . Osteoarthritis of right knee 09/18/2018  . Primary osteoarthritis of both knees 11/30/2017  . Primary osteoarthritis of both feet 11/30/2017  . History of rotator cuff tear repair, bilateral 11/30/2017  . Primary osteoarthritis of right knee 08/23/2017  . Vitamin D deficiency 12/27/2015  . Smoldering multiple myeloma ([place]) 12/22/2015  . Hypergammaglobulinemia   . IgG monoclonal gammopathy of uncertain significance   . Abnormal gamma globulin level 04/14/2015  . Hyperlipidemia 04/03/2015  . Hyperglycemia 04/03/2015  . Diverticulosis of colon without hemorrhage 04/03/2015  . Arthralgia of multiple joints 04/03/2015  . Primary osteoarthritis of both hands 01/20/2010  . SKIN CANCER, HX OF  01/20/2010   Past Medical History:  Diagnosis Date  . Atrial fibrillation ([place]) 12/2010   PMH of ; [place] , [place] [facility]  . DJD (degenerative joint disease)   . History of blood transfusion 0017   complication after childbirth  . Osteoarthritis    "knees, hands, fingers, toes" (09/18/2018)  . Skin cancer of face 2015   Patient reports this was likely basal cell on the right side of her face needing 14 stitches.  . 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 Squamous cell carcinoma of scalp 2000   S/P MOHS    Family History  Problem Relation Age of Onset    . Atrial fibrillation Father   . Asthma Brother   . Alzheimer's disease Mother   . Myasthenia gravis Mother        ocular  . Breast cancer Paternal Grandmother   . Depression Daughter   . Colon cancer Neg Hx   . Rectal cancer Neg Hx   . Stomach cancer Neg Hx   . Diabetes Neg Hx   . Stroke Neg Hx   . Heart attack Neg Hx     Past Surgical History:  Procedure Laterality Date  . CATARACT EXTRACTION W/ INTRAOCULAR LENS  IMPLANT, BILATERAL Bilateral ~ 2015  . CHOLECYSTECTOMY OPEN  1990's  . COLONOSCOPY  02/2013   negative X 3; Dr Olevia Perches  . G 3 P 1    . HAMMER TOE SURGERY Left 2018   2nd digit  . JOINT REPLACEMENT    . MOHS SURGERY  ~ 2009   "back of my head"  . SHOULDER ARTHROSCOPY W/ ROTATOR CUFF REPAIR Right 2012    Dr Durward Fortes  . SHOULDER ARTHROSCOPY W/ ROTATOR CUFF REPAIR Left 06/2014   Dr. Sydnee Cabal for rotator cuff  . TONSILLECTOMY AND ADENOIDECTOMY    . TOTAL KNEE ARTHROPLASTY Right 09/18/2018  . TOTAL KNEE ARTHROPLASTY Right 09/18/2018   Procedure: RIGHT TOTAL KNEE ARTHROPLASTY;  Surgeon: Garald Balding, MD;  Location: [facility];  Service: Orthopedics;  Laterality: Right;  . WISDOM TOOTH EXTRACTION  1960s   Social History   Occupational History  . Not on file  Tobacco Use  . Smoking status: Former Smoker    Packs/day: 0.25    Years: 9.00    Pack years: 2.25    Last attempt to quit: 11/07/1970    Years since quitting: 47.9  . Smokeless tobacco: Never Used  . Tobacco comment: 1/4 ppd [id number]  Substance and Sexual Activity  . Alcohol use: Yes    Alcohol/week: 3.0 standard drinks    Types: 3 Glasses of wine per week  . Drug use: Never  . Sexual activity: Not Currently    Partners: Male

## 2018-10-05 DIAGNOSIS — I4891 Unspecified atrial fibrillation: Secondary | ICD-10-CM | POA: Diagnosis not present

## 2018-10-05 DIAGNOSIS — E559 Vitamin D deficiency, unspecified: Secondary | ICD-10-CM | POA: Diagnosis not present

## 2018-10-05 DIAGNOSIS — Z471 Aftercare following joint replacement surgery: Secondary | ICD-10-CM | POA: Diagnosis not present

## 2018-10-05 DIAGNOSIS — E785 Hyperlipidemia, unspecified: Secondary | ICD-10-CM | POA: Diagnosis not present

## 2018-10-05 DIAGNOSIS — M6281 Muscle weakness (generalized): Secondary | ICD-10-CM | POA: Diagnosis not present

## 2018-10-05 DIAGNOSIS — Z96651 Presence of right artificial knee joint: Secondary | ICD-10-CM | POA: Diagnosis not present

## 2018-10-08 ENCOUNTER — Telehealth (INDEPENDENT_AMBULATORY_CARE_PROVIDER_SITE_OTHER): Payer: Self-pay | Admitting: Orthopaedic Surgery

## 2018-10-08 ENCOUNTER — Encounter (INDEPENDENT_AMBULATORY_CARE_PROVIDER_SITE_OTHER): Payer: Self-pay

## 2018-10-08 DIAGNOSIS — E785 Hyperlipidemia, unspecified: Secondary | ICD-10-CM | POA: Diagnosis not present

## 2018-10-08 DIAGNOSIS — I4891 Unspecified atrial fibrillation: Secondary | ICD-10-CM | POA: Diagnosis not present

## 2018-10-08 DIAGNOSIS — Z471 Aftercare following joint replacement surgery: Secondary | ICD-10-CM | POA: Diagnosis not present

## 2018-10-08 DIAGNOSIS — Z96651 Presence of right artificial knee joint: Secondary | ICD-10-CM | POA: Diagnosis not present

## 2018-10-08 DIAGNOSIS — M6281 Muscle weakness (generalized): Secondary | ICD-10-CM | POA: Diagnosis not present

## 2018-10-08 DIAGNOSIS — E559 Vitamin D deficiency, unspecified: Secondary | ICD-10-CM | POA: Diagnosis not present

## 2018-10-08 NOTE — Telephone Encounter (Signed)
Please advise 

## 2018-10-08 NOTE — Telephone Encounter (Signed)
Patient left a voicemail stating her therapist just left and wanted her to ask Dr. Durward Fortes if it would be okay to remove the top bandage and leave the side strips on.  Patient requested a return call.

## 2018-10-08 NOTE — Telephone Encounter (Signed)
Ok to remove bandage

## 2018-10-08 NOTE — Telephone Encounter (Signed)
Please call patient

## 2018-10-08 NOTE — Telephone Encounter (Signed)
Advised patient it is okay to remove bandage, patient verbalized understanding.

## 2018-10-10 DIAGNOSIS — M1711 Unilateral primary osteoarthritis, right knee: Secondary | ICD-10-CM | POA: Diagnosis not present

## 2018-10-10 DIAGNOSIS — Z471 Aftercare following joint replacement surgery: Secondary | ICD-10-CM | POA: Diagnosis not present

## 2018-10-11 ENCOUNTER — Telehealth: Payer: Self-pay | Admitting: *Deleted

## 2018-10-11 ENCOUNTER — Telehealth (INDEPENDENT_AMBULATORY_CARE_PROVIDER_SITE_OTHER): Payer: Self-pay | Admitting: Radiology

## 2018-10-11 NOTE — Telephone Encounter (Signed)
"  Jennifer [name] with [facility] calling for diagnosis codes for lab work for Jul 19, 2018 for protein electrophoresis and more."  No [facility] service on this date.  Labs scanned to patient EMR by [facility]   Jennifer [name] denies further questions or needs.

## 2018-10-11 NOTE — Telephone Encounter (Signed)
I called patient to advise.

## 2018-10-11 NOTE — Telephone Encounter (Signed)
Ok to drive to appt

## 2018-10-11 NOTE — Telephone Encounter (Signed)
Patient called [facility] office and would like to know if she can drive to her follow up appointment on Wed. Dec. 11.  She is status post right total knee arthroplasty on 09/18/18. She states that she is doing well and is completely off of her pain medication. Her husband will not be available to drive her to her appointment as they are taking care of grandchildren.  Please call patient to advise.  [phone]

## 2018-10-12 DIAGNOSIS — M1711 Unilateral primary osteoarthritis, right knee: Secondary | ICD-10-CM | POA: Diagnosis not present

## 2018-10-12 DIAGNOSIS — Z471 Aftercare following joint replacement surgery: Secondary | ICD-10-CM | POA: Diagnosis not present

## 2018-10-16 DIAGNOSIS — Z471 Aftercare following joint replacement surgery: Secondary | ICD-10-CM | POA: Diagnosis not present

## 2018-10-16 DIAGNOSIS — M1711 Unilateral primary osteoarthritis, right knee: Secondary | ICD-10-CM | POA: Diagnosis not present

## 2018-10-17 ENCOUNTER — Ambulatory Visit (INDEPENDENT_AMBULATORY_CARE_PROVIDER_SITE_OTHER): Payer: Medicare Other | Admitting: Orthopaedic Surgery

## 2018-10-17 ENCOUNTER — Encounter (INDEPENDENT_AMBULATORY_CARE_PROVIDER_SITE_OTHER): Payer: Self-pay | Admitting: Orthopaedic Surgery

## 2018-10-17 VITALS — BP 111/74 | Ht 66.0 in | Wt 130.0 lb

## 2018-10-17 DIAGNOSIS — Z96651 Presence of right artificial knee joint: Secondary | ICD-10-CM | POA: Insufficient documentation

## 2018-10-17 NOTE — Progress Notes (Signed)
Office Visit Note   Patient: Jennifer [name]           Date of Birth: Jun 03, 1946           MRN: [id number] Visit Date: 10/17/2018              Requested by: [name], MD No address on file PCP: [name], MD   Assessment & Plan: Visit Diagnoses:  1. History of total right knee replacement     Plan: 1 month status post primary right total knee replacement doing well.  Drove her self from [facility] this morning.  No ambulatory aid.  Physical therapy notes range of motion -4-115.  Little stiff in the morning but otherwise doing well.  Will urged continued exercises and return in 1 month  Follow-Up Instructions: Return in about 1 month (around 11/17/2018).   Orders:  No orders of the defined types were placed in this encounter.  No orders of the defined types were placed in this encounter.     Procedures: No procedures performed   Clinical Data: No additional findings.   Subjective: Chief Complaint  Patient presents with  . Right Knee - Follow-up    09/18/18 Right TKA  Patient returns for two week follow up. She is status post right total knee arthroplasty on 09/18/18.  She has continued with physical therapy. She states that she is doing well. She rates her pain as a 3/10 and takes advil as needed.  She does state that she is stiff when first getting going, especially after sitting for a while, but that this gets better as she goes along. Progress in physical therapy.  Therapist notes a range of motion of -4-115.  No ambulatory aid.  No fever chills shortness of breath or chest pain HPI  Review of Systems   Objective: Vital Signs: BP 111/74   Ht '5\' 6"'$  (1.676 m)   Wt 130 lb (59 kg)   LMP 11/07/1996   BMI 20.98 kg/m   Physical Exam  Ortho Exam minimal effusion right knee.  Incision healing without problem.  Remainder of Steri-Strips removed.  Neurovascular exam intact.  No calf pain.  No instability lacks just a few degrees to full extension  and flexed over 105 degrees  Specialty Comments:  No specialty comments available.  Imaging: No results found.   PMFS History: Patient Active Problem List   Diagnosis Date Noted  . History of total right knee replacement 10/17/2018  . Osteoarthritis of right knee 09/18/2018  . Primary osteoarthritis of both knees 11/30/2017  . Primary osteoarthritis of both feet 11/30/2017  . History of rotator cuff tear repair, bilateral 11/30/2017  . Primary osteoarthritis of right knee 08/23/2017  . Vitamin D deficiency 12/27/2015  . Smoldering multiple myeloma ([place]) 12/22/2015  . Hypergammaglobulinemia   . IgG monoclonal gammopathy of uncertain significance   . Abnormal gamma globulin level 04/14/2015  . Hyperlipidemia 04/03/2015  . Hyperglycemia 04/03/2015  . Diverticulosis of colon without hemorrhage 04/03/2015  . Arthralgia of multiple joints 04/03/2015  . Primary osteoarthritis of both hands 01/20/2010  . SKIN CANCER, HX OF 01/20/2010   Past Medical History:  Diagnosis Date  . Atrial fibrillation ([place]) 12/2010   PMH of ; [place] , [place] [facility]  . DJD (degenerative joint disease)   . History of blood transfusion 5625   complication after childbirth  . Osteoarthritis    "knees, hands, fingers, toes" (09/18/2018)  . Skin cancer of face 2015   Patient  reports this was likely basal cell on the right side of her face needing 14 stitches.  . Smoldering multiple myeloma ([place]) 2016   "pre bone cancer; being monitored for this q 4 months or so" (09/18/2018)  . Squamous cell carcinoma of scalp 2000   S/P MOHS    Family History  Problem Relation Age of Onset  . Atrial fibrillation Father   . Asthma Brother   . Alzheimer's disease Mother   . Myasthenia gravis Mother        ocular  . Breast cancer Paternal Grandmother   . Depression Daughter   . Colon cancer Neg Hx   . Rectal cancer Neg Hx   . Stomach cancer Neg Hx   . Diabetes Neg Hx   . Stroke Neg Hx   . Heart attack Neg Hx       Past Surgical History:  Procedure Laterality Date  . CATARACT EXTRACTION W/ INTRAOCULAR LENS  IMPLANT, BILATERAL Bilateral ~ 2015  . CHOLECYSTECTOMY OPEN  1990's  . COLONOSCOPY  02/2013   negative X 3; Dr Olevia Perches  . G 3 P 1    . HAMMER TOE SURGERY Left 2018   2nd digit  . JOINT REPLACEMENT    . MOHS SURGERY  ~ 2009   "back of my head"  . SHOULDER ARTHROSCOPY W/ ROTATOR CUFF REPAIR Right 2012    Dr Durward Fortes  . SHOULDER ARTHROSCOPY W/ ROTATOR CUFF REPAIR Left 06/2014   Dr. Sydnee Cabal for rotator cuff  . TONSILLECTOMY AND ADENOIDECTOMY    . TOTAL KNEE ARTHROPLASTY Right 09/18/2018  . TOTAL KNEE ARTHROPLASTY Right 09/18/2018   Procedure: RIGHT TOTAL KNEE ARTHROPLASTY;  Surgeon: Garald Balding, MD;  Location: [facility];  Service: Orthopedics;  Laterality: Right;  . WISDOM TOOTH EXTRACTION  1960s   Social History   Occupational History  . Not on file  Tobacco Use  . Smoking status: Former Smoker    Packs/day: 0.25    Years: 9.00    Pack years: 2.25    Last attempt to quit: 11/07/1970    Years since quitting: 47.9  . Smokeless tobacco: Never Used  . Tobacco comment: 1/4 ppd [id number]  Substance and Sexual Activity  . Alcohol use: Yes    Alcohol/week: 3.0 standard drinks    Types: 3 Glasses of wine per week  . Drug use: Never  . Sexual activity: Not Currently    Partners: Male

## 2018-10-19 DIAGNOSIS — Z471 Aftercare following joint replacement surgery: Secondary | ICD-10-CM | POA: Diagnosis not present

## 2018-10-19 DIAGNOSIS — M1711 Unilateral primary osteoarthritis, right knee: Secondary | ICD-10-CM | POA: Diagnosis not present

## 2018-10-22 DIAGNOSIS — M1711 Unilateral primary osteoarthritis, right knee: Secondary | ICD-10-CM | POA: Diagnosis not present

## 2018-10-22 DIAGNOSIS — Z471 Aftercare following joint replacement surgery: Secondary | ICD-10-CM | POA: Diagnosis not present

## 2018-10-24 DIAGNOSIS — Z471 Aftercare following joint replacement surgery: Secondary | ICD-10-CM | POA: Diagnosis not present

## 2018-10-24 DIAGNOSIS — M1711 Unilateral primary osteoarthritis, right knee: Secondary | ICD-10-CM | POA: Diagnosis not present

## 2018-10-29 DIAGNOSIS — Z471 Aftercare following joint replacement surgery: Secondary | ICD-10-CM | POA: Diagnosis not present

## 2018-10-29 DIAGNOSIS — M1711 Unilateral primary osteoarthritis, right knee: Secondary | ICD-10-CM | POA: Diagnosis not present

## 2018-11-05 DIAGNOSIS — Z471 Aftercare following joint replacement surgery: Secondary | ICD-10-CM | POA: Diagnosis not present

## 2018-11-05 DIAGNOSIS — M1711 Unilateral primary osteoarthritis, right knee: Secondary | ICD-10-CM | POA: Diagnosis not present

## 2018-11-06 NOTE — Progress Notes (Signed)
HEMATOLOGY/ONCOLOGY CLINIC NOTE  Date of Service: 11/08/18      Patient Care Team: [name], MD as PCP - General (Family Medicine)  CHIEF COMPLAINTS/PURPOSE OF CONSULTATION:   Follow up for smoldering multiple myeloma  Diagnosis: Smoldering multiple myeloma  Treatment: Close monitoring  HISTORY OF PRESENTING ILLNESS: please see my initial consultation for details of her initial presentation  INTERVAL HISTORY  Jennifer [name] is here for her scheduled interval follow-up of her smoldering multiple myeloma. The patient's last visit with [name] was on 06/28/18. She is accompanied today by her husband. The pt reports that she is doing well overall.   In the interim the pt had a total right knee replacement on 09/18/18 with Dr. Joni Fears. The pt reports that she has been continuing to pursue physical therapy twice a week after her surgery. She notes there is some stiffness after she sits in the car for a long while. She also notes that she lost 12 pounds after surgery but has since regained this weight to her baseline. She is also continuing to eat better. She denies any leg swelling or SOB.   The pt denies any new bone pains, but notices she feels "something different in her back, slight twinge," but denies overt pain. She denies neck pains and rib pains, and also denies new headaches.   On review of systems, pt reports stable energy levels, eating better, stable weight, keeping active, and denies bone pains, neck pains, rib pains, headaches, leg swelling, abdominal pains, SOB, and any other symptoms.   MEDICAL HISTORY:  Past Medical History:  Diagnosis Date  . Atrial fibrillation ([place]) 12/2010   PMH of ; [place] , [place] [facility]  . DJD (degenerative joint disease)   . History of blood transfusion 3762   complication after childbirth  . Osteoarthritis    "knees, hands, fingers, toes" (09/18/2018)  . Skin cancer of face 2015   Patient reports this was likely basal cell on the right  side of her face needing 14 stitches.  . Smoldering multiple myeloma ([place]) 2016   "pre bone cancer; being monitored for this q 4 months or so" (09/18/2018)  . Squamous cell carcinoma of scalp 2000   S/P MOHS   . Patient Active Problem List   Diagnosis Date Noted  . History of total right knee replacement 10/17/2018  . Osteoarthritis of right knee 09/18/2018  . Primary osteoarthritis of both knees 11/30/2017  . Primary osteoarthritis of both feet 11/30/2017  . History of rotator cuff tear repair, bilateral 11/30/2017  . Primary osteoarthritis of right knee 08/23/2017  . Vitamin D deficiency 12/27/2015  . Smoldering multiple myeloma ([place]) 12/22/2015  . Hypergammaglobulinemia   . IgG monoclonal gammopathy of uncertain significance   . Abnormal gamma globulin level 04/14/2015  . Hyperlipidemia 04/03/2015  . Hyperglycemia 04/03/2015  . Diverticulosis of colon without hemorrhage 04/03/2015  . Arthralgia of multiple joints 04/03/2015  . Primary osteoarthritis of both hands 01/20/2010  . SKIN CANCER, HX OF 01/20/2010    SURGICAL HISTORY: Past Surgical History:  Procedure Laterality Date  . CATARACT EXTRACTION W/ INTRAOCULAR LENS  IMPLANT, BILATERAL Bilateral ~ 2015  . CHOLECYSTECTOMY OPEN  1990's  . COLONOSCOPY  02/2013   negative X 3; Dr Olevia Perches  . G 3 P 1    . HAMMER TOE SURGERY Left 2018   2nd digit  . JOINT REPLACEMENT    . MOHS SURGERY  ~ 2009   "back of my head"  . SHOULDER  ARTHROSCOPY W/ ROTATOR CUFF REPAIR Right 2012    Dr Durward Fortes  . SHOULDER ARTHROSCOPY W/ ROTATOR CUFF REPAIR Left 06/2014   Dr. Sydnee Cabal for rotator cuff  . TONSILLECTOMY AND ADENOIDECTOMY    . TOTAL KNEE ARTHROPLASTY Right 09/18/2018  . TOTAL KNEE ARTHROPLASTY Right 09/18/2018   Procedure: RIGHT TOTAL KNEE ARTHROPLASTY;  Surgeon: Garald Balding, MD;  Location: [facility];  Service: Orthopedics;  Laterality: Right;  . WISDOM TOOTH EXTRACTION  1960s    SOCIAL HISTORY: Social History   Socioeconomic  History  . Marital status: Married    Spouse name: Not on file  . Number of children: Not on file  . Years of education: Not on file  . Highest education level: Not on file  Occupational History  . Not on file  Social Needs  . Financial resource strain: Not on file  . Food insecurity:    Worry: Not on file    Inability: Not on file  . Transportation needs:    Medical: Not on file    Non-medical: Not on file  Tobacco Use  . Smoking status: Former Smoker    Packs/day: 0.25    Years: 9.00    Pack years: 2.25    Last attempt to quit: 11/07/1970    Years since quitting: 48.0  . Smokeless tobacco: Never Used  . Tobacco comment: 1/4 ppd [id number]  Substance and Sexual Activity  . Alcohol use: Yes    Alcohol/week: 3.0 standard drinks    Types: 3 Glasses of wine per week  . Drug use: Never  . Sexual activity: Not Currently    Partners: Male  Lifestyle  . Physical activity:    Days per week: Not on file    Minutes per session: Not on file  . Stress: Not on file  Relationships  . Social connections:    Talks on phone: Not on file    Gets together: Not on file    Attends religious service: Not on file    Active member of club or organization: Not on file    Attends meetings of clubs or organizations: Not on file    Relationship status: Not on file  . Intimate partner violence:    Fear of current or ex partner: Not on file    Emotionally abused: Not on file    Physically abused: Not on file    Forced sexual activity: Not on file  Other Topics Concern  . Not on file  Social History Narrative  . Not on file    FAMILY HISTORY: Family History  Problem Relation Age of Onset  . Atrial fibrillation Father   . Asthma Brother   . Alzheimer's disease Mother   . Myasthenia gravis Mother        ocular  . Breast cancer Paternal Grandmother   . Depression Daughter   . Colon cancer Neg Hx   . Rectal cancer Neg Hx   . Stomach cancer Neg Hx   . Diabetes Neg Hx   . Stroke Neg Hx    . Heart attack Neg Hx     ALLERGIES:  has No Known Allergies.  MEDICATIONS:  Current Outpatient Medications  Medication Sig Dispense Refill  . aspirin 81 MG chewable tablet Chew 1 tablet (81 mg total) by mouth 2 (two) times daily.     No current facility-administered medications for this visit.     REVIEW OF SYSTEMS:    A 10+ POINT REVIEW OF SYSTEMS WAS OBTAINED including neurology,  dermatology, psychiatry, cardiac, respiratory, lymph, extremities, GI, GU, Musculoskeletal, constitutional, breasts, reproductive, HEENT.  All pertinent positives are noted in the HPI.  All others are negative.   PHYSICAL EXAMINATION: ECOG PERFORMANCE STATUS: 0-1 .BP (!) 167/76 (BP Location: Left Arm, Patient Position: Sitting) Comment: Notified Nurse of Bp  Pulse 67   Temp 97.8 F (36.6 C) (Oral)   Resp 18   Ht _0  (1.676 m)   Wt 134 lb 3.2 oz (60.9 kg)   LMP 11/07/1996   SpO2 100%   BMI 21.66 kg/m   Filed Weights   11/08/18 1304  Weight: 134 lb 3.2 oz (60.9 kg)   .Body mass index is 21.66 kg/m.  GENERAL:alert, in no acute distress and comfortable SKIN: no acute rashes, no significant lesions EYES: conjunctiva are pink and non-injected, sclera anicteric OROPHARYNX: MMM, no exudates, no oropharyngeal erythema or ulceration NECK: supple, no JVD LYMPH:  no palpable lymphadenopathy in the cervical, axillary or inguinal regions LUNGS: clear to auscultation b/l with normal respiratory effort HEART: regular rate & rhythm ABDOMEN:  normoactive bowel sounds , non tender, not distended. No palpable hepatosplenomegaly.  Extremity: no pedal edema PSYCH: alert & oriented x 3 with fluent speech NEURO: no focal motor/sensory deficits   LABORATORY DATA:  I have reviewed the data as listed  . CBC Latest Ref Rng & Units 09/19/2018 09/05/2018 03/28/2018  WBC 4.0 - 10.5 K/uL 5.1 6.1 5.1  Hemoglobin 12.0 - 15.0 g/dL 8.1(L) 12.6 12.8  Hematocrit 36.0 - 46.0 % 25.3(L) 38.3 37.4  Platelets 150 - 400  K/uL 171 278 261   . CBC    Component Value Date/Time   WBC 5.1 09/19/2018 0226   RBC 2.59 (L) 09/19/2018 0226   HGB 8.1 (L) 09/19/2018 0226   HGB 12.8 03/28/2018 0841   HGB 12.4 10/26/2017 1002   HCT 25.3 (L) 09/19/2018 0226   HCT 36.2 10/26/2017 1002   PLT 171 09/19/2018 0226   PLT 261 03/28/2018 0841   PLT 235 10/26/2017 1002   MCV 97.7 09/19/2018 0226   MCV 96.0 10/26/2017 1002   MCH 31.3 09/19/2018 0226   MCHC 32.0 09/19/2018 0226   RDW 12.3 09/19/2018 0226   RDW 12.7 10/26/2017 1002   LYMPHSABS 2.5 09/05/2018 1306   LYMPHSABS 2.4 10/26/2017 1002   MONOABS 0.2 09/05/2018 1306   MONOABS 0.2 10/26/2017 1002   EOSABS 0.0 09/05/2018 1306   EOSABS 0.1 10/26/2017 1002   BASOSABS 0.0 09/05/2018 1306   BASOSABS 0.0 10/26/2017 1002    . CMP Latest Ref Rng & Units 09/19/2018 09/05/2018 03/28/2018  Glucose 70 - 99 mg/dL 135(H) 128(H) 83  BUN 8 - 23 mg/dL _1 Creatinine 0.44 - 1.00 mg/dL 1.18(H) 0.83 0.94  Sodium 135 - 145 mmol/L 134(L) 137 139  Potassium 3.5 - 5.1 mmol/L 3.9 4.1 4.8  Chloride 98 - 111 mmol/L 106 106 106  CO2 22 - 32 mmol/L _2 Calcium 8.9 - 10.3 mg/dL 8.1(L) 9.3 9.3  Total Protein 6.5 - 8.1 g/dL - 9.2(H) 9.4(H)  Total Bilirubin 0.3 - 1.2 mg/dL - 0.9 0.5  Alkaline Phos 38 - 126 U/L - 45 65  AST 15 - 41 U/L - 22 22  ALT 0 - 44 U/L - 21 21         03/01/18 Cytogenetics:   03/01/18 Bx:      RADIOGRAPHIC STUDIES: I have personally reviewed the radiological images as listed and agreed with the findings in the report.  No results found. Normal DEXA scan in June 2015     Cytogenetic analysis: Revealed the presence of a normal female chromosomes with no observable clonal chromosomal abnormalities.   ASSESSMENT & PLAN:   72 y.o. Caucasian female in good overall health with  #1Smoldering Multiple Myeloma IgG lambda. Bone marrow plasma cells more than 10% ( noted to have 35% clonal plasma cells] Normal female karyotype cytogenetics  with no other abnormalities SPEP shows M spike of 2.2 (slightly decrease/stabiliyt from previous levels) with IFE showing IgG lambda paraprotein. Associated with some Immunoparesis characterized by associated decrease in IgA and IgM.  M protein 2.6--> 2.6---> 2.2--->2.4--->2.3--> 2.6-->2.4->2.5--> 2.2--->3 -->2.7-->.3  03/02/18 PET/CT revealed No hypermetabolic osseous lesion on today's study. No hypermetabolic soft tissue disease in the neck, chest, abdomen, pelvis, or lower extremities   03/05/18 cytogenetics report revealed that her plasma cells make up 35% of her cells.    PLAN:  -Discussed pt labwork today, 11/08/18; (pt receiving labs after clinic visit) -11/08/18 MMP and SFLC are pending -The pt shows no clinical progression of her smoldering myeloma at this time -Previously ordered repeat bone density scan, her last DEXA scan was on 03/28/14; she would like to have this by her next visit.  -Continue Vitamin D replacement 2000 units daily -Pt is aware to monitor for significant unexplained fatigue, persistent and new focal bone pain in the central skeleton.  -Will see the pt back in 4 months   Labs today RTC with Dr Irene Limbo in 4 months (patient to have labs in outside lab 2 weeks prior to visit)   All off the patients questions were answered to her apparent satisfaction. The patient knows to call the clinic with any problems, questions or concerns.  The total time spent in the appt was 25 minutes and more than 50% was on counseling and direct patient cares.    Sullivan Lone MD [place] AAHIVMS [facility] Hematology/Oncology Physician [place]  (Office):       [phone] (Work cell):  [phone] (Fax):           [phone]  I, Baldwin [place], am acting as a scribe for Dr. Sullivan Lone.   .I have reviewed the above documentation for accuracy and completeness, and I agree with the above. Brunetta Genera MD

## 2018-11-08 ENCOUNTER — Inpatient Hospital Stay: Payer: Medicare Other | Attending: Hematology | Admitting: Hematology

## 2018-11-08 ENCOUNTER — Telehealth: Payer: Self-pay | Admitting: Hematology

## 2018-11-08 ENCOUNTER — Inpatient Hospital Stay: Payer: Medicare Other

## 2018-11-08 VITALS — BP 167/76 | HR 67 | Temp 97.8°F | Resp 18 | Ht 66.0 in | Wt 134.2 lb

## 2018-11-08 DIAGNOSIS — D649 Anemia, unspecified: Secondary | ICD-10-CM

## 2018-11-08 DIAGNOSIS — Z96651 Presence of right artificial knee joint: Secondary | ICD-10-CM | POA: Diagnosis not present

## 2018-11-08 DIAGNOSIS — C9000 Multiple myeloma not having achieved remission: Secondary | ICD-10-CM | POA: Diagnosis not present

## 2018-11-08 DIAGNOSIS — D472 Monoclonal gammopathy: Secondary | ICD-10-CM

## 2018-11-08 DIAGNOSIS — Z87891 Personal history of nicotine dependence: Secondary | ICD-10-CM | POA: Diagnosis not present

## 2018-11-08 LAB — CBC WITH DIFFERENTIAL/PLATELET
Abs Immature Granulocytes: 0.01 10*3/uL (ref 0.00–0.07)
Basophils Absolute: 0 10*3/uL (ref 0.0–0.1)
Basophils Relative: 0 %
Eosinophils Absolute: 0.1 10*3/uL (ref 0.0–0.5)
Eosinophils Relative: 1 %
HCT: 36.6 % (ref 36.0–46.0)
Hemoglobin: 12.3 g/dL (ref 12.0–15.0)
Immature Granulocytes: 0 %
Lymphocytes Relative: 47 %
Lymphs Abs: 3.2 10*3/uL (ref 0.7–4.0)
MCH: 32.5 pg (ref 26.0–34.0)
MCHC: 33.6 g/dL (ref 30.0–36.0)
MCV: 96.6 fL (ref 80.0–100.0)
Monocytes Absolute: 0.2 10*3/uL (ref 0.1–1.0)
Monocytes Relative: 3 %
Neutro Abs: 3.3 10*3/uL (ref 1.7–7.7)
Neutrophils Relative %: 49 %
Platelets: 237 10*3/uL (ref 150–400)
RBC: 3.79 MIL/uL — ABNORMAL LOW (ref 3.87–5.11)
RDW: 12.2 % (ref 11.5–15.5)
WBC: 6.7 10*3/uL (ref 4.0–10.5)
nRBC: 0 % (ref 0.0–0.2)

## 2018-11-08 LAB — CMP (CANCER CENTER ONLY)
ALT: 18 U/L (ref 0–44)
AST: 17 U/L (ref 15–41)
Albumin: 3.6 g/dL (ref 3.5–5.0)
Alkaline Phosphatase: 53 U/L (ref 38–126)
Anion gap: 10 (ref 5–15)
BUN: 16 mg/dL (ref 8–23)
CO2: 24 mmol/L (ref 22–32)
Calcium: 9.5 mg/dL (ref 8.9–10.3)
Chloride: 102 mmol/L (ref 98–111)
Creatinine: 0.92 mg/dL (ref 0.44–1.00)
GFR, Est AFR Am: >60 mL/min (ref >60)
GFR, Estimated: >60 mL/min (ref >60)
Glucose, Bld: 107 mg/dL — ABNORMAL HIGH (ref 70–99)
Potassium: 3.8 mmol/L (ref 3.5–5.1)
Sodium: 136 mmol/L (ref 135–145)
Total Bilirubin: 0.6 mg/dL (ref 0.3–1.2)
Total Protein: 10.7 g/dL — ABNORMAL HIGH (ref 6.5–8.1)

## 2018-11-08 NOTE — Telephone Encounter (Signed)
Printed calendar and avs. °

## 2018-11-09 DIAGNOSIS — Z471 Aftercare following joint replacement surgery: Secondary | ICD-10-CM | POA: Diagnosis not present

## 2018-11-09 DIAGNOSIS — M1711 Unilateral primary osteoarthritis, right knee: Secondary | ICD-10-CM | POA: Diagnosis not present

## 2018-11-09 LAB — IRON AND TIBC
Iron: 108 ug/dL (ref 41–142)
Saturation Ratios: 34 % (ref 21–57)
TIBC: 314 ug/dL (ref 236–444)
UIBC: 206 ug/dL (ref 120–384)

## 2018-11-09 LAB — KAPPA/LAMBDA LIGHT CHAINS
Kappa free light chain: 6.7 mg/L (ref 3.3–19.4)
Kappa, lambda light chain ratio: 0.2 — ABNORMAL LOW (ref 0.26–1.65)
Lambda free light chains: 33.8 mg/L — ABNORMAL HIGH (ref 5.7–26.3)

## 2018-11-09 LAB — FERRITIN: Ferritin: 237 ng/mL (ref 11–307)

## 2018-11-12 LAB — MULTIPLE MYELOMA PANEL, SERUM
Albumin SerPl Elph-Mcnc: 4.2 g/dL (ref 2.9–4.4)
Albumin/Glob SerPl: 0.7 (ref 0.7–1.7)
Alpha 1: 0.3 g/dL (ref 0.0–0.4)
Alpha2 Glob SerPl Elph-Mcnc: 0.7 g/dL (ref 0.4–1.0)
B-Globulin SerPl Elph-Mcnc: 1 g/dL (ref 0.7–1.3)
Gamma Glob SerPl Elph-Mcnc: 4.1 g/dL — ABNORMAL HIGH (ref 0.4–1.8)
Globulin, Total: 6.1 g/dL — ABNORMAL HIGH (ref 2.2–3.9)
IgA: 14 mg/dL — ABNORMAL LOW (ref 64–422)
IgG (Immunoglobin G), Serum: 5688 mg/dL — ABNORMAL HIGH (ref 700–1600)
IgM (Immunoglobulin M), Srm: 6 mg/dL — ABNORMAL LOW (ref 26–217)
M Protein SerPl Elph-Mcnc: 3.9 g/dL — ABNORMAL HIGH
Total Protein ELP: 10.3 g/dL — ABNORMAL HIGH (ref 6.0–8.5)

## 2018-11-13 DIAGNOSIS — Z471 Aftercare following joint replacement surgery: Secondary | ICD-10-CM | POA: Diagnosis not present

## 2018-11-13 DIAGNOSIS — M1711 Unilateral primary osteoarthritis, right knee: Secondary | ICD-10-CM | POA: Diagnosis not present

## 2018-11-14 ENCOUNTER — Encounter (INDEPENDENT_AMBULATORY_CARE_PROVIDER_SITE_OTHER): Payer: Self-pay | Admitting: Orthopaedic Surgery

## 2018-11-14 ENCOUNTER — Encounter: Payer: Self-pay | Admitting: Hematology

## 2018-11-14 ENCOUNTER — Ambulatory Visit (INDEPENDENT_AMBULATORY_CARE_PROVIDER_SITE_OTHER): Payer: Medicare Other | Admitting: Orthopaedic Surgery

## 2018-11-14 VITALS — BP 116/67 | HR 71 | Ht 66.0 in | Wt 135.6 lb

## 2018-11-14 DIAGNOSIS — G8929 Other chronic pain: Secondary | ICD-10-CM | POA: Insufficient documentation

## 2018-11-14 DIAGNOSIS — M25512 Pain in left shoulder: Secondary | ICD-10-CM

## 2018-11-14 DIAGNOSIS — Z96651 Presence of right artificial knee joint: Secondary | ICD-10-CM

## 2018-11-14 MED ORDER — METHYLPREDNISOLONE ACETATE 40 MG/ML IJ SUSP
80.0000 mg | INTRAMUSCULAR | Status: AC | PRN
  Administered 2018-11-14: 80 mg

## 2018-11-14 MED ORDER — LIDOCAINE HCL 2 % IJ SOLN
2.0000 mL | INTRAMUSCULAR | Status: AC | PRN
  Administered 2018-11-14: 2 mL

## 2018-11-14 MED ORDER — BUPIVACAINE HCL 0.5 % IJ SOLN
2.0000 mL | INTRAMUSCULAR | Status: AC | PRN
  Administered 2018-11-14: 2 mL via INTRA_ARTICULAR

## 2018-11-14 NOTE — Progress Notes (Signed)
Office Visit Note   Patient: Jennifer [name]           Date of Birth: 01-Jul-1946           MRN: [id number] Visit Date: 11/14/2018              Requested by: [name], MD No address on file PCP: [name], MD   Assessment & Plan: Visit Diagnoses:  1. History of total right knee replacement   2. Chronic left shoulder pain     Plan: Nearly 2 months status post primary right total knee replacement and doing well with her physical therapy.  Lacks a few degrees to full extension but no significant pain.  We will continue to work on strengthening exercises.  Would suggest seeing her again in 6 to 8 weeks.  She had questions regarding playing pickle ball and I think she needs to have better quad and hamstring strength before she returns.  Also having some recurrent pain in her left shoulder.  She had an MRI scan performed in [place] last year revealing recurrent rotator cuff tearing and possibly biceps pathology.  Jennifer [name] has elected to pursue a nonoperative course and has been rehabbing her shoulder.  She like to have a cortisone injection.  Follow-Up Instructions: Return in about 2 months (around 01/13/2019).   Orders:  Orders Placed This Encounter  Procedures  . Large Joint Inj: L subacromial bursa   No orders of the defined types were placed in this encounter.     Procedures: Large Joint Inj: L subacromial bursa on 11/14/2018 11:33 AM Indications: pain and diagnostic evaluation Details: 25 G 1.5 in needle, anterolateral approach  Arthrogram: No  Medications: 2 mL lidocaine 2 %; 2 mL bupivacaine 0.5 %; 80 mg methylPREDNISolone acetate 40 MG/ML Consent was given by the patient. Immediately prior to procedure a time out was called to verify the correct patient, procedure, equipment, support staff and site/side marked as required. Patient was prepped and draped in the usual sterile fashion.       Clinical Data: No additional findings.   Subjective: Chief  Complaint  Patient presents with  . Right Knee - Follow-up    09/18/2018 Right TKA  Patient returns for follow up. She is status post right total knee arthroplasty on 09/18/2018.  She states that she is doing well. She continues to have a slight pain just medial to the patella which is worse in the mornings. That tends to get better as she gets up and gets moving. She has continued physical therapy which is going well. She takes advil for occasional pain.  Using any ambulatory aid.  Not taking any pain medicine.  Happy with her present course Having some recurrent pain in her left shoulder.  She has had a prior rotator cuff tear repair years ago.  She had a repeat MRI scan performed in [place] last year demonstrating a recurrent tear and possible biceps  Pathology.  He has elected to pursue a nonoperative course.  Having some recurrent pain and would like to have a cortisone injection. HPI  Review of Systems   Objective: Vital Signs: BP 116/67   Pulse 71   Ht '5\' 6"'$  (1.676 m)   Wt 135 lb 9.6 oz (61.5 kg)   LMP 11/07/1996   BMI 21.89 kg/m   Physical Exam  Ortho Exam awake alert and oriented x3.  Comfortable sitting.  Small effusion right knee.  Incision is healing nicely.  No calf pain.  No distal edema.  Flexed about 105 degrees with no instability.  Probably lacked about 4 to 5 degrees to full knee extension but almost neutral with with compression  Able to place left arm fully overhead and easily.  Does have some mild impingement pain.  Biceps intact.  Some grinding with motion of her shoulder but not much pain  Specialty Comments:  No specialty comments available.  Imaging: No results found.   PMFS History: Patient Active Problem List   Diagnosis Date Noted  . Chronic left shoulder pain 11/14/2018  . History of total right knee replacement 10/17/2018  . Osteoarthritis of right knee 09/18/2018  . Primary osteoarthritis of both knees 11/30/2017  . Primary  osteoarthritis of both feet 11/30/2017  . History of rotator cuff tear repair, bilateral 11/30/2017  . Primary osteoarthritis of right knee 08/23/2017  . Vitamin D deficiency 12/27/2015  . Smoldering multiple myeloma ([place]) 12/22/2015  . Hypergammaglobulinemia   . IgG monoclonal gammopathy of uncertain significance   . Abnormal gamma globulin level 04/14/2015  . Hyperlipidemia 04/03/2015  . Hyperglycemia 04/03/2015  . Diverticulosis of colon without hemorrhage 04/03/2015  . Arthralgia of multiple joints 04/03/2015  . Primary osteoarthritis of both hands 01/20/2010  . SKIN CANCER, HX OF 01/20/2010   Past Medical History:  Diagnosis Date  . Atrial fibrillation ([place]) 12/2010   PMH of ; [place] , [place] [facility]  . DJD (degenerative joint disease)   . History of blood transfusion 3151   complication after childbirth  . Osteoarthritis    "knees, hands, fingers, toes" (09/18/2018)  . Skin cancer of face 2015   Patient reports this was likely basal cell on the right side of her face needing 14 stitches.  . Smoldering multiple myeloma ([place]) 2016   "pre bone cancer; being monitored for this q 4 months or so" (09/18/2018)  . Squamous cell carcinoma of scalp 2000   S/P MOHS    Family History  Problem Relation Age of Onset  . Atrial fibrillation Father   . Asthma Brother   . Alzheimer's disease Mother   . Myasthenia gravis Mother        ocular  . Breast cancer Paternal Grandmother   . Depression Daughter   . Colon cancer Neg Hx   . Rectal cancer Neg Hx   . Stomach cancer Neg Hx   . Diabetes Neg Hx   . Stroke Neg Hx   . Heart attack Neg Hx     Past Surgical History:  Procedure Laterality Date  . CATARACT EXTRACTION W/ INTRAOCULAR LENS  IMPLANT, BILATERAL Bilateral ~ 2015  . CHOLECYSTECTOMY OPEN  1990's  . COLONOSCOPY  02/2013   negative X 3; Dr Olevia Perches  . G 3 P 1    . HAMMER TOE SURGERY Left 2018   2nd digit  . JOINT REPLACEMENT    . MOHS SURGERY  ~ 2009   "back of my head"  .  SHOULDER ARTHROSCOPY W/ ROTATOR CUFF REPAIR Right 2012    Dr Durward Fortes  . SHOULDER ARTHROSCOPY W/ ROTATOR CUFF REPAIR Left 06/2014   Dr. Sydnee Cabal for rotator cuff  . TONSILLECTOMY AND ADENOIDECTOMY    . TOTAL KNEE ARTHROPLASTY Right 09/18/2018  . TOTAL KNEE ARTHROPLASTY Right 09/18/2018   Procedure: RIGHT TOTAL KNEE ARTHROPLASTY;  Surgeon: Garald Balding, MD;  Location: [facility];  Service: Orthopedics;  Laterality: Right;  . WISDOM TOOTH EXTRACTION  1960s   Social History   Occupational History  . Not on file  Tobacco  Use  . Smoking status: Former Smoker    Packs/day: 0.25    Years: 9.00    Pack years: 2.25    Last attempt to quit: 11/07/1970    Years since quitting: 48.0  . Smokeless tobacco: Never Used  . Tobacco comment: 1/4 ppd [id number]  Substance and Sexual Activity  . Alcohol use: Yes    Alcohol/week: 3.0 standard drinks    Types: 3 Glasses of wine per week  . Drug use: Never  . Sexual activity: Not Currently    Partners: Male

## 2018-11-15 ENCOUNTER — Encounter: Payer: Self-pay | Admitting: Hematology

## 2018-11-16 ENCOUNTER — Encounter: Payer: Self-pay | Admitting: Hematology

## 2018-11-16 DIAGNOSIS — M1711 Unilateral primary osteoarthritis, right knee: Secondary | ICD-10-CM | POA: Diagnosis not present

## 2018-11-16 DIAGNOSIS — Z471 Aftercare following joint replacement surgery: Secondary | ICD-10-CM | POA: Diagnosis not present

## 2018-11-18 ENCOUNTER — Encounter: Payer: Self-pay | Admitting: Hematology

## 2018-11-19 ENCOUNTER — Other Ambulatory Visit: Payer: Self-pay | Admitting: Hematology

## 2018-11-19 ENCOUNTER — Telehealth: Payer: Self-pay | Admitting: Hematology

## 2018-11-19 DIAGNOSIS — C9000 Multiple myeloma not having achieved remission: Secondary | ICD-10-CM

## 2018-11-19 DIAGNOSIS — Z471 Aftercare following joint replacement surgery: Secondary | ICD-10-CM | POA: Diagnosis not present

## 2018-11-19 DIAGNOSIS — M1711 Unilateral primary osteoarthritis, right knee: Secondary | ICD-10-CM | POA: Diagnosis not present

## 2018-11-19 NOTE — Telephone Encounter (Signed)
I called and discussed the patient's new lab results with her.  M spike is gone up from 2.7 to 3.9 and total IgG levels have gone up to more than 5 g.  Overall this is concerning for progression of her smoldering multiple myeloma to active multiple myeloma. No anemia, renal insufficiency or hypercalcemia at this time. Recommended repeat bone marrow biopsy and PET CT scan.  Patient is agreeable to this plan.  Orders have been placed to have these done in 1 week and see [name] back in clinic in 2 weeks.  Jennifer [name]

## 2018-11-26 ENCOUNTER — Other Ambulatory Visit: Payer: Self-pay | Admitting: Radiology

## 2018-11-27 ENCOUNTER — Ambulatory Visit (HOSPITAL_COMMUNITY)
Admission: RE | Admit: 2018-11-27 | Discharge: 2018-11-27 | Disposition: A | Discharge status: Home / Self Care | Payer: Medicare Other | Source: Ambulatory Visit | Attending: Hematology | Admitting: Hematology

## 2018-11-27 ENCOUNTER — Encounter (HOSPITAL_COMMUNITY): Payer: Self-pay

## 2018-11-27 DIAGNOSIS — D4989 Neoplasm of unspecified behavior of other specified sites: Secondary | ICD-10-CM | POA: Diagnosis not present

## 2018-11-27 DIAGNOSIS — D72820 Lymphocytosis (symptomatic): Secondary | ICD-10-CM | POA: Diagnosis not present

## 2018-11-27 DIAGNOSIS — C9000 Multiple myeloma not having achieved remission: Secondary | ICD-10-CM | POA: Insufficient documentation

## 2018-11-27 HISTORY — DX: Nausea with vomiting, unspecified: R11.2

## 2018-11-27 HISTORY — DX: Other specified postprocedural states: Z98.890

## 2018-11-27 LAB — CBC WITH DIFFERENTIAL/PLATELET
Abs Immature Granulocytes: 0.02 10*3/uL (ref 0.00–0.07)
Basophils Absolute: 0 10*3/uL (ref 0.0–0.1)
Basophils Relative: 0 %
Eosinophils Absolute: 0.1 10*3/uL (ref 0.0–0.5)
Eosinophils Relative: 1 %
HCT: 39.5 % (ref 36.0–46.0)
Hemoglobin: 13 g/dL (ref 12.0–15.0)
Immature Granulocytes: 0 %
Lymphocytes Relative: 51 %
Lymphs Abs: 2.9 10*3/uL (ref 0.7–4.0)
MCH: 32.7 pg (ref 26.0–34.0)
MCHC: 32.9 g/dL (ref 30.0–36.0)
MCV: 99.5 fL (ref 80.0–100.0)
Monocytes Absolute: 0.2 10*3/uL (ref 0.1–1.0)
Monocytes Relative: 3 %
Neutro Abs: 2.6 10*3/uL (ref 1.7–7.7)
Neutrophils Relative %: 45 %
Platelets: 256 10*3/uL (ref 150–400)
RBC: 3.97 MIL/uL (ref 3.87–5.11)
RDW: 12.5 % (ref 11.5–15.5)
WBC: 5.8 10*3/uL (ref 4.0–10.5)
nRBC: 0 % (ref 0.0–0.2)

## 2018-11-27 LAB — GLUCOSE, CAPILLARY: Glucose-Capillary: 83 mg/dL (ref 70–99)

## 2018-11-27 LAB — PROTIME-INR
INR: 1.07
Prothrombin Time: 13.9 seconds (ref 11.4–15.2)

## 2018-11-27 MED ORDER — MIDAZOLAM HCL 2 MG/2ML IJ SOLN
INTRAMUSCULAR | Status: AC | PRN
  Administered 2018-11-27 (×3): 1 mg via INTRAVENOUS

## 2018-11-27 MED ORDER — FENTANYL CITRATE (PF) 100 MCG/2ML IJ SOLN
INTRAMUSCULAR | Status: AC
  Filled 2018-11-27: qty 2

## 2018-11-27 MED ORDER — MIDAZOLAM HCL 2 MG/2ML IJ SOLN
INTRAMUSCULAR | Status: AC
  Filled 2018-11-27: qty 4

## 2018-11-27 MED ORDER — FENTANYL CITRATE (PF) 100 MCG/2ML IJ SOLN
INTRAMUSCULAR | Status: AC | PRN
  Administered 2018-11-27 (×2): 50 ug via INTRAVENOUS

## 2018-11-27 MED ORDER — LIDOCAINE HCL (PF) 1 % IJ SOLN
INTRAMUSCULAR | Status: AC | PRN
  Administered 2018-11-27: 10 mL

## 2018-11-27 MED ORDER — FLUDEOXYGLUCOSE F - 18 (FDG) INJECTION
6.3000 | Freq: Once | INTRAVENOUS | Status: AC | PRN
  Administered 2018-11-27: 6.3 via INTRAVENOUS

## 2018-11-27 MED ORDER — SODIUM CHLORIDE 0.9 % IV SOLN
INTRAVENOUS | Status: DC
  Administered 2018-11-27: 10:00:00 via INTRAVENOUS

## 2018-11-27 NOTE — Discharge Instructions (Signed)
Moderate Conscious Sedation, Adult, Care After These instructions provide you with information about caring for yourself after your procedure. Your health care provider may also give you more specific instructions. Your treatment has been planned according to current medical practices, but problems sometimes occur. Call your health care provider if you have any problems or questions after your procedure. What can I expect after the procedure? After your procedure, it is common:  To feel sleepy for several hours.  To feel clumsy and have poor balance for several hours.  To have poor judgment for several hours.  To vomit if you eat too soon. Follow these instructions at home: For at least 24 hours after the procedure:   Do not: ? Participate in activities where you could fall or become injured. ? Drive. ? Use heavy machinery. ? Drink alcohol. ? Take sleeping pills or medicines that cause drowsiness. ? Make important decisions or sign legal documents. ? Take care of children on your own.  Rest. Eating and drinking  Follow the diet recommended by your health care provider.  If you vomit: ? Drink water, juice, or soup when you can drink without vomiting. ? Make sure you have little or no nausea before eating solid foods. General instructions  Have a responsible adult stay with you until you are awake and alert.  Take over-the-counter and prescription medicines only as told by your health care provider.  If you smoke, do not smoke without supervision.  Keep all follow-up visits as told by your health care provider. This is important. Contact a health care provider if:  You keep feeling nauseous or you keep vomiting.  You feel light-headed.  You develop a rash.  You have a fever. Get help right away if:  You have trouble breathing. This information is not intended to replace advice given to you by your health care provider. Make sure you discuss any questions you have  with your health care provider. Document Released: 08/14/2013 Document Revised: 03/28/2016 Document Reviewed: 02/13/2016 Elsevier Interactive Patient Education  2019 [place].   Bone Marrow Aspiration and Bone Marrow Biopsy, Adult, Care After This sheet gives you information about how to care for yourself after your procedure. Your health care provider may also give you more specific instructions. If you have problems or questions, contact your health care provider. What can I expect after the procedure? After the procedure, it is common to have:  Mild pain and tenderness.  Swelling.  Bruising. Follow these instructions at home: Puncture site care  Follow instructions from your health care provider about how to take care of the puncture site. Make sure you: ? Wash your hands with soap and water before you change your bandage (dressing). If soap and water are not available, use hand sanitizer. ? Change your dressing as told by your health care provider. May remove dressing and shower or bathe in 24 hours. Keep site clean and dry and replace bandaid as necessary.  Wash with soap and water daily.  Check your puncture siteevery day for signs of infection. Check for: ? More redness, swelling, or pain. ? More fluid or blood. ? Warmth. ? Pus or a bad smell. General instructions  Take over-the-counter and prescription medicines only as told by your health care provider.  Do not take baths, swim, or use a hot tub until your health care provider approves. Ask if you can take a shower or have a sponge bath.  Return to your normal activities as told by your  health care provider. Ask your health care provider what activities are safe for you.  Do not drive for 24 hours if you were given a medicine to help you relax (sedative) during your procedure.  Keep all follow-up visits as told by your health care provider. This is important. Contact a health care provider if:  Your pain is not  controlled with medicine. Get help right away if:  You have a fever.  You have more redness, swelling, or pain around the puncture site.  You have more fluid or blood coming from the puncture site.  Your puncture site feels warm to the touch.  You have pus or a bad smell coming from the puncture site. These symptoms may represent a serious problem that is an emergency. Do not wait to see if the symptoms will go away. Get medical help right away. Call your local emergency services (911 in the [place] Do not drive yourself to the hospital. Summary  After the procedure, it is common to have mild pain, tenderness, swelling, and bruising.  Follow instructions from your health care provider about how to take care of the puncture site.  Get help right away if you have any symptoms of infection or if you have more blood or fluid coming from the puncture site. This information is not intended to replace advice given to you by your health care provider. Make sure you discuss any questions you have with your health care provider. Document Released: 05/13/2005 Document Revised: 02/06/2018 Document Reviewed: 04/06/2016 Elsevier Interactive Patient Education  2019 [employer].

## 2018-11-27 NOTE — H&P (Signed)
Referring Physician(s): Brunetta Genera  Supervising Physician: Aletta Edouard  Patient Status:  Jennifer [name] OP  Chief Complaint:  "I'm having a bone marrow biopsy"  Subjective: Patient familiar to IR service from prior iliac bone biopsy in 2016 as well as bone marrow biopsy on 03/01/2018. She has a history of multiple myeloma with recent rise in M spike and IgG levels concerning for disease progression.  She presents today for repeat CT-guided bone marrow biopsy for further evaluation.  He denies fever, headache, chest pain, dyspnea, cough, abdominal/back pain, nausea, vomiting or bleeding.  She does have some occasional knee pain, status post recent replacement.  Past Medical History:  Diagnosis Date  . Atrial fibrillation ([place]) 12/2010   PMH of ; [place] , [place] [facility]  . DJD (degenerative joint disease)   . History of blood transfusion 8185   complication after childbirth  . Osteoarthritis    "knees, hands, fingers, toes" (09/18/2018)  . Skin cancer of face 2015   Patient reports this was likely basal cell on the right side of her face needing 14 stitches.  . 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 Squamous cell carcinoma of scalp 2000   S/P MOHS   Past Surgical History:  Procedure Laterality Date  . CATARACT EXTRACTION W/ INTRAOCULAR LENS  IMPLANT, BILATERAL Bilateral ~ 2015  . CHOLECYSTECTOMY OPEN  1990's  . COLONOSCOPY  02/2013   negative X 3; Dr Olevia Perches  . G 3 P 1    . HAMMER TOE SURGERY Left 2018   2nd digit  . JOINT REPLACEMENT    . MOHS SURGERY  ~ 2009   "back of my head"  . SHOULDER ARTHROSCOPY W/ ROTATOR CUFF REPAIR Right 2012    Dr Durward Fortes  . SHOULDER ARTHROSCOPY W/ ROTATOR CUFF REPAIR Left 06/2014   Dr. Sydnee Cabal for rotator cuff  . TONSILLECTOMY AND ADENOIDECTOMY    . TOTAL KNEE ARTHROPLASTY Right 09/18/2018  . TOTAL KNEE ARTHROPLASTY Right 09/18/2018   Procedure: RIGHT TOTAL KNEE  ARTHROPLASTY;  Surgeon: Garald Balding, MD;  Location: [facility];  Service: Orthopedics;  Laterality: Right;  . WISDOM TOOTH EXTRACTION  1960s      Allergies: Patient has no known allergies.  Medications: Prior to Admission medications   Medication Sig Start Date End Date Taking? Authorizing Provider  aspirin 81 MG chewable tablet Chew 1 tablet (81 mg total) by mouth 2 (two) times daily. 09/19/18   Garald Balding, MD  B-Complex-C-Biotin-Minerals-FA (FOLBEE PLUS CZ) 5 MG TABS Take by mouth.    [provider]     Vital Signs: Blood pressure 154/79, heart rate 78, temperature 97.4, respirations 15, O2 sat 98% room air LMP 11/07/1996   Physical Exam awake, alert.  Chest clear to auscultation bilaterally.  Heart with regular rate and rhythm.  Abdomen soft, positive bowel sounds, nontender.  No lower extremity edema.  Imaging: No results found.  Labs:  CBC: Recent Labs    09/05/18 1306 09/19/18 0226 11/08/18 1431 11/27/18 0948  WBC 6.1 5.1 6.7 5.8  HGB 12.6 8.1* 12.3 13.0  HCT 38.3 25.3* 36.6 39.5  PLT 278 171 237 256    COAGS: Recent Labs    03/01/18 0930 09/05/18 1306  INR 0.98 1.03  APTT 28 27    BMP: Recent Labs    03/28/18 0841 09/05/18 1306 09/19/18 0226 11/08/18 1431  NA 139 137 134* 136  K 4.8  4.1 3.9 3.8  CL 106 106 106 102  CO2 _0 GLUCOSE 83 128* 135* 107*  BUN _1 CALCIUM 9.3 9.3 8.1* 9.5  CREATININE 0.94 0.83 1.18* 0.92  GFRNONAA 59* >60 44* >60  GFRAA >60 >60 51* >60    LIVER FUNCTION TESTS: Recent Labs    02/01/18 1151 03/28/18 0841 09/05/18 1306 11/08/18 1431  BILITOT 0.4 0.5 0.9 0.6  AST _2 ALT _3 ALKPHOS 59 65 45 53  PROT 9.3* 9.4* 9.2* 10.7*  ALBUMIN 3.5 3.7 3.4* 3.6    Assessment and Plan: Pt with history of multiple myeloma with recent rise in M spike and IgG levels concerning for disease progression.  She presents today for  CT-guided bone marrow biopsy for further  evaluation. Risks and benefits discussed with the patient /spouse including, but not limited to bleeding, infection, damage to adjacent structures or low yield requiring additional tests.  All of the patient's questions were answered, patient is agreeable to proceed. Consent signed and in chart.     Electronically Signed: D. Rowe Robert, PA-C 11/27/2018, 10:09 AM   I spent a total of 20 minutes at the the patient's bedside AND on the patient's hospital floor or unit, greater than 50% of which was counseling/coordinating care for CT-guided bone marrow biopsy

## 2018-11-27 NOTE — Procedures (Signed)
Interventional Radiology Procedure Note  Procedure: CT guided bone marrow aspiration and biopsy  Complications: None  EBL: < 10 mL  Findings: Aspirate and core biopsy performed of bone marrow in right iliac bone.  Plan: Bedrest supine x 1 hrs  Okie Jansson T. Tatsuo Musial, M.D Pager:  [phone]   

## 2018-11-28 DIAGNOSIS — M1711 Unilateral primary osteoarthritis, right knee: Secondary | ICD-10-CM | POA: Diagnosis not present

## 2018-11-28 DIAGNOSIS — Z471 Aftercare following joint replacement surgery: Secondary | ICD-10-CM | POA: Diagnosis not present

## 2018-11-29 ENCOUNTER — Ambulatory Visit (HOSPITAL_COMMUNITY): Payer: Medicare Other

## 2018-12-03 ENCOUNTER — Encounter: Payer: Self-pay | Admitting: Hematology

## 2018-12-03 NOTE — Telephone Encounter (Signed)
HI [place], Plz let patient know  PET/CT shows no bone lesions. Bone marrow biopsy shows progression from 20% to 46% plasma cells--not quite at 70% to meet criteria for active myeloma. Will need to setup clinic followup in about 2-3 weeks to discuss this in more details and discuss pros vs cons of initiating treatment. thx GK

## 2018-12-05 ENCOUNTER — Encounter: Payer: Self-pay | Admitting: Hematology

## 2018-12-07 ENCOUNTER — Encounter: Payer: Self-pay | Admitting: Hematology

## 2018-12-10 ENCOUNTER — Encounter (HOSPITAL_COMMUNITY): Payer: Self-pay | Admitting: Hematology

## 2018-12-10 ENCOUNTER — Telehealth: Payer: Self-pay | Admitting: *Deleted

## 2018-12-10 DIAGNOSIS — M1711 Unilateral primary osteoarthritis, right knee: Secondary | ICD-10-CM | POA: Diagnosis not present

## 2018-12-10 DIAGNOSIS — Z471 Aftercare following joint replacement surgery: Secondary | ICD-10-CM | POA: Diagnosis not present

## 2018-12-10 NOTE — Telephone Encounter (Signed)
Contacted patient per Dr. Cornell Barman PET/CT shows no bone lesions. Bone marrow biopsy shows progression, not enough to meet criteria for active myeloma. Please make appt in 2-3 weeks to discuss this in more details and discuss pros vs cons of initiating treatment.  Patient informed of above as requested. Patient verbalized understanding. Scheduling message sent to make appt for patient.

## 2018-12-11 ENCOUNTER — Telehealth: Payer: Self-pay | Admitting: Hematology

## 2018-12-11 NOTE — Telephone Encounter (Signed)
Scheduled appt per 2/3 sch message - pt is aware of appt date and time

## 2018-12-17 DIAGNOSIS — Z471 Aftercare following joint replacement surgery: Secondary | ICD-10-CM | POA: Diagnosis not present

## 2018-12-17 DIAGNOSIS — M1711 Unilateral primary osteoarthritis, right knee: Secondary | ICD-10-CM | POA: Diagnosis not present

## 2018-12-20 NOTE — Progress Notes (Signed)
HEMATOLOGY/ONCOLOGY CLINIC NOTE  Date of Service: 12/24/18      Patient Care Team: [name], MD as PCP - General (Family Medicine)  CHIEF COMPLAINTS/PURPOSE OF CONSULTATION:   Follow up for smoldering multiple myeloma  Diagnosis: Smoldering multiple myeloma  Treatment: Close monitoring  HISTORY OF PRESENTING ILLNESS: please see my initial consultation for details of her initial presentation  INTERVAL HISTORY  Ms Jennifer [name] is here for her scheduled interval follow-up of her smoldering multiple myeloma. The patient's last visit with [name] was on 11/08/18. She is accompanied today by her husband. The pt reports that she is doing well overall.   The pt reports that she feels very well and denies any fatigue. She notes that she continues to remain very active. She continues to compete actively in [employer]. She continues to have some pain in her right knee, and had a total right knee replacement in November 2019. She denies any pain along her neck or spine.  Of note since the patient's last visit, pt has had a PET/CT completed on 11/27/18 with results revealing No focal activity within the axillary appendicular skeleton to suggest active multiple myeloma. 2. No lytic skeletal lesions within the CT portion.  Lab results today (11/27/18) of CBC w/diff is as follows: all values are WNL.  On review of systems, pt reports good energy levels, staying active, right knee pain, and denies fatigue, pain along the spine, abdominal pains, and any other symptoms.   MEDICAL HISTORY:  Past Medical History:  Diagnosis Date  . Atrial fibrillation ([place]) 12/2010   PMH of ; [place] , [place] [facility]  . DJD (degenerative joint disease)   . History of blood transfusion 2202   complication after childbirth  . Osteoarthritis    "knees, hands, fingers, toes" (09/18/2018)  . PONV (postoperative nausea and vomiting) 09/2018   nausea only with knee surgery  . Skin cancer of face 2015   Patient  reports this was likely basal cell on the right side of her face needing 14 stitches.  . Smoldering multiple myeloma ([place]) 2016   "pre bone cancer; being monitored for this q 4 months or so" (09/18/2018)  . Squamous cell carcinoma of scalp 2000   S/P MOHS   . Patient Active Problem List   Diagnosis Date Noted  . Chronic left shoulder pain 11/14/2018  . History of total right knee replacement 10/17/2018  . Osteoarthritis of right knee 09/18/2018  . Primary osteoarthritis of both knees 11/30/2017  . Primary osteoarthritis of both feet 11/30/2017  . History of rotator cuff tear repair, bilateral 11/30/2017  . Primary osteoarthritis of right knee 08/23/2017  . Vitamin D deficiency 12/27/2015  . Smoldering multiple myeloma ([place]) 12/22/2015  . Hypergammaglobulinemia   . IgG monoclonal gammopathy of uncertain significance   . Abnormal gamma globulin level 04/14/2015  . Hyperlipidemia 04/03/2015  . Hyperglycemia 04/03/2015  . Diverticulosis of colon without hemorrhage 04/03/2015  . Arthralgia of multiple joints 04/03/2015  . Primary osteoarthritis of both hands 01/20/2010  . SKIN CANCER, HX OF 01/20/2010    SURGICAL HISTORY: Past Surgical History:  Procedure Laterality Date  . CATARACT EXTRACTION W/ INTRAOCULAR LENS  IMPLANT, BILATERAL Bilateral ~ 2015  . CHOLECYSTECTOMY OPEN  1990's  . COLONOSCOPY  02/2013   negative X 3; Dr Olevia Perches  . G 3 P 1    . HAMMER TOE SURGERY Left 2018   2nd digit  . JOINT REPLACEMENT    . MOHS SURGERY  ~  2009   "back of my head"  . SHOULDER ARTHROSCOPY W/ ROTATOR CUFF REPAIR Right 2012    Dr Whitfield  . SHOULDER ARTHROSCOPY W/ ROTATOR CUFF REPAIR Left 06/2014   Dr. Whitfied for rotator cuff  . TONSILLECTOMY AND ADENOIDECTOMY    . TOTAL KNEE ARTHROPLASTY Right 09/18/2018  . TOTAL KNEE ARTHROPLASTY Right 09/18/2018   Procedure: RIGHT TOTAL KNEE ARTHROPLASTY;  Surgeon: Whitfield, Peter W, MD;  Location: [facility] OR;  Service: Orthopedics;  Laterality: Right;    . WISDOM TOOTH EXTRACTION  1960s    SOCIAL HISTORY: Social History   Socioeconomic History  . Marital status: Married    Spouse name: Not on file  . Number of children: Not on file  . Years of education: Not on file  . Highest education level: Not on file  Occupational History  . Not on file  Social Needs  . Financial resource strain: Not on file  . Food insecurity:    Worry: Not on file    Inability: Not on file  . Transportation needs:    Medical: Not on file    Non-medical: Not on file  Tobacco Use  . Smoking status: Former Smoker    Packs/day: 0.25    Years: 9.00    Pack years: 2.25    Last attempt to quit: 11/07/1970    Years since quitting: 48.1  . Smokeless tobacco: Never Used  . Tobacco comment: 1/4 ppd 1963-1972  Substance and Sexual Activity  . Alcohol use: Yes    Alcohol/week: 3.0 standard drinks    Types: 3 Glasses of wine per week  . Drug use: Never  . Sexual activity: Not Currently    Partners: Male  Lifestyle  . Physical activity:    Days per week: Not on file    Minutes per session: Not on file  . Stress: Not on file  Relationships  . Social connections:    Talks on phone: Not on file    Gets together: Not on file    Attends religious service: Not on file    Active member of club or organization: Not on file    Attends meetings of clubs or organizations: Not on file    Relationship status: Not on file  . Intimate partner violence:    Fear of current or ex partner: Not on file    Emotionally abused: Not on file    Physically abused: Not on file    Forced sexual activity: Not on file  Other Topics Concern  . Not on file  Social History Narrative  . Not on file    FAMILY HISTORY: Family History  Problem Relation Age of Onset  . Atrial fibrillation Father   . Asthma Brother   . Alzheimer's disease Mother   . Myasthenia gravis Mother        ocular  . Breast cancer Paternal Grandmother   . Depression Daughter   . Colon cancer Neg Hx    . Rectal cancer Neg Hx   . Stomach cancer Neg Hx   . Diabetes Neg Hx   . Stroke Neg Hx   . Heart attack Neg Hx     ALLERGIES:  has No Known Allergies.  MEDICATIONS:  Current Outpatient Medications  Medication Sig Dispense Refill  . aspirin 81 MG chewable tablet Chew 1 tablet (81 mg total) by mouth 2 (two) times daily.    . B-Complex-C-Biotin-Minerals-FA (FOLBEE PLUS CZ) 5 MG TABS Take by mouth.     No current   facility-administered medications for this visit.     REVIEW OF SYSTEMS:    A 10+ POINT REVIEW OF SYSTEMS WAS OBTAINED including neurology, dermatology, psychiatry, cardiac, respiratory, lymph, extremities, GI, GU, Musculoskeletal, constitutional, breasts, reproductive, HEENT.  All pertinent positives are noted in the HPI.  All others are negative.   PHYSICAL EXAMINATION: ECOG PERFORMANCE STATUS: 0-1 .BP 136/66 (BP Location: Left Arm, Patient Position: Sitting)   Pulse 74   Temp (!) 97.5 F (36.4 C) (Oral)   Resp 18   Ht 5' 6" (1.676 m)   Wt 139 lb 6.4 oz (63.2 kg)   LMP 11/07/1996   SpO2 100%   BMI 22.50 kg/m   Filed Weights   12/24/18 1158  Weight: 139 lb 6.4 oz (63.2 kg)   .Body mass index is 22.5 kg/m.  GENERAL:alert, in no acute distress and comfortable SKIN: no acute rashes, no significant lesions EYES: conjunctiva are pink and non-injected, sclera anicteric OROPHARYNX: MMM, no exudates, no oropharyngeal erythema or ulceration NECK: supple, no JVD LYMPH:  no palpable lymphadenopathy in the cervical, axillary or inguinal regions LUNGS: clear to auscultation b/l with normal respiratory effort HEART: regular rate & rhythm ABDOMEN:  normoactive bowel sounds , non tender, not distended. No palpable hepatosplenomegaly.  Extremity: no pedal edema PSYCH: alert & oriented x 3 with fluent speech NEURO: no focal motor/sensory deficits   LABORATORY DATA:  I have reviewed the data as listed  . CBC Latest Ref Rng & Units 11/27/2018 11/08/2018 09/19/2018  WBC  4.0 - 10.5 K/uL 5.8 6.7 5.1  Hemoglobin 12.0 - 15.0 g/dL 13.0 12.3 8.1(L)  Hematocrit 36.0 - 46.0 % 39.5 36.6 25.3(L)  Platelets 150 - 400 K/uL 256 237 171   . CBC    Component Value Date/Time   WBC 5.8 11/27/2018 0948   RBC 3.97 11/27/2018 0948   HGB 13.0 11/27/2018 0948   HGB 12.8 03/28/2018 0841   HGB 12.4 10/26/2017 1002   HCT 39.5 11/27/2018 0948   HCT 36.2 10/26/2017 1002   PLT 256 11/27/2018 0948   PLT 261 03/28/2018 0841   PLT 235 10/26/2017 1002   MCV 99.5 11/27/2018 0948   MCV 96.0 10/26/2017 1002   MCH 32.7 11/27/2018 0948   MCHC 32.9 11/27/2018 0948   RDW 12.5 11/27/2018 0948   RDW 12.7 10/26/2017 1002   LYMPHSABS 2.9 11/27/2018 0948   LYMPHSABS 2.4 10/26/2017 1002   MONOABS 0.2 11/27/2018 0948   MONOABS 0.2 10/26/2017 1002   EOSABS 0.1 11/27/2018 0948   EOSABS 0.1 10/26/2017 1002   BASOSABS 0.0 11/27/2018 0948   BASOSABS 0.0 10/26/2017 1002    . CMP Latest Ref Rng & Units 11/08/2018 09/19/2018 09/05/2018  Glucose 70 - 99 mg/dL 107(H) 135(H) 128(H)  BUN 8 - 23 mg/dL 16 21 12  Creatinine 0.44 - 1.00 mg/dL 0.92 1.18(H) 0.83  Sodium 135 - 145 mmol/L 136 134(L) 137  Potassium 3.5 - 5.1 mmol/L 3.8 3.9 4.1  Chloride 98 - 111 mmol/L 102 106 106  CO2 22 - 32 mmol/L 24 25 24  Calcium 8.9 - 10.3 mg/dL 9.5 8.1(L) 9.3  Total Protein 6.5 - 8.1 g/dL 10.7(H) - 9.2(H)  Total Bilirubin 0.3 - 1.2 mg/dL 0.6 - 0.9  Alkaline Phos 38 - 126 U/L 53 - 45  AST 15 - 41 U/L 17 - 22  ALT 0 - 44 U/L 18 - 21         03/01/18 Cytogenetics:   03/01/18 Bx:     11/27/18 BM   Bx:     11/27/18 Cytogenetics:      RADIOGRAPHIC STUDIES: I have personally reviewed the radiological images as listed and agreed with the findings in the report. Nm Pet Image Restage (ps) Whole Body  Result Date: 11/28/2018 CLINICAL DATA:  Initial treatment strategy for multiple myeloma. EXAM: NUCLEAR MEDICINE PET WHOLE BODY TECHNIQUE: 6.3 mCi F-18 FDG was injected intravenously. Full-ring PET  imaging was performed from the skull base to thigh after the radiotracer. CT data was obtained and used for attenuation correction and anatomic localization. Fasting blood glucose: Three 83 mg/dl COMPARISON:  FDG PET scan 03/02/2018 FINDINGS: Mediastinal blood pool activity: SUV max 1.9 HEAD/NECK: No hypermetabolic activity in the scalp. No hypermetabolic cervical lymph nodes. Incidental CT findings: none CHEST: No hypermetabolic mediastinal or hilar nodes. No suspicious pulmonary nodules on the CT scan. Incidental CT findings: none ABDOMEN/PELVIS: No abnormal hypermetabolic activity within the liver, pancreas, adrenal glands, or spleen. No hypermetabolic lymph nodes in the abdomen or pelvis. Incidental CT findings: none SKELETON: No focal activity in the axillary or appendicular skeleton to suggest active multiple myeloma. Incidental CT findings: No lytic lesions on bone survey. EXTREMITIES: Uptake associated with the RIGHT knee prosthetic consists with inflammatory metabolic activity. Movement of the lower extremities below the knee on the PET imaging degrades images. No lesions evident on the CT portion. Incidental CT findings: No lytic lesions within the extremities. IMPRESSION: 1. No focal activity within the axillary appendicular skeleton to suggest active multiple myeloma. 2. No lytic skeletal lesions within the CT portion. Electronically Signed   By: Stewart  Edmunds M.D.   On: 11/28/2018 08:51   Ct Biopsy  Result Date: 11/27/2018 CLINICAL DATA:  History of multiple myeloma with recent increase in IgG, total protein, M protein and lambda free light chains. Bone marrow biopsy needed for further evaluation. EXAM: CT GUIDED BONE MARROW ASPIRATION AND BIOPSY ANESTHESIA/SEDATION: Versed 2.0 mg IV, Fentanyl 100 mcg IV Total Moderate Sedation Time:   14 minutes. The patient's level of consciousness and physiologic status were continuously monitored during the procedure by Radiology nursing. PROCEDURE: The  procedure risks, benefits, and alternatives were explained to the patient. Questions regarding the procedure were encouraged and answered. The patient understands and consents to the procedure. A time out was performed prior to initiating the procedure. The right gluteal region was prepped with chlorhexidine. Sterile gown and sterile gloves were used for the procedure. Local anesthesia was provided with 1% Lidocaine. Under CT guidance, an 11 gauge On Control bone cutting needle was advanced from a posterior approach into the right iliac bone. Needle positioning was confirmed with CT. Initial non heparinized and heparinized aspirate samples were obtained of bone marrow. Core biopsy was performed via the On Control drill needle. COMPLICATIONS: None FINDINGS: Inspection of initial aspirate did reveal visible particles. Intact core biopsy sample was obtained. IMPRESSION: CT guided bone marrow biopsy of right posterior iliac bone with both aspirate and core samples obtained. Electronically Signed   By: Glenn  Yamagata M.D.   On: 11/27/2018 12:43   Ct Bone Marrow Biopsy & Aspiration  Result Date: 11/27/2018 CLINICAL DATA:  History of multiple myeloma with recent increase in IgG, total protein, M protein and lambda free light chains. Bone marrow biopsy needed for further evaluation. EXAM: CT GUIDED BONE MARROW ASPIRATION AND BIOPSY ANESTHESIA/SEDATION: Versed 2.0 mg IV, Fentanyl 100 mcg IV Total Moderate Sedation Time:   14 minutes. The patient's level of consciousness and physiologic status were continuously monitored during the procedure by Radiology   nursing. PROCEDURE: The procedure risks, benefits, and alternatives were explained to the patient. Questions regarding the procedure were encouraged and answered. The patient understands and consents to the procedure. A time out was performed prior to initiating the procedure. The right gluteal region was prepped with chlorhexidine. Sterile gown and sterile gloves were  used for the procedure. Local anesthesia was provided with 1% Lidocaine. Under CT guidance, an 11 gauge On Control bone cutting needle was advanced from a posterior approach into the right iliac bone. Needle positioning was confirmed with CT. Initial non heparinized and heparinized aspirate samples were obtained of bone marrow. Core biopsy was performed via the On Control drill needle. COMPLICATIONS: None FINDINGS: Inspection of initial aspirate did reveal visible particles. Intact core biopsy sample was obtained. IMPRESSION: CT guided bone marrow biopsy of right posterior iliac bone with both aspirate and core samples obtained. Electronically Signed   By: Glenn  Yamagata M.D.   On: 11/27/2018 12:43   Normal DEXA scan in June 2015  Cytogenetic analysis: Revealed the presence of a normal female chromosomes with no observable clonal chromosomal abnormalities.   ASSESSMENT & PLAN:   72 y.o. Caucasian female in good overall health with  #1Smoldering Multiple Myeloma IgG lambda. Bone marrow plasma cells more than 10% ( noted to have 35% clonal plasma cells] Normal female karyotype cytogenetics with no other abnormalities SPEP shows M spike of 2.2 (slightly decrease/stabiliyt from previous levels) with IFE showing IgG lambda paraprotein. Associated with some Immunoparesis characterized by associated decrease in IgA and IgM.  M protein 2.6--> 2.6---> 2.2--->2.4--->2.3--> 2.6-->2.4->2.5--> 2.2--->3 -->2.7-->3.9  03/02/18 PET/CT revealed No hypermetabolic osseous lesion on today's study. No hypermetabolic soft tissue disease in the neck, chest, abdomen, pelvis, or lower extremities   03/05/18 cytogenetics report revealed that her plasma cells make up 35% of her cells.    PLAN:  -Discussed pt labwork from 11/27/18; blood counts all normal from CBC w/diff -Last available 11/08/18 MMP revealed M Protein increased from 2.9 to 4.1. Last available 11/08/18 SFLC revealed Lambda increased to 33.8, Kappa stable at  6.7, and decreased K:L ratio of 0.20. -Discussed the 11/27/18 PET/CT which revealed No focal activity within the axillary appendicular skeleton to suggest active multiple myeloma. 2. No lytic skeletal lesions within the CT portion.  -Discussed the 11/27/18 BM Bx which revealed slightly hypercellular bone marrow with plasma cell neoplasm and plasma cells comprising about 46% of all cells -Pt still without CRAB criteria, as she as normal calcium levels, normal renal function, no anemia and no bone tumors. -Discussed that I am concerned that the pt is approaching active disease due to the increase in her M spike, increase in plasma cell involvement in the BM to 46%, and newly abnormal K:L light chain ratio. -Discussed the options of considering initiating treatment vs continuing close watchful observation -Discussed the risks and potential benefits of Revlimid and the associated possibility of delaying disease progression, without understanding yet of overall survival impact or transplant considerations due to questions of stem cell mobilization -Discussed the potential side effects of Revlimid including fatigue and blood clots- pt has never had a blood clot. -Pt has no symptoms and denies fatigue. She continues to enjoy a very active lifestyle and a very good quality of life. -At the very least, I recommend closer follow up moving forward as the disease is showing signs of changing character, which the pt prefers to do, as opposed to beginning treatment now. -Continue Vitamin D replacement 2000 units daily -Will repeat labs   in 4 weeks, and pt will let me know if she develops any unexplained fatigue, persistent and new focal bone pains, or other new concerns in the interim before our next visit on 03/11/19 -Will see the pt back sooner than 03/11/19 if labs in one month suggest doing so   -Labs in 4 weeks -Please schedule labs 7-10 days prior to followup clinic appointment on 03/11/2019   All off the patients  questions were answered to her apparent satisfaction. The patient knows to call the clinic with any problems, questions or concerns.  The total time spent in the appt was 45 minutes and more than 50% was on counseling and direct patient cares.    Sullivan Lone MD [place] AAHIVMS [facility] Hematology/Oncology Physician [place]  (Office):       [phone] (Work cell):  [phone] (Fax):           [phone]  I, Baldwin [place], am acting as a scribe for Dr. Sullivan Lone.   .I have reviewed the above documentation for accuracy and completeness, and I agree with the above. Brunetta Genera MD

## 2018-12-24 ENCOUNTER — Inpatient Hospital Stay: Payer: Medicare Other | Attending: Hematology | Admitting: Hematology

## 2018-12-24 ENCOUNTER — Telehealth: Payer: Self-pay | Admitting: Hematology

## 2018-12-24 VITALS — BP 136/66 | HR 74 | Temp 97.5°F | Resp 18 | Ht 66.0 in | Wt 139.4 lb

## 2018-12-24 DIAGNOSIS — Z1231 Encounter for screening mammogram for malignant neoplasm of breast: Secondary | ICD-10-CM | POA: Diagnosis not present

## 2018-12-24 DIAGNOSIS — D472 Monoclonal gammopathy: Secondary | ICD-10-CM

## 2018-12-24 DIAGNOSIS — C9000 Multiple myeloma not having achieved remission: Secondary | ICD-10-CM | POA: Diagnosis not present

## 2018-12-24 DIAGNOSIS — Z87891 Personal history of nicotine dependence: Secondary | ICD-10-CM

## 2018-12-24 NOTE — Telephone Encounter (Signed)
Scheduled appt per 2/17 los. ° °Printed calendar and avs. °

## 2018-12-25 ENCOUNTER — Encounter: Payer: Self-pay | Admitting: Hematology

## 2018-12-27 DIAGNOSIS — Z471 Aftercare following joint replacement surgery: Secondary | ICD-10-CM | POA: Diagnosis not present

## 2018-12-27 DIAGNOSIS — M1711 Unilateral primary osteoarthritis, right knee: Secondary | ICD-10-CM | POA: Diagnosis not present

## 2018-12-28 ENCOUNTER — Telehealth: Payer: Self-pay | Admitting: *Deleted

## 2018-12-28 NOTE — Telephone Encounter (Signed)
Contacted patient in response to question submitted on Mychart regarding referral for Stem Cell treatment/transplant. Per Dr. Irene Limbo, please inform patient he will not be able to provide a referral for Stem Cell Transplant as it is not appropriate at this time in her treatment. If patient would like a second opinion, he will send any records requested of him for that. She verbalized understanding of Dr. Grier Mitts answer. Patient stated that the topic was discussed in the [place] appointment, but misunderstood and thought it was in the current treatment plan. Patient states she does not plan to seek a second opinion at this time and is very satisfied with her current care.

## 2019-01-02 ENCOUNTER — Ambulatory Visit (INDEPENDENT_AMBULATORY_CARE_PROVIDER_SITE_OTHER): Payer: Medicare Other | Admitting: Orthopaedic Surgery

## 2019-01-02 ENCOUNTER — Encounter (INDEPENDENT_AMBULATORY_CARE_PROVIDER_SITE_OTHER): Payer: Self-pay | Admitting: Orthopaedic Surgery

## 2019-01-02 VITALS — BP 112/56 | HR 61 | Ht 67.0 in | Wt 136.0 lb

## 2019-01-02 DIAGNOSIS — Z96651 Presence of right artificial knee joint: Secondary | ICD-10-CM

## 2019-01-02 NOTE — Progress Notes (Signed)
Office Visit Note   Patient: Jennifer [name]           Date of Birth: 08-15-1946           MRN: [id number] Visit Date: 01/02/2019              Requested by: [name], MD No address on file PCP: [name], MD   Assessment & Plan: Visit Diagnoses:  1. History of total right knee replacement     Plan: 4 months status post primary right total knee replacement and doing well.  Still lacks a few degrees to full extension but is very happy with the results.  She is back to playing pickle ball.  Also had a cortisone injection about 6 weeks ago in the left shoulder notes that that is helping.  She is had a prior MRI scan revealing recurrent small rotator cuff tear.  Overall she is doing very well and happy with the results I have encouraged her to continue working with strengthening exercises but tickly the quadriceps muscles and I will see her back in 6 months  Follow-Up Instructions: Return in about 6 months (around 07/03/2019).   Orders:  No orders of the defined types were placed in this encounter.  No orders of the defined types were placed in this encounter.     Procedures: No procedures performed   Clinical Data: No additional findings.   Subjective: Chief Complaint  Patient presents with  . Right Knee - Follow-up    Right TKA DOS 09/18/18  . Left Shoulder - Follow-up  Patient presents today for follow up on her right knee arthroplasty. She is now 3 months post op and doing well. She finished therapy last week. She said that she cannot fully extend her knee. She does not take anything for pain except an occasional Aspirin. She also is here today for a recheck on her left shoulder. She said that it is doing well since the cortisone injection 7 weeks ago. She states that it has helped and has minimal tenderness now.  HPI  Review of Systems   Objective: Vital Signs: BP (!) 112/56   Pulse 61   Ht '5\' 7"'$  (1.702 m)   Wt 136 lb (61.7 kg)   LMP 11/07/1996   BMI  21.30 kg/m   Physical Exam Constitutional:      Appearance: She is well-developed.  Eyes:     Pupils: Pupils are equal, round, and reactive to light.  Pulmonary:     Effort: Pulmonary effort is normal.  Skin:    General: Skin is warm and dry.  Neurological:     Mental Status: She is alert and oriented to person, place, and time.  Psychiatric:        Behavior: Behavior normal.     Ortho Exam awake alert and oriented x3 comfortable sitting.  I measured about 5 degrees lack of full lower right knee extension.  Very minimal effusion.  Knee was slightly warm compared to the left knee but no significant pain.  No opening with a valgus stress.  Little opening may be several millimeters with a varus stress laterally and about a 2 mm anterior drawer sign with a good stop.  No calf pain no distal edema neurovascular exam intact.  No popliteal discomfort. Left shoulder with quick overhead motion and negative impingement.  Good strength   Specialty Comments:  No specialty comments available.  Imaging: No results found.   PMFS History: Patient Active Problem List  Diagnosis Date Noted  . Chronic left shoulder pain 11/14/2018  . History of total right knee replacement 10/17/2018  . Osteoarthritis of right knee 09/18/2018  . Primary osteoarthritis of both knees 11/30/2017  . Primary osteoarthritis of both feet 11/30/2017  . History of rotator cuff tear repair, bilateral 11/30/2017  . Primary osteoarthritis of right knee 08/23/2017  . Vitamin D deficiency 12/27/2015  . Smoldering multiple myeloma ([place]) 12/22/2015  . Hypergammaglobulinemia   . IgG monoclonal gammopathy of uncertain significance   . Abnormal gamma globulin level 04/14/2015  . Hyperlipidemia 04/03/2015  . Hyperglycemia 04/03/2015  . Diverticulosis of colon without hemorrhage 04/03/2015  . Arthralgia of multiple joints 04/03/2015  . Primary osteoarthritis of both hands 01/20/2010  . SKIN CANCER, HX OF 01/20/2010    Past Medical History:  Diagnosis Date  . Atrial fibrillation ([place]) 12/2010   PMH of ; [place] , [place] [facility]  . DJD (degenerative joint disease)   . History of blood transfusion 6712   complication after childbirth  . Osteoarthritis    "knees, hands, fingers, toes" (09/18/2018)  . PONV (postoperative nausea and vomiting) 09/2018   nausea only with knee surgery  . Skin cancer of face 2015   Patient reports this was likely basal cell on the right side of her face needing 14 stitches.  . Smoldering multiple myeloma ([place]) 2016   "pre bone cancer; being monitored for this q 4 months or so" (09/18/2018)  . Squamous cell carcinoma of scalp 2000   S/P MOHS    Family History  Problem Relation Age of Onset  . Atrial fibrillation Father   . Asthma Brother   . Alzheimer's disease Mother   . Myasthenia gravis Mother        ocular  . Breast cancer Paternal Grandmother   . Depression Daughter   . Colon cancer Neg Hx   . Rectal cancer Neg Hx   . Stomach cancer Neg Hx   . Diabetes Neg Hx   . Stroke Neg Hx   . Heart attack Neg Hx     Past Surgical History:  Procedure Laterality Date  . CATARACT EXTRACTION W/ INTRAOCULAR LENS  IMPLANT, BILATERAL Bilateral ~ 2015  . CHOLECYSTECTOMY OPEN  1990's  . COLONOSCOPY  02/2013   negative X 3; Dr Olevia Perches  . G 3 P 1    . HAMMER TOE SURGERY Left 2018   2nd digit  . JOINT REPLACEMENT    . MOHS SURGERY  ~ 2009   "back of my head"  . SHOULDER ARTHROSCOPY W/ ROTATOR CUFF REPAIR Right 2012    Dr Durward Fortes  . SHOULDER ARTHROSCOPY W/ ROTATOR CUFF REPAIR Left 06/2014   Dr. Sydnee Cabal for rotator cuff  . TONSILLECTOMY AND ADENOIDECTOMY    . TOTAL KNEE ARTHROPLASTY Right 09/18/2018  . TOTAL KNEE ARTHROPLASTY Right 09/18/2018   Procedure: RIGHT TOTAL KNEE ARTHROPLASTY;  Surgeon: Garald Balding, MD;  Location: [facility];  Service: Orthopedics;  Laterality: Right;  . WISDOM TOOTH EXTRACTION  1960s   Social History   Occupational History  . Not on file   Tobacco Use  . Smoking status: Former Smoker    Packs/day: 0.25    Years: 9.00    Pack years: 2.25    Last attempt to quit: 11/07/1970    Years since quitting: 48.1  . Smokeless tobacco: Never Used  . Tobacco comment: 1/4 ppd [id number]  Substance and Sexual Activity  . Alcohol use: Yes    Alcohol/week: 3.0 standard drinks  Types: 3 Glasses of wine per week  . Drug use: Never  . Sexual activity: Not Currently    Partners: Male

## 2019-01-07 DIAGNOSIS — Z23 Encounter for immunization: Secondary | ICD-10-CM | POA: Diagnosis not present

## 2019-01-07 DIAGNOSIS — K5730 Diverticulosis of large intestine without perforation or abscess without bleeding: Secondary | ICD-10-CM | POA: Diagnosis not present

## 2019-01-07 DIAGNOSIS — Z1231 Encounter for screening mammogram for malignant neoplasm of breast: Secondary | ICD-10-CM | POA: Diagnosis not present

## 2019-01-07 DIAGNOSIS — Z13820 Encounter for screening for osteoporosis: Secondary | ICD-10-CM | POA: Diagnosis not present

## 2019-01-07 DIAGNOSIS — E785 Hyperlipidemia, unspecified: Secondary | ICD-10-CM | POA: Diagnosis not present

## 2019-01-07 DIAGNOSIS — J321 Chronic frontal sinusitis: Secondary | ICD-10-CM | POA: Diagnosis not present

## 2019-01-07 DIAGNOSIS — C9000 Multiple myeloma not having achieved remission: Secondary | ICD-10-CM | POA: Diagnosis not present

## 2019-01-07 DIAGNOSIS — Z7689 Persons encountering health services in other specified circumstances: Secondary | ICD-10-CM | POA: Diagnosis not present

## 2019-01-07 DIAGNOSIS — Z1239 Encounter for other screening for malignant neoplasm of breast: Secondary | ICD-10-CM | POA: Diagnosis not present

## 2019-01-07 DIAGNOSIS — Z85828 Personal history of other malignant neoplasm of skin: Secondary | ICD-10-CM | POA: Diagnosis not present

## 2019-01-10 ENCOUNTER — Telehealth: Payer: Self-pay | Admitting: *Deleted

## 2019-01-10 NOTE — Telephone Encounter (Signed)
Medical records faxed to [facility]; [place] [phone]

## 2019-01-21 ENCOUNTER — Other Ambulatory Visit: Payer: Self-pay

## 2019-01-21 ENCOUNTER — Inpatient Hospital Stay: Payer: Medicare Other | Attending: Hematology

## 2019-01-21 DIAGNOSIS — C9000 Multiple myeloma not having achieved remission: Secondary | ICD-10-CM | POA: Insufficient documentation

## 2019-01-21 DIAGNOSIS — D472 Monoclonal gammopathy: Secondary | ICD-10-CM

## 2019-01-21 LAB — CBC WITH DIFFERENTIAL/PLATELET
Abs Immature Granulocytes: 0.02 10*3/uL (ref 0.00–0.07)
Basophils Absolute: 0 10*3/uL (ref 0.0–0.1)
Basophils Relative: 0 %
Eosinophils Absolute: 0 10*3/uL (ref 0.0–0.5)
Eosinophils Relative: 1 %
HCT: 31.6 % — ABNORMAL LOW (ref 36.0–46.0)
Hemoglobin: 10.5 g/dL — ABNORMAL LOW (ref 12.0–15.0)
Immature Granulocytes: 0 %
Lymphocytes Relative: 38 %
Lymphs Abs: 2.1 10*3/uL (ref 0.7–4.0)
MCH: 33 pg (ref 26.0–34.0)
MCHC: 33.2 g/dL (ref 30.0–36.0)
MCV: 99.4 fL (ref 80.0–100.0)
Monocytes Absolute: 0.2 10*3/uL (ref 0.1–1.0)
Monocytes Relative: 3 %
Neutro Abs: 3.2 10*3/uL (ref 1.7–7.7)
Neutrophils Relative %: 58 %
Platelets: 297 10*3/uL (ref 150–400)
RBC: 3.18 MIL/uL — ABNORMAL LOW (ref 3.87–5.11)
RDW: 12.8 % (ref 11.5–15.5)
WBC: 5.5 10*3/uL (ref 4.0–10.5)
nRBC: 0 % (ref 0.0–0.2)

## 2019-01-21 LAB — CMP (CANCER CENTER ONLY)
ALT: 18 U/L (ref 0–44)
AST: 15 U/L (ref 15–41)
Albumin: 3.3 g/dL — ABNORMAL LOW (ref 3.5–5.0)
Alkaline Phosphatase: 53 U/L (ref 38–126)
Anion gap: 11 (ref 5–15)
BUN: 21 mg/dL (ref 8–23)
CO2: 23 mmol/L (ref 22–32)
Calcium: 8.8 mg/dL — ABNORMAL LOW (ref 8.9–10.3)
Chloride: 103 mmol/L (ref 98–111)
Creatinine: 0.92 mg/dL (ref 0.44–1.00)
GFR, Est AFR Am: >60 mL/min (ref >60)
GFR, Estimated: >60 mL/min (ref >60)
Glucose, Bld: 91 mg/dL (ref 70–99)
Potassium: 3.9 mmol/L (ref 3.5–5.1)
Sodium: 137 mmol/L (ref 135–145)
Total Bilirubin: 0.4 mg/dL (ref 0.3–1.2)
Total Protein: 10 g/dL — ABNORMAL HIGH (ref 6.5–8.1)

## 2019-01-22 LAB — MULTIPLE MYELOMA PANEL, SERUM
Albumin SerPl Elph-Mcnc: 3.9 g/dL (ref 2.9–4.4)
Albumin/Glob SerPl: 0.7 (ref 0.7–1.7)
Alpha 1: 0.3 g/dL (ref 0.0–0.4)
Alpha2 Glob SerPl Elph-Mcnc: 0.7 g/dL (ref 0.4–1.0)
B-Globulin SerPl Elph-Mcnc: 0.9 g/dL (ref 0.7–1.3)
Gamma Glob SerPl Elph-Mcnc: 3.8 g/dL — ABNORMAL HIGH (ref 0.4–1.8)
Globulin, Total: 5.7 g/dL — ABNORMAL HIGH (ref 2.2–3.9)
IgA: 15 mg/dL — ABNORMAL LOW (ref 64–422)
IgG (Immunoglobin G), Serum: 5171 mg/dL — ABNORMAL HIGH (ref 700–1600)
IgM (Immunoglobulin M), Srm: 6 mg/dL — ABNORMAL LOW (ref 26–217)
M Protein SerPl Elph-Mcnc: 3.6 g/dL — ABNORMAL HIGH
Total Protein ELP: 9.6 g/dL — ABNORMAL HIGH (ref 6.0–8.5)

## 2019-01-22 LAB — KAPPA/LAMBDA LIGHT CHAINS
Kappa free light chain: 7.4 mg/L (ref 3.3–19.4)
Kappa, lambda light chain ratio: 0.18 — ABNORMAL LOW (ref 0.26–1.65)
Lambda free light chains: 41.4 mg/L — ABNORMAL HIGH (ref 5.7–26.3)

## 2019-01-28 ENCOUNTER — Encounter: Payer: Self-pay | Admitting: Hematology

## 2019-01-29 ENCOUNTER — Telehealth: Payer: Self-pay | Admitting: *Deleted

## 2019-01-29 NOTE — Telephone Encounter (Signed)
Contacted patient in response to my chart question regarding Myeloma Panel results. Per Dr. Irene Limbo, would like patient to make appt now instead of waiting for next scheduled pt appt 5/4. Appt available 3/27 at 9:20. Patient will come then. Msg sent to scheduling.

## 2019-01-30 ENCOUNTER — Encounter: Payer: Self-pay | Admitting: Hematology

## 2019-01-31 NOTE — Progress Notes (Signed)
HEMATOLOGY/ONCOLOGY CLINIC NOTE  Date of Service: 02/01/19      Patient Care Team: [name], MD as PCP - General (Family Medicine)  CHIEF COMPLAINTS/PURPOSE OF CONSULTATION:   Follow up for smoldering multiple myeloma  Diagnosis: Smoldering multiple myeloma  Treatment: Close monitoring  HISTORY OF PRESENTING ILLNESS: please see my initial consultation for details of her initial presentation  INTERVAL HISTORY  Jennifer [name] is visited today via a Phone visit, utilized for [employer] prevention strategies, regarding her scheduled interval follow-up of her Multiple Myeloma. The patient's last visit with [name] was on 12/24/18. She is accompanied today by her husband. The pt reports that she is doing well overall.  The pt reports that she has not developed any new concerns in the interim. The pt denies any concerns for blood loss in the interim. The pt notes that her ankles and lower back have been "more sensitive." She notes that her lower back is "throbbing more than if has before." She has continued to "play quite a bit of pickle ball."  Lab results (01/21/19) of CBC w/diff and CMP is as follows: all values are WNL except for RBC at 3.18, HGB at 10.5, HCT at 31.6, Calcium at 8.8, Total Protein at 10.0, Albumin at 3.3. 01/21/19 MMP revealed all values WNL except for IgG at 5171, IgA at 15, IgM at 6, Total Protein elp at 9.6, Gamma glob at 3.8, M Protein at 3.6, Globulin total at 5.7 01/21/19 SFLC revealed Kappa at 7.4, Lambda at 41.4, and K:L ratio of 0.18.  On review of systems, pt reports staying active, worsened lower back pain, good energy levels, and denies concerns for blood loss, concerns for infections, and any other symptoms.   MEDICAL HISTORY:  Past Medical History:  Diagnosis Date  . Atrial fibrillation ([place]) 12/2010   PMH of ; [place] , [place] [facility]  . DJD (degenerative joint disease)   . History of blood transfusion 1696   complication after childbirth  .  Osteoarthritis    "knees, hands, fingers, toes" (09/18/2018)  . PONV (postoperative nausea and vomiting) 09/2018   nausea only with knee surgery  . Skin cancer of face 2015   Patient reports this was likely basal cell on the right side of her face needing 14 stitches.  . Smoldering multiple myeloma ([place]) 2016   "pre bone cancer; being monitored for this q 4 months or so" (09/18/2018)  . Squamous cell carcinoma of scalp 2000   S/P MOHS   . Patient Active Problem List   Diagnosis Date Noted  . Chronic left shoulder pain 11/14/2018  . History of total right knee replacement 10/17/2018  . Osteoarthritis of right knee 09/18/2018  . Primary osteoarthritis of both knees 11/30/2017  . Primary osteoarthritis of both feet 11/30/2017  . History of rotator cuff tear repair, bilateral 11/30/2017  . Primary osteoarthritis of right knee 08/23/2017  . Vitamin D deficiency 12/27/2015  . Smoldering multiple myeloma ([place]) 12/22/2015  . Hypergammaglobulinemia   . IgG monoclonal gammopathy of uncertain significance   . Abnormal gamma globulin level 04/14/2015  . Hyperlipidemia 04/03/2015  . Hyperglycemia 04/03/2015  . Diverticulosis of colon without hemorrhage 04/03/2015  . Arthralgia of multiple joints 04/03/2015  . Primary osteoarthritis of both hands 01/20/2010  . SKIN CANCER, HX OF 01/20/2010    SURGICAL HISTORY: Past Surgical History:  Procedure Laterality Date  . CATARACT EXTRACTION W/ INTRAOCULAR LENS  IMPLANT, BILATERAL Bilateral ~ 2015  . CHOLECYSTECTOMY OPEN  1990's  .  COLONOSCOPY  02/2013   negative X 3; Dr Olevia Perches  . G 3 P 1    . HAMMER TOE SURGERY Left 2018   2nd digit  . JOINT REPLACEMENT    . MOHS SURGERY  ~ 2009   "back of my head"  . SHOULDER ARTHROSCOPY W/ ROTATOR CUFF REPAIR Right 2012    Dr Durward Fortes  . SHOULDER ARTHROSCOPY W/ ROTATOR CUFF REPAIR Left 06/2014   Dr. Sydnee Cabal for rotator cuff  . TONSILLECTOMY AND ADENOIDECTOMY    . TOTAL KNEE ARTHROPLASTY Right  09/18/2018  . TOTAL KNEE ARTHROPLASTY Right 09/18/2018   Procedure: RIGHT TOTAL KNEE ARTHROPLASTY;  Surgeon: Garald Balding, MD;  Location: [facility];  Service: Orthopedics;  Laterality: Right;  . WISDOM TOOTH EXTRACTION  1960s    SOCIAL HISTORY: Social History   Socioeconomic History  . Marital status: Married    Spouse name: Not on file  . Number of children: Not on file  . Years of education: Not on file  . Highest education level: Not on file  Occupational History  . Not on file  Social Needs  . Financial resource strain: Not on file  . Food insecurity:    Worry: Not on file    Inability: Not on file  . Transportation needs:    Medical: Not on file    Non-medical: Not on file  Tobacco Use  . Smoking status: Former Smoker    Packs/day: 0.25    Years: 9.00    Pack years: 2.25    Last attempt to quit: 11/07/1970    Years since quitting: 48.2  . Smokeless tobacco: Never Used  . Tobacco comment: 1/4 ppd [id number]  Substance and Sexual Activity  . Alcohol use: Yes    Alcohol/week: 3.0 standard drinks    Types: 3 Glasses of wine per week  . Drug use: Never  . Sexual activity: Not Currently    Partners: Male  Lifestyle  . Physical activity:    Days per week: Not on file    Minutes per session: Not on file  . Stress: Not on file  Relationships  . Social connections:    Talks on phone: Not on file    Gets together: Not on file    Attends religious service: Not on file    Active member of club or organization: Not on file    Attends meetings of clubs or organizations: Not on file    Relationship status: Not on file  . Intimate partner violence:    Fear of current or ex partner: Not on file    Emotionally abused: Not on file    Physically abused: Not on file    Forced sexual activity: Not on file  Other Topics Concern  . Not on file  Social History Narrative  . Not on file    FAMILY HISTORY: Family History  Problem Relation Age of Onset  . Atrial fibrillation  Father   . Asthma Brother   . Alzheimer's disease Mother   . Myasthenia gravis Mother        ocular  . Breast cancer Paternal Grandmother   . Depression Daughter   . Colon cancer Neg Hx   . Rectal cancer Neg Hx   . Stomach cancer Neg Hx   . Diabetes Neg Hx   . Stroke Neg Hx   . Heart attack Neg Hx     ALLERGIES:  has No Known Allergies.  MEDICATIONS:  Current Outpatient Medications  Medication Sig Dispense  Refill  . aspirin 81 MG chewable tablet Chew 1 tablet (81 mg total) by mouth 2 (two) times daily.    Marland Kitchen B-Complex-C-Biotin-Minerals-FA (FOLBEE PLUS CZ) 5 MG TABS Take by mouth.     No current facility-administered medications for this visit.     REVIEW OF SYSTEMS:    A 10+ POINT REVIEW OF SYSTEMS WAS OBTAINED including neurology, dermatology, psychiatry, cardiac, respiratory, lymph, extremities, GI, GU, Musculoskeletal, constitutional, breasts, reproductive, HEENT.  All pertinent positives are noted in the HPI.  All others are negative.   PHYSICAL EXAMINATION: ECOG PERFORMANCE STATUS: 0-1 .LMP 11/07/1996   There were no vitals filed for this visit. .There is no height or weight on file to calculate BMI.  No Physical Exam performed, per phone visit.  LABORATORY DATA:  I have reviewed the data as listed  . CBC Latest Ref Rng & Units 01/21/2019 11/27/2018 11/08/2018  WBC 4.0 - 10.5 K/uL 5.5 5.8 6.7  Hemoglobin 12.0 - 15.0 g/dL 10.5(L) 13.0 12.3  Hematocrit 36.0 - 46.0 % 31.6(L) 39.5 36.6  Platelets 150 - 400 K/uL 297 256 237   . CBC    Component Value Date/Time   WBC 5.5 01/21/2019 1348   RBC 3.18 (L) 01/21/2019 1348   HGB 10.5 (L) 01/21/2019 1348   HGB 12.8 03/28/2018 0841   HGB 12.4 10/26/2017 1002   HCT 31.6 (L) 01/21/2019 1348   HCT 36.2 10/26/2017 1002   PLT 297 01/21/2019 1348   PLT 261 03/28/2018 0841   PLT 235 10/26/2017 1002   MCV 99.4 01/21/2019 1348   MCV 96.0 10/26/2017 1002   MCH 33.0 01/21/2019 1348   MCHC 33.2 01/21/2019 1348   RDW 12.8  01/21/2019 1348   RDW 12.7 10/26/2017 1002   LYMPHSABS 2.1 01/21/2019 1348   LYMPHSABS 2.4 10/26/2017 1002   MONOABS 0.2 01/21/2019 1348   MONOABS 0.2 10/26/2017 1002   EOSABS 0.0 01/21/2019 1348   EOSABS 0.1 10/26/2017 1002   BASOSABS 0.0 01/21/2019 1348   BASOSABS 0.0 10/26/2017 1002    . CMP Latest Ref Rng & Units 01/21/2019 11/08/2018 09/19/2018  Glucose 70 - 99 mg/dL 91 107(H) 135(H)  BUN 8 - 23 mg/dL '21 16 21  '$ Creatinine 0.44 - 1.00 mg/dL 0.92 0.92 1.18(H)  Sodium 135 - 145 mmol/L 137 136 134(L)  Potassium 3.5 - 5.1 mmol/L 3.9 3.8 3.9  Chloride 98 - 111 mmol/L 103 102 106  CO2 22 - 32 mmol/L '23 24 25  '$ Calcium 8.9 - 10.3 mg/dL 8.8(L) 9.5 8.1(L)  Total Protein 6.5 - 8.1 g/dL 10.0(H) 10.7(H) -  Total Bilirubin 0.3 - 1.2 mg/dL 0.4 0.6 -  Alkaline Phos 38 - 126 U/L 53 53 -  AST 15 - 41 U/L 15 17 -  ALT 0 - 44 U/L 18 18 -         03/01/18 Cytogenetics:   03/01/18 Bx:     11/27/18 BM Bx:     11/27/18 Cytogenetics:      RADIOGRAPHIC STUDIES: I have personally reviewed the radiological images as listed and agreed with the findings in the report. No results found. Normal DEXA scan in June 2015  Cytogenetic analysis: Revealed the presence of a normal female chromosomes with no observable clonal chromosomal abnormalities.   ASSESSMENT & PLAN:   73 y.o. Caucasian female in good overall health with  #1Smoldering Multiple Myeloma IgG lambda. Bone marrow plasma cells more than 10% ( noted to have 35% clonal plasma cells] Normal female karyotype cytogenetics with no  other abnormalities SPEP shows M spike of 2.2 (slightly decrease/stabiliyt from previous levels) with IFE showing IgG lambda paraprotein. Associated with some Immunoparesis characterized by associated decrease in IgA and IgM.  M protein 2.6--> 2.6---> 2.2--->2.4--->2.3--> 2.6-->2.4->2.5--> 2.2--->3 -->2.7-->3.9  03/02/18 PET/CT revealed No hypermetabolic osseous lesion on today's study. No hypermetabolic  soft tissue disease in the neck, chest, abdomen, pelvis, or lower extremities   03/05/18 cytogenetics report revealed that her plasma cells make up 35% of her cells.    11/27/18 BM Bx revealed slightly hypercellular bone marrow with plasma cell neoplasm and plasma cells comprising about 46% of all cells  11/27/18 PET/CT revealed No focal activity within the axillary appendicular skeleton to suggest active multiple myeloma. 2. No lytic skeletal lesions within the CT portion.   PLAN:  -Discussed pt labwork from 01/21/19; new anemia with HGB decreased from 13.0 to 10.5. M Protein slightly reduced to 3.6. Free lambda light chains increased to 41.'4mg'$ . Creatinine normal at 0.92, no hypercalcemia with Calcium at 8.8. IgG elevated at 5171. -Discussed that as the pt's M spike has recently increased, she has had increase in plasma cell involvement in the BM to 46%, newly abnormal K:L light chain ratio, and with her new anemia, she now meets one feature of the CRAB criteria, and therefore an indication to consider initiating treatment. -Last genetics revealed translocation 11;14, consistent with standard risk. Pt does not have medically limiting factors to choice of treatment. -Discussed the recommended options for treatment including weekly Dexamethasone and a proteosome inhibitor such as Ninlaro or Velcade. Would plan to add an immunomodulator such as Revlimid beginning in C2 if pt displays tolerance to C1. -Discussed that induction treatment would last 4-8 cycles, and would seek to lower her M spike, followed by either maintenance treatment vs autologous BM transplant. -Discussed the potential side effects of treatment including fatigue, neuropathy, diarrhea, nausea, shingles, and blood clots- pt has never had a blood clot. -Discussed that we will reduce patient's risk of Shingles with Acyclovir BID, and reduce risk of blood clots with '81mg'$  Aspirin -Provided supplemental information to pt's email -Continue  Vitamin D replacement 2000 units daily -Will order Bone Density study for consideration of injectable osteoporosis medication -Will tentatively begin C1 Dexamethasone and Ninlaro in 4 weeks -Will see the pt back in 4 weeks   RTC with Dr Irene Limbo in 2 weeks with labs   All off the patients questions were answered to her apparent satisfaction. The patient knows to call the clinic with any problems, questions or concerns.  The total time spent in the appt was 50 minutes and more than 50% was on counseling and direct patient cares.    Sullivan Lone MD [place] [facility] Hematology/Oncology Physician [place]  (Office):       [phone] (Work cell):  [phone] (Fax):           [phone]  I, Baldwin [place], am acting as a scribe for Dr. Sullivan Lone.   .I have reviewed the above documentation for accuracy and completeness, and I agree with the above.  Brunetta Genera MD

## 2019-02-01 ENCOUNTER — Telehealth: Payer: Self-pay | Admitting: Hematology

## 2019-02-01 ENCOUNTER — Inpatient Hospital Stay (HOSPITAL_BASED_OUTPATIENT_CLINIC_OR_DEPARTMENT_OTHER): Payer: Medicare Other | Admitting: Hematology

## 2019-02-01 DIAGNOSIS — C9000 Multiple myeloma not having achieved remission: Secondary | ICD-10-CM | POA: Diagnosis not present

## 2019-02-01 NOTE — Telephone Encounter (Signed)
Called and scheduled appt per 3/27 los.  Patient aware of appt date and time.

## 2019-02-07 ENCOUNTER — Encounter: Payer: Self-pay | Admitting: Hematology

## 2019-02-12 ENCOUNTER — Other Ambulatory Visit: Payer: Self-pay | Admitting: Hematology

## 2019-02-12 MED ORDER — IXAZOMIB CITRATE 4 MG PO CAPS: 4.0000 mg | ORAL_CAPSULE | ORAL | 3 refills | Status: DC

## 2019-02-13 ENCOUNTER — Telehealth: Payer: Self-pay | Admitting: *Deleted

## 2019-02-13 ENCOUNTER — Telehealth: Payer: Self-pay

## 2019-02-13 ENCOUNTER — Telehealth: Payer: Self-pay | Admitting: Pharmacist

## 2019-02-13 NOTE — Telephone Encounter (Signed)
Oral Oncology Patient Advocate Encounter  Received notification from [facility] that prior authorization for Jennifer [name] is required.  PA submitted on CoverMyMeds Key [id number] Status is pending  Oral Oncology Clinic will continue to follow.  [place] Patient [name] Phone [phone] Fax [phone] 02/13/2019    9:13 AM

## 2019-02-13 NOTE — Telephone Encounter (Signed)
Oral Oncology Patient Advocate Encounter  Prior Authorization for Kennieth Rad has been approved.    PA# [id number] Effective dates: 11/14/18 through 02/13/20  Oral Oncology Clinic will continue to follow.   [place] Patient [place] Phone [phone] Fax [phone] 02/13/2019    1:09 PM

## 2019-02-13 NOTE — Telephone Encounter (Signed)
Attempted to contact patient regarding currently scheduled appt on Friday 4/10 at 10:40. Left voice mail: Per Dr. Irene Limbo, after review of chart, if patient is in agreement, can reschedule appt and come in 2-4 weeks. Asked patient to contact office.

## 2019-02-13 NOTE — Telephone Encounter (Signed)
Oral Oncology Pharmacist Encounter  Received new prescription for Ninlaro (ixazomib) for the treatment of multiple myeloma not having achieved remission in conjunction with dexamethasone, Revlimimd will be added with cycle 2 pending toleration to cycle 1, planned duration until adequate disease control or unacceptable toxicity.  Original diagnosis of smoldering myeloma in late 2016 and patient has been under active surveillance since that time. M-spike is increasing as well and plasma cell % in bone marrow aspirate Patient is under evaluation to initiate treatment with Ninlaro at 60m given once weekly for 3 weeks on, 1 week off and repeated every 4 weeks  Labs from 01/21/19 assessed, OK for treatment initiation. SCr=0.92, round to 1.0 for age > 6107 est CrCl ~ 50 mL/min No dose adjustment recommended by manufacturer with CrCl > 30 mL/min  Current medication list in Epic reviewed, no DDIs with Ninlaro identified.  Patient medication list reflects she is currently on aspirin 886monce daily, will confirm patient remains on this important medication for thromboprophylaxis in anticipation of Revlimid start  Acyclovir is not noted on medication list, will follow-up with MD for agent for VZV prophylaxis. No anti-emetic noted on medication list, some incidence of N/V with Ninlaro use, will follow up with MD for anti-emetic Rx  [facility].  Oral Oncology Clinic will continue to follow for insurance authorization, copayment issues, initial counseling and start date.  JeJohny DrillingPharmD, BCPS, BCOP  02/13/2019 8:28 AM Oral Oncology Clinic [phone]

## 2019-02-13 NOTE — Telephone Encounter (Signed)
Patient called office. Informed that Dr.Kale is ok with postponing lab/MD appt for 2 weeks. During this time, pharmacist is working to obtain Biochemist, clinical for [employer]. Patient is going to attempt to make bone density appt on same day as lab/MD appt to minimize travel, once notified of appt date by scheduling. Appts for 4/10 cancelled. Msg sent to scheduling. Patient verbalizes understanding and expects call.

## 2019-02-15 ENCOUNTER — Other Ambulatory Visit: Payer: Medicare Other

## 2019-02-15 ENCOUNTER — Ambulatory Visit: Payer: Medicare Other | Admitting: Hematology

## 2019-02-18 ENCOUNTER — Telehealth: Payer: Self-pay | Admitting: Hematology

## 2019-02-18 NOTE — Telephone Encounter (Signed)
[name] [name] Jennifer [name] will decide if appointments after 4/23 should be cancelled or remain.

## 2019-02-27 NOTE — Progress Notes (Signed)
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  HISTORY OF PRESENTING ILLNESS: please see my initial consultation for details of her initial presentation  INTERVAL HISTORY  Ms Jennifer [name] returns today for management and evaluation of her Multiple Myeloma. The patient's last visit with [name] was on 02/01/19. The pt reports that she is doing well overall.  The pt reports that she has not felt any differently since our last visit. She notes that she has remained active in the interim with pickle ball and golf and is enjoying good energy levels and a good quality of life. The pt notes that she does not feel any different currently as compared to the previous 6-12 months.  Lab results today (02/28/19) of CBC w/diff and CMP is as follows: all values are WNL except for RBC at 3.73, Glucose at 109, Total Protein at 10.9, GFR at 56. 02/28/19 MMP and SFLC are pending  On review of systems, pt reports stable energy levels, eating well, staying active, and denies abdominal pains, pain along the spine, leg swelling, and any other symptoms.   MEDICAL HISTORY:  Past Medical History:  Diagnosis Date  . Atrial fibrillation ([place]) 12/2010   PMH of ; [place] , [place] [facility]  . DJD (degenerative joint disease)   . History of blood transfusion 2297   complication after childbirth  . Osteoarthritis    "knees, hands, fingers, toes" (09/18/2018)  . PONV (postoperative nausea and vomiting) 09/2018   nausea only with knee surgery  . Skin cancer of face 2015   Patient reports this was likely basal cell on the right side of her face needing 14 stitches.  . Smoldering multiple myeloma ([place]) 2016   "pre  bone cancer; being monitored for this q 4 months or so" (09/18/2018)  . Squamous cell carcinoma of scalp 2000   S/P MOHS   . Patient Active Problem List   Diagnosis Date Noted  . Chronic left shoulder pain 11/14/2018  . History of total right knee replacement 10/17/2018  . Osteoarthritis of right knee 09/18/2018  . Primary osteoarthritis of both knees 11/30/2017  . Primary osteoarthritis of both feet 11/30/2017  . History of rotator cuff tear repair, bilateral 11/30/2017  . Primary osteoarthritis of right knee 08/23/2017  . Vitamin D deficiency 12/27/2015  . Smoldering multiple myeloma ([place]) 12/22/2015  . Hypergammaglobulinemia   . IgG monoclonal gammopathy of uncertain significance   . Abnormal gamma globulin level 04/14/2015  . Hyperlipidemia 04/03/2015  . Hyperglycemia 04/03/2015  . Diverticulosis of colon without hemorrhage 04/03/2015  . Arthralgia of multiple joints 04/03/2015  . Primary osteoarthritis of both hands 01/20/2010  . SKIN CANCER, HX OF 01/20/2010    SURGICAL HISTORY: Past Surgical History:  Procedure Laterality Date  . CATARACT EXTRACTION W/ INTRAOCULAR LENS  IMPLANT, BILATERAL Bilateral ~ 2015  . CHOLECYSTECTOMY OPEN  1990's  . COLONOSCOPY  02/2013   negative X 3; Dr Olevia Perches  . G 3 P 1    . HAMMER TOE SURGERY Left 2018   2nd digit  . JOINT REPLACEMENT    . MOHS SURGERY  ~ 2009   "back of my head"  . SHOULDER ARTHROSCOPY W/ ROTATOR CUFF REPAIR Right  2012    Dr Durward Fortes  . SHOULDER ARTHROSCOPY W/ ROTATOR CUFF REPAIR Left 06/2014   Dr. Sydnee Cabal for rotator cuff  . TONSILLECTOMY AND ADENOIDECTOMY    . TOTAL KNEE ARTHROPLASTY Right 09/18/2018  . TOTAL KNEE ARTHROPLASTY Right 09/18/2018   Procedure: RIGHT TOTAL KNEE ARTHROPLASTY;  Surgeon: Garald Balding, MD;  Location: [facility];  Service: Orthopedics;  Laterality: Right;  . WISDOM TOOTH EXTRACTION  1960s    SOCIAL HISTORY: Social History   Socioeconomic History  . Marital status: Married    Spouse  name: Not on file  . Number of children: Not on file  . Years of education: Not on file  . Highest education level: Not on file  Occupational History  . Not on file  Social Needs  . Financial resource strain: Not on file  . Food insecurity:    Worry: Not on file    Inability: Not on file  . Transportation needs:    Medical: Not on file    Non-medical: Not on file  Tobacco Use  . Smoking status: Former Smoker    Packs/day: 0.25    Years: 9.00    Pack years: 2.25    Last attempt to quit: 11/07/1970    Years since quitting: 48.3  . Smokeless tobacco: Never Used  . Tobacco comment: 1/4 ppd [id number]  Substance and Sexual Activity  . Alcohol use: Yes    Alcohol/week: 3.0 standard drinks    Types: 3 Glasses of wine per week  . Drug use: Never  . Sexual activity: Not Currently    Partners: Male  Lifestyle  . Physical activity:    Days per week: Not on file    Minutes per session: Not on file  . Stress: Not on file  Relationships  . Social connections:    Talks on phone: Not on file    Gets together: Not on file    Attends religious service: Not on file    Active member of club or organization: Not on file    Attends meetings of clubs or organizations: Not on file    Relationship status: Not on file  . Intimate partner violence:    Fear of current or ex partner: Not on file    Emotionally abused: Not on file    Physically abused: Not on file    Forced sexual activity: Not on file  Other Topics Concern  . Not on file  Social History Narrative  . Not on file    FAMILY HISTORY: Family History  Problem Relation Age of Onset  . Atrial fibrillation Father   . Asthma Brother   . Alzheimer's disease Mother   . Myasthenia gravis Mother        ocular  . Breast cancer Paternal Grandmother   . Depression Daughter   . Colon cancer Neg Hx   . Rectal cancer Neg Hx   . Stomach cancer Neg Hx   . Diabetes Neg Hx   . Stroke Neg Hx   . Heart attack Neg Hx     ALLERGIES:  has  No Known Allergies.  MEDICATIONS:  Current Outpatient Medications  Medication Sig Dispense Refill  . aspirin 81 MG chewable tablet Chew 1 tablet (81 mg total) by mouth 2 (two) times daily.    Marland Kitchen B-Complex-C-Biotin-Minerals-FA (FOLBEE PLUS CZ) 5 MG TABS Take by mouth.    . ixazomib citrate (NINLARO) 4 MG capsule Take 1 capsule (4 mg total) by mouth once a week. Take on  an empty stomach 1hr before or 2hrs after food on Day 1,8,15 every 28 days. 3 capsule 3   No current facility-administered medications for this visit.     REVIEW OF SYSTEMS:    A 10+ POINT REVIEW OF SYSTEMS WAS OBTAINED including neurology, dermatology, psychiatry, cardiac, respiratory, lymph, extremities, GI, GU, Musculoskeletal, constitutional, breasts, reproductive, HEENT.  All pertinent positives are noted in the HPI.  All others are negative.   PHYSICAL EXAMINATION: ECOG PERFORMANCE STATUS: 0-1 .BP (!) 144/65 (BP Location: Right Arm, Patient Position: Sitting) Comment: Notified Nurse of BP  Pulse 68   Temp 98.2 F (36.8 C) (Oral)   Resp 18   Ht _0  (1.702 m)   Wt 142 lb 4.8 oz (64.5 kg)   LMP 11/07/1996   SpO2 100%   BMI 22.29 kg/m   Filed Weights   02/28/19 1142  Weight: 142 lb 4.8 oz (64.5 kg)   .Body mass index is 22.29 kg/m.  GENERAL:alert, in no acute distress and comfortable SKIN: no acute rashes, no significant lesions EYES: conjunctiva are pink and non-injected, sclera anicteric OROPHARYNX: MMM, no exudates, no oropharyngeal erythema or ulceration NECK: supple, no JVD LYMPH:  no palpable lymphadenopathy in the cervical, axillary or inguinal regions LUNGS: clear to auscultation b/l with normal respiratory effort HEART: regular rate & rhythm ABDOMEN:  normoactive bowel sounds , non tender, not distended. No palpable hepatosplenomegaly.  Extremity: no pedal edema PSYCH: alert & oriented x 3 with fluent speech NEURO: no focal motor/sensory deficits   LABORATORY DATA:  I have reviewed the data  as listed  . CBC Latest Ref Rng & Units 02/28/2019 01/21/2019 11/27/2018  WBC 4.0 - 10.5 K/uL 5.7 5.5 5.8  Hemoglobin 12.0 - 15.0 g/dL 12.4 10.5(L) 13.0  Hematocrit 36.0 - 46.0 % 37.1 31.6(L) 39.5  Platelets 150 - 400 K/uL 234 297 256   . CBC    Component Value Date/Time   WBC 5.7 02/28/2019 1131   RBC 3.73 (L) 02/28/2019 1131   HGB 12.4 02/28/2019 1131   HGB 12.8 03/28/2018 0841   HGB 12.4 10/26/2017 1002   HCT 37.1 02/28/2019 1131   HCT 36.2 10/26/2017 1002   PLT 234 02/28/2019 1131   PLT 261 03/28/2018 0841   PLT 235 10/26/2017 1002   MCV 99.5 02/28/2019 1131   MCV 96.0 10/26/2017 1002   MCH 33.2 02/28/2019 1131   MCHC 33.4 02/28/2019 1131   RDW 12.2 02/28/2019 1131   RDW 12.7 10/26/2017 1002   LYMPHSABS 2.8 02/28/2019 1131   LYMPHSABS 2.4 10/26/2017 1002   MONOABS 0.2 02/28/2019 1131   MONOABS 0.2 10/26/2017 1002   EOSABS 0.1 02/28/2019 1131   EOSABS 0.1 10/26/2017 1002   BASOSABS 0.0 02/28/2019 1131   BASOSABS 0.0 10/26/2017 1002    . CMP Latest Ref Rng & Units 02/28/2019 01/21/2019 11/08/2018  Glucose 70 - 99 mg/dL 109(H) 91 107(H)  BUN 8 - 23 mg/dL _1 Creatinine 0.44 - 1.00 mg/dL 0.99 0.92 0.92  Sodium 135 - 145 mmol/L 136 137 136  Potassium 3.5 - 5.1 mmol/L 4.7 3.9 3.8  Chloride 98 - 111 mmol/L 104 103 102  CO2 22 - 32 mmol/L _2 Calcium 8.9 - 10.3 mg/dL 9.2 8.8(L) 9.5  Total Protein 6.5 - 8.1 g/dL 10.9(H) 10.0(H) 10.7(H)  Total Bilirubin 0.3 - 1.2 mg/dL 0.4 0.4 0.6  Alkaline Phos 38 - 126 U/L 65 53 53  AST 15 - 41 U/L _3 ALT  0 - 44 U/L _0 03/01/18 Cytogenetics:   03/01/18 Bx:     11/27/18 BM Bx:     11/27/18 Cytogenetics:      RADIOGRAPHIC STUDIES: I have personally reviewed the radiological images as listed and agreed with the findings in the report. No results found. Normal DEXA scan in June 2015  Cytogenetic analysis: Revealed the presence of a normal female chromosomes with no observable clonal  chromosomal abnormalities.   ASSESSMENT & PLAN:   73 y.o. Caucasian female in good overall health with  #1Smoldering Multiple Myeloma IgG lambda. Bone marrow plasma cells more than 10% ( noted to have 35% clonal plasma cells] Normal female karyotype cytogenetics with no other abnormalities SPEP shows M spike of 2.2 (slightly decrease/stabiliyt from previous levels) with IFE showing IgG lambda paraprotein. Associated with some Immunoparesis characterized by associated decrease in IgA and IgM.  M protein 2.6--> 2.6---> 2.2--->2.4--->2.3--> 2.6-->2.4->2.5--> 2.2--->3 -->2.7-->3.9-->4.1  03/02/18 PET/CT revealed No hypermetabolic osseous lesion on today's study. No hypermetabolic soft tissue disease in the neck, chest, abdomen, pelvis, or lower extremities   03/05/18 cytogenetics report revealed that her plasma cells make up 35% of her cells.    11/27/18 BM Bx revealed slightly hypercellular bone marrow with plasma cell neoplasm and plasma cells comprising about 46% of all cells  11/27/18 PET/CT revealed No focal activity within the axillary appendicular skeleton to suggest active multiple myeloma. 2. No lytic skeletal lesions within the CT portion.   Last genetics revealed translocation 11;14, consistent with standard risk.   PLAN:  -Discussed pt labwork today, 02/28/19; HGB improved to 12.4 from 10.5 last month. No other cytopenias. Total Protein increased to 10.9g. Creatinine remains normal at 0.99. Calcium remains normal at 9.55m. -02/28/19 MMP and SFLC are pending. Last available MMP from 01/21/19 IgG at 5171, M Protein at 3.6g. Last available SFLC from 01/21/19 revealed Lambda at 41.41mand K:L ratio of 0.18. -Discussed that the patient's anemia interestingly resolved in the interim, without having begun treatment. She is currently enjoying a very good quality of life. However, pt's M spike has recently increased along with new Lambda light chain abnormality and the loss of Kappa:Lambda light  chain ratio preservation. If plasma cells >60% this alone would be an independent criteria to begin treatment; pt's last involvement was seen to about 50% and close to criteria of 60%. Discussed all of these considerations, in the context of her goals of care, and the desire to strike a balance between preserving her quality of life and preventing deleterious effects from increasing myeloma activity. Discussed that in this setting, the exact timing to initiate treatment does not have an exact indication in her case. -patient has decided to proceed with treatment. Willbegin Ninlaro+Dexamethasone. -if tolerated will add Revlimid in 1-2 cycles. I discussed that I would like to see her within 2 weeks of her beginning these medications for a toxicity check. -Would look to add Revlimid during C2 if initial tolerance is displayed. -Discussed that induction treatment would last 4-8 cycles, and would seek to lower her M spike to atleast VGPR status, followed by either maintenance treatment vs autologous BM transplant. -Continue Acyclovir BID and 8158mspirin -Continue Vitamin D replacement 2000 units daily -Proceed with Bone Density study for consideration of injectable osteoporosis medication  F/u in clinic In 2weeks after starting her Ninlaro+Dex with labs for toxicity check  All off the patients questions were answered to her apparent satisfaction. The patient knows  to call the clinic with any problems, questions or concerns.  The total time spent in the appt was 40 minutes and more than 50% was on counseling and direct patient cares.    Sullivan Lone MD [place] AAHIVMS [facility] Hematology/Oncology Physician [place]  (Office):       [phone] (Work cell):  [phone] (Fax):           [phone]   I, Baldwin [place], am acting as a scribe for Dr. Sullivan Lone.   .I have reviewed the above documentation for accuracy and completeness, and I agree with the above. Brunetta Genera MD

## 2019-02-28 ENCOUNTER — Inpatient Hospital Stay: Payer: Medicare Other | Attending: Hematology | Admitting: Hematology

## 2019-02-28 ENCOUNTER — Inpatient Hospital Stay: Payer: Medicare Other

## 2019-02-28 ENCOUNTER — Other Ambulatory Visit: Payer: Self-pay

## 2019-02-28 VITALS — BP 144/65 | HR 68 | Temp 98.2°F | Resp 18 | Ht 67.0 in | Wt 142.3 lb

## 2019-02-28 DIAGNOSIS — C9000 Multiple myeloma not having achieved remission: Secondary | ICD-10-CM

## 2019-02-28 DIAGNOSIS — Z7982 Long term (current) use of aspirin: Secondary | ICD-10-CM | POA: Diagnosis not present

## 2019-02-28 DIAGNOSIS — Z87891 Personal history of nicotine dependence: Secondary | ICD-10-CM | POA: Diagnosis not present

## 2019-02-28 DIAGNOSIS — D472 Monoclonal gammopathy: Secondary | ICD-10-CM

## 2019-02-28 LAB — CMP (CANCER CENTER ONLY)
ALT: 22 U/L (ref 0–44)
AST: 19 U/L (ref 15–41)
Albumin: 3.5 g/dL (ref 3.5–5.0)
Alkaline Phosphatase: 65 U/L (ref 38–126)
Anion gap: 9 (ref 5–15)
BUN: 22 mg/dL (ref 8–23)
CO2: 23 mmol/L (ref 22–32)
Calcium: 9.2 mg/dL (ref 8.9–10.3)
Chloride: 104 mmol/L (ref 98–111)
Creatinine: 0.99 mg/dL (ref 0.44–1.00)
GFR, Est AFR Am: >60 mL/min (ref >60)
GFR, Estimated: 56 mL/min — ABNORMAL LOW (ref >60)
Glucose, Bld: 109 mg/dL — ABNORMAL HIGH (ref 70–99)
Potassium: 4.7 mmol/L (ref 3.5–5.1)
Sodium: 136 mmol/L (ref 135–145)
Total Bilirubin: 0.4 mg/dL (ref 0.3–1.2)
Total Protein: 10.9 g/dL — ABNORMAL HIGH (ref 6.5–8.1)

## 2019-02-28 LAB — CBC WITH DIFFERENTIAL/PLATELET
Abs Immature Granulocytes: 0.01 10*3/uL (ref 0.00–0.07)
Basophils Absolute: 0 10*3/uL (ref 0.0–0.1)
Basophils Relative: 0 %
Eosinophils Absolute: 0.1 10*3/uL (ref 0.0–0.5)
Eosinophils Relative: 1 %
HCT: 37.1 % (ref 36.0–46.0)
Hemoglobin: 12.4 g/dL (ref 12.0–15.0)
Immature Granulocytes: 0 %
Lymphocytes Relative: 49 %
Lymphs Abs: 2.8 10*3/uL (ref 0.7–4.0)
MCH: 33.2 pg (ref 26.0–34.0)
MCHC: 33.4 g/dL (ref 30.0–36.0)
MCV: 99.5 fL (ref 80.0–100.0)
Monocytes Absolute: 0.2 10*3/uL (ref 0.1–1.0)
Monocytes Relative: 3 %
Neutro Abs: 2.7 10*3/uL (ref 1.7–7.7)
Neutrophils Relative %: 47 %
Platelets: 234 10*3/uL (ref 150–400)
RBC: 3.73 MIL/uL — ABNORMAL LOW (ref 3.87–5.11)
RDW: 12.2 % (ref 11.5–15.5)
WBC: 5.7 10*3/uL (ref 4.0–10.5)
nRBC: 0 % (ref 0.0–0.2)

## 2019-03-01 ENCOUNTER — Telehealth: Payer: Self-pay | Admitting: *Deleted

## 2019-03-01 ENCOUNTER — Telehealth: Payer: Self-pay | Admitting: Hematology

## 2019-03-01 ENCOUNTER — Encounter: Payer: Self-pay | Admitting: Hematology

## 2019-03-01 LAB — MULTIPLE MYELOMA PANEL, SERUM
Albumin SerPl Elph-Mcnc: 4.2 g/dL (ref 2.9–4.4)
Albumin/Glob SerPl: 0.7 (ref 0.7–1.7)
Alpha 1: 0.3 g/dL (ref 0.0–0.4)
Alpha2 Glob SerPl Elph-Mcnc: 0.7 g/dL (ref 0.4–1.0)
B-Globulin SerPl Elph-Mcnc: 1 g/dL (ref 0.7–1.3)
Gamma Glob SerPl Elph-Mcnc: 4.3 g/dL — ABNORMAL HIGH (ref 0.4–1.8)
Globulin, Total: 6.2 g/dL — ABNORMAL HIGH (ref 2.2–3.9)
IgA: 12 mg/dL — ABNORMAL LOW (ref 64–422)
IgG (Immunoglobin G), Serum: 5924 mg/dL — ABNORMAL HIGH (ref 586–1602)
IgM (Immunoglobulin M), Srm: 5 mg/dL — ABNORMAL LOW (ref 26–217)
M Protein SerPl Elph-Mcnc: 4.1 g/dL — ABNORMAL HIGH
Total Protein ELP: 10.4 g/dL — ABNORMAL HIGH (ref 6.0–8.5)

## 2019-03-01 LAB — KAPPA/LAMBDA LIGHT CHAINS
Kappa free light chain: 8.7 mg/L (ref 3.3–19.4)
Kappa, lambda light chain ratio: 0.18 — ABNORMAL LOW (ref 0.26–1.65)
Lambda free light chains: 48.3 mg/L — ABNORMAL HIGH (ref 5.7–26.3)

## 2019-03-01 NOTE — Telephone Encounter (Signed)
Opened in error

## 2019-03-01 NOTE — Telephone Encounter (Signed)
No los per 4/23. °

## 2019-03-01 NOTE — Telephone Encounter (Signed)
Attempted to contact patient r/t MyChart message regarding Ninlaro. Asked patient to contact Dr. Grier Mitts office at her convenience as Dr. Irene Limbo wanted to verify that patient wants to begin taking Ninlaro.  Advised that [facility] Oral Oncology pharmacist will speak with her prior to starting medication to discuss taking medication, use of supporting medications and how/where medication is obtained. Prescription is on file at [facility] at this time. Once Ms. Fusaro notifies this office of her intent to begin [place], pharmacist will be notified to contact her.

## 2019-03-04 ENCOUNTER — Telehealth: Payer: Self-pay | Admitting: Pharmacist

## 2019-03-04 ENCOUNTER — Inpatient Hospital Stay: Payer: Medicare Other

## 2019-03-04 ENCOUNTER — Telehealth: Payer: Self-pay

## 2019-03-04 DIAGNOSIS — C9000 Multiple myeloma not having achieved remission: Secondary | ICD-10-CM

## 2019-03-04 NOTE — Telephone Encounter (Signed)
Oral Oncology Pharmacist Encounter  Received call from patient this morning with questions about Ninlaro start.  She states that she is ready to initiate Ninlaro (ixazomib) for the treatment of multiple myeloma not having achieved remission in conjunction with dexamethasone, planned duration until adequate disease control or unacceptable toxicity.  Patient informed Revlimd will be added with cycle 2 pending toleration to cycle 1  I originally worked up patient for new start on 02/13/2019, however, was put on hold pending additional conversations between patient and MD.  Kennieth Rad is planned to be administered at 98m given once weekly for 3 weeks on, 1 week off and repeated every 4 weeks Dexamethasone prescription has not yet been sent, I have spoken with MD about this and it will be sent to patient's local pharmacy today, 03/04/19.  Labs repeated on 02/28/19 and assessed, OK for treatment initiation. SCr stable at 0.99, round to 1.0 for age > 665 est CrCl ~ 50 mL/min No dose adjustment recommended by manufacturer with CrCl > 30 mL/min  Current medication list in Epic reviewed, no DDIs with Ninlaro identified.  Patient medication list reflects she is currently on aspirin 874monce daily, will confirm patient remains on this important medication for thromboprophylaxis in anticipation of Revlimid start  Discussed prescriptions for acyclovir and anti-emetic with MD. He will send Rx's to patient's Local pharmacy when he send dexamethasone prescription.  [facility] and approval by MD on 02/13/19. [facility] and they can be made ready for pick-up. We will help patient coordinate site of dispensing based on patient's preference.  Insurance authorization has previously been obtained. Oral oncology patient  advocate will screen patient for eligibility for foundation copayment grant to cover out of pocket expenses for Ninlaro, and eventually Revlimid, at dispensing pharmacy.  Oral Oncology Clinic will continue to follow for copayment issues, supportive therapy medications, medication acquisition, initial counseling and start date.  JeJohny DrillingPharmD, BCPS, BCOP  03/04/2019  11:05 AM  Oral Oncology Clinic [phone]

## 2019-03-04 NOTE — Telephone Encounter (Signed)
Oral Oncology Patient Advocate Encounter  Was successful in securing patient a $11,000 grant from [employer] to provide copayment coverage for [employer]. This will keep the out of pocket expense at $0.   I have spoken with the patient..   The billing information is as follows and has been shared with [place].   Member ID: [id number]   Group ID: [id number] RxBin: [id number] Dates of Eligibility: 02/02/19 through 02/01/20  [place] Patient [place] Phone [phone] Fax [phone] 03/04/2019    3:42 PM

## 2019-03-05 ENCOUNTER — Other Ambulatory Visit: Payer: Self-pay | Admitting: Hematology

## 2019-03-05 MED ORDER — ACYCLOVIR 400 MG PO TABS: 400.0000 mg | ORAL_TABLET | Freq: Two times a day (BID) | ORAL | 6 refills | Status: AC

## 2019-03-05 MED ORDER — ONDANSETRON HCL 8 MG PO TABS: 8.0000 mg | ORAL_TABLET | Freq: Three times a day (TID) | ORAL | 3 refills | Status: AC | PRN

## 2019-03-05 MED ORDER — DEXAMETHASONE 4 MG PO TABS: 40.0000 mg | ORAL_TABLET | ORAL | 3 refills | Status: DC

## 2019-03-05 NOTE — Telephone Encounter (Signed)
Oral Chemotherapy Pharmacist Encounter   I spoke with patient and husband for overview of: Ninlaro (ixazomib)for the treatment of multiple myeloma not having achieved remissionin conjunction with dexamethasone, planned durationuntil adequate disease control or unacceptable toxicity. Revlimd will be added with cycle 2 pending toleration to cycle 1  Counseled patient on administration, dosing, side effects, monitoring, drug-food interactions, safe handling, storage, and disposal.  Patient will take Ninlaro 66m capsules, 1 capsule by mouth once weekly. Take on an empty stomach, 1 hour before or 2 hours after a meal. Take on days 1, 8, and 15 of each 28 day cycle.  Patient will take ondansetron 8674mtablet by mouth 30-45 minutes prior to Ni[facility]ychdministration. It can also be administered at 74m9my mouth every 8 hours as needed for nausea and vomiting.  Patient will take dexamethasone 4mg15mblets, 10 tabs (40mg55m mouth once weekly on days 1, 8, 15, and 22 of each 28 day cycle.  Revlimid dose and frequency will be determined based on toleration and efficacy of cycle 1.  Ninlaro and dexamethasone start date: 03/11/2019  Adverse effects of Ninlaro include but are not limited to: peripheral edema, constipation, nausea, vomiting, decreased blood counts, and peripheral neuropathy.   Patient has anti-emetic on hand and knows to take it if nausea develops.    Adverse effects of Revlimid include but are not limited to: GI upset, fatigue, rash, peripheral edema, drug fever, and muscle pains.  Reviewed with patient importance of keeping a medication schedule and plan for any missed doses.  Medication reconciliation performed and medication/allergy list updated.  Patient informed prescriptions for dexamethasone, acyclovir, and ondansetron [facility] [place] acyclovir 400mg 25mshould be started with Ninlaro start date. Discussed with patient that  aspirin 81mg d66m should be started with Revlimid start date. Importance of both of these supportive care medications reviewed with patient.  Insurance authorization for NinlaroKennieth Raden obtained.  The 1st fill of Ninlaro 4mg cap97mes will ship from the [place] [facility] for delivery to patient's home on 4/30. Test claim at the pharmacy revealed copayment ~$2700. Oral oncology patient advocate has secured copayment grant from [employer] cover out of pocket expenses for Ninlaro Automatic Dataentually Revlimid) from dispensing pharmacy.  Subsequent fills of Ninlaro [facility]ensed [facility].  [facility] so they can physically pick up the medication as opposed to having it delivered to their home.  Revlimid is also available for dispensing [facility] so both medications can come from the same pharmacy once Revlimid is added.  All questions answered.  Written medication information and medication calendar has been compiled and [facility] mailed to patient's home address.  Mr. and Mrs. Coultas vForsunderstanding and appreciation.   They know to call the office with questions or concerns.  Jesse MaJohny Drilling, BCPS, BCOP  03/05/2019 1:56 PM Oral Oncology Clinic [phone]

## 2019-03-06 MED FILL — NINLARO 4 MG CAP: 4 | 28 days supply | Qty: 3 | Fill #0

## 2019-03-07 NOTE — Telephone Encounter (Signed)
Oral Oncology Patient Advocate Encounter  Confirmed with [place] that Kennieth Rad was shipped on 03/06/19 with a $0 copay using the [employer].   [place] Patient [place] Phone [phone] Fax [phone] 03/07/2019   8:31 AM

## 2019-03-08 ENCOUNTER — Encounter: Payer: Self-pay | Admitting: *Deleted

## 2019-03-08 ENCOUNTER — Telehealth: Payer: Self-pay | Admitting: *Deleted

## 2019-03-08 NOTE — Telephone Encounter (Signed)
Contacted patient to inform that appt for 5/4 is being cancelled. Dr. Irene Limbo requests appt for lab and MD visit scheduled for 5/18. Scheduler will contact patient. Patient verbalized understanding.

## 2019-03-09 ENCOUNTER — Encounter: Payer: Self-pay | Admitting: Hematology

## 2019-03-11 ENCOUNTER — Telehealth: Payer: Self-pay | Admitting: *Deleted

## 2019-03-11 ENCOUNTER — Inpatient Hospital Stay: Payer: Medicare Other | Admitting: Hematology

## 2019-03-11 ENCOUNTER — Encounter: Payer: Self-pay | Admitting: Hematology

## 2019-03-11 NOTE — Telephone Encounter (Signed)
Contacted patient - f/u on MyChart message: "I have an appointment with my dentist, May 7th. This for dental work.Marland KitchenMarland KitchenMarland KitchenI am to take a prescription of 4 capsules Amoxicillin 500MG  prior to my appointment, May 7th.Is it safe to take this along with the drugs that I am currently taking.?" Per Dr. Irene Limbo, advise patient that while medications can be taken together, he does not recommend routine/preventative dental work while undergoing treatment.  Patient verbalized understanding and states it is not an emergent issue and she will contact dentist to discuss waiting until after treatment. Encouraged to contact office for further questions or concerns.

## 2019-03-11 NOTE — Telephone Encounter (Signed)
Called and spoke with the patient regarding her appts on 5/18. Gave her the date/times and explained the appts with be in my chart

## 2019-03-12 NOTE — Telephone Encounter (Signed)
Oral Oncology Pharmacist Encounter  Spoke with patient and husband this morning. They had some additional questions about timing of Revlimid start.  Revlimid administration was added to medication calendar that was sent to patient previously, for completeness sake. There was an additional note on medication calendar that Revlimid would start at a later date then Ninlaro and dexamethasone.  Ninlaro and dexamethasone start date: 03/11/2019 We discussed that Revlimid is not planned to be added to treatment regimen until cycle 2 or cycle 3.  Patient endorses mild headache that began yesterday evening and does persist this morning. Patient informed it is safe to take naproxen for headache management.  Patient informed there is no reported incidence of headache with Ninlaro use, however, there are a myriad of different CNS effects that can occur with dexamethasone administration. Patient instructed to keep a side effect diary so that timing and severity of headaches, or any other possible ADE, can be tracked. Patient instructed to keep Dr. Irene Limbo abreast of side effects at her scheduled office visits. She should contact the office prior to next scheduled office visit for any severe reactions. Patient denies any other side effect since starting her treatment regimen yesterday.  I confirmed office visits for 03/22/2019 with Dr. Irene Limbo. All questions answered. They know to call the office with any additional questions or concerns.  Johny Drilling, PharmD, BCPS, BCOP  03/12/2019 8:47 AM Oral Oncology Clinic [phone]

## 2019-03-13 ENCOUNTER — Encounter: Payer: Self-pay | Admitting: Hematology

## 2019-03-15 ENCOUNTER — Telehealth: Payer: Self-pay | Admitting: *Deleted

## 2019-03-15 NOTE — Telephone Encounter (Signed)
Contacted patient in response to MyChart message regarding nose bleed. Patient states it has not happened again. Per Dr. Irene Limbo, hold ASA 81 mg for one week. Patient states not prescribed and she takes it sporadically, so she will just not take any more and take tylenol for headache instead. Dr. Irene Limbo recommended OTC nasal saline spray for to add moisture to nasal tissue. Patient verbalized understanding and states will notify office if it happens again.

## 2019-03-21 NOTE — Progress Notes (Signed)
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  HISTORY OF PRESENTING ILLNESS: please see my initial consultation for details of her initial presentation  INTERVAL HISTORY  Ms Jennifer [name] returns today for management and evaluation of her Multiple Myeloma. The patient's last visit with [name] was on 02/28/19. The pt reports that she is doing well overall. She is accompanied today her husband Tom, via [employer].  The pt reports that she has tolerated her first two weeks of Ninlaro and Dexamethasone very well. She has taken two doses so far. She notes that she might be more constipated, and is not having bowel movements every day anymore. She has taken a stool softener over the last few days, with some relief. She denies nausea, vomiting, or diarrhea. She notes that she has been taking Tylenol for some mild headaches, described as band like tightness that have been occurring more frequently. She notes that she is sleeping well and is eating very well. The pt notes that she has been staying very active and endorses good energy levels. She has continued on Acyclovir BID.  Lab results today (03/22/19) of CBC w/diff and CMP is as follows: all values are WNL except for RBC at 3.40, HGB at 11.3, HCT at 34.0, Calcium at 8.8, Total Protein at 9.9, Albumin at 3.2, Total Bilirubin at 0.2, GFR at 59.  On review of systems, pt reports good energy levels, staying active, mild headaches, some constipation, eating well, and denies diarrhea, concerns for infections, leg swelling, ankle swelling, mouth sores, skin rashes, pain along the spine, abdominal pains, and any other symptoms.   MEDICAL  HISTORY:  Past Medical History:  Diagnosis Date  . Atrial fibrillation ([place]) 12/2010   PMH of ; [place] , [place] [facility]  . DJD (degenerative joint disease)   . History of blood transfusion 7614   complication after childbirth  . Osteoarthritis    "knees, hands, fingers, toes" (09/18/2018)  . PONV (postoperative nausea and vomiting) 09/2018   nausea only with knee surgery  . Skin cancer of face 2015   Patient reports this was likely basal cell on the right side of her face needing 14 stitches.  . Smoldering multiple myeloma ([place]) 2016   "pre bone cancer; being monitored for this q 4 months or so" (09/18/2018)  . Squamous cell carcinoma of scalp 2000   S/P MOHS   . Patient Active Problem List   Diagnosis Date Noted  . Chronic left shoulder pain 11/14/2018  . History of total right knee replacement 10/17/2018  . Osteoarthritis of right knee 09/18/2018  . Primary osteoarthritis of both knees 11/30/2017  . Primary osteoarthritis of both feet 11/30/2017  . History of rotator cuff tear repair, bilateral 11/30/2017  . Primary osteoarthritis of right knee 08/23/2017  . Vitamin D deficiency 12/27/2015  . Smoldering multiple myeloma ([place]) 12/22/2015  . Hypergammaglobulinemia   . IgG monoclonal gammopathy of uncertain significance   . Abnormal gamma globulin level 04/14/2015  . Hyperlipidemia 04/03/2015  . Hyperglycemia 04/03/2015  . Diverticulosis of colon without hemorrhage 04/03/2015  . Arthralgia of multiple joints 04/03/2015  . Primary osteoarthritis of both hands 01/20/2010  . SKIN CANCER, HX OF 01/20/2010  SURGICAL HISTORY: Past Surgical History:  Procedure Laterality Date  . CATARACT EXTRACTION W/ INTRAOCULAR LENS  IMPLANT, BILATERAL Bilateral ~ 2015  . CHOLECYSTECTOMY OPEN  1990's  . COLONOSCOPY  02/2013   negative X 3; Dr Olevia Perches  . G 3 P 1    . HAMMER TOE SURGERY Left 2018   2nd digit  . JOINT REPLACEMENT    . MOHS SURGERY  ~ 2009   "back of my head"  . SHOULDER  ARTHROSCOPY W/ ROTATOR CUFF REPAIR Right 2012    Dr Durward Fortes  . SHOULDER ARTHROSCOPY W/ ROTATOR CUFF REPAIR Left 06/2014   Dr. Sydnee Cabal for rotator cuff  . TONSILLECTOMY AND ADENOIDECTOMY    . TOTAL KNEE ARTHROPLASTY Right 09/18/2018  . TOTAL KNEE ARTHROPLASTY Right 09/18/2018   Procedure: RIGHT TOTAL KNEE ARTHROPLASTY;  Surgeon: Garald Balding, MD;  Location: [facility];  Service: Orthopedics;  Laterality: Right;  . WISDOM TOOTH EXTRACTION  1960s    SOCIAL HISTORY: Social History   Socioeconomic History  . Marital status: Married    Spouse name: Not on file  . Number of children: Not on file  . Years of education: Not on file  . Highest education level: Not on file  Occupational History  . Not on file  Social Needs  . Financial resource strain: Not on file  . Food insecurity:    Worry: Not on file    Inability: Not on file  . Transportation needs:    Medical: Not on file    Non-medical: Not on file  Tobacco Use  . Smoking status: Former Smoker    Packs/day: 0.25    Years: 9.00    Pack years: 2.25    Last attempt to quit: 11/07/1970    Years since quitting: 48.4  . Smokeless tobacco: Never Used  . Tobacco comment: 1/4 ppd [id number]  Substance and Sexual Activity  . Alcohol use: Yes    Alcohol/week: 3.0 standard drinks    Types: 3 Glasses of wine per week  . Drug use: Never  . Sexual activity: Not Currently    Partners: Male  Lifestyle  . Physical activity:    Days per week: Not on file    Minutes per session: Not on file  . Stress: Not on file  Relationships  . Social connections:    Talks on phone: Not on file    Gets together: Not on file    Attends religious service: Not on file    Active member of club or organization: Not on file    Attends meetings of clubs or organizations: Not on file    Relationship status: Not on file  . Intimate partner violence:    Fear of current or ex partner: Not on file    Emotionally abused: Not on file    Physically abused:  Not on file    Forced sexual activity: Not on file  Other Topics Concern  . Not on file  Social History Narrative  . Not on file    FAMILY HISTORY: Family History  Problem Relation Age of Onset  . Atrial fibrillation Father   . Asthma Brother   . Alzheimer's disease Mother   . Myasthenia gravis Mother        ocular  . Breast cancer Paternal Grandmother   . Depression Daughter   . Colon cancer Neg Hx   . Rectal cancer Neg Hx   . Stomach cancer Neg Hx   . Diabetes Neg Hx   .  Stroke Neg Hx   . Heart attack Neg Hx     ALLERGIES:  has No Known Allergies.  MEDICATIONS:  Current Outpatient Medications  Medication Sig Dispense Refill  . acyclovir (ZOVIRAX) 400 MG tablet Take 1 tablet (400 mg total) by mouth 2 (two) times daily. 60 tablet 6  . aspirin EC 81 MG tablet Take 81 mg by mouth daily.    Marland Kitchen B-Complex-C-Biotin-Minerals-FA (FOLBEE PLUS CZ) 5 MG TABS Take by mouth.    . dexamethasone (DECADRON) 4 MG tablet Take 10 tablets (40 mg total) by mouth once a week. With food. 40 tablet 3  . ixazomib citrate (NINLARO) 4 MG capsule Take 1 capsule (4 mg total) by mouth once a week. Take on an empty stomach 1hr before or 2hrs after food on Day 1,8,15 every 28 days. 3 capsule 3  . lenalidomide (REVLIMID) 15 MG capsule Take 1 capsule (15 mg total) by mouth daily. Start on Day 1 of cycle and take for 21 days then 7 days off. 21 capsule 2  . ondansetron (ZOFRAN) 8 MG tablet Take 1 tablet (8 mg total) by mouth every 8 (eight) hours as needed for nausea or vomiting. Take 1 tab 30-59mns prior to Ninlaro 30 tablet 3   No current facility-administered medications for this visit.     REVIEW OF SYSTEMS:    A 10+ POINT REVIEW OF SYSTEMS WAS OBTAINED including neurology, dermatology, psychiatry, cardiac, respiratory, lymph, extremities, GI, GU, Musculoskeletal, constitutional, breasts, reproductive, HEENT.  All pertinent positives are noted in the HPI.  All others are negative.   PHYSICAL  EXAMINATION: ECOG PERFORMANCE STATUS: 0-1 .BP 140/77 (BP Location: Left Arm, Patient Position: Sitting)   Pulse 60   Temp 97.7 F (36.5 C) (Oral)   Resp 17   Ht 5' 7" (1.702 m)   Wt 145 lb 3.2 oz (65.9 kg)   LMP 11/07/1996   SpO2 100%   BMI 22.74 kg/m   Filed Weights   03/22/19 1027  Weight: 145 lb 3.2 oz (65.9 kg)   .Body mass index is 22.74 kg/m.  GENERAL:alert, in no acute distress and comfortable SKIN: no acute rashes, no significant lesions EYES: conjunctiva are pink and non-injected, sclera anicteric OROPHARYNX: MMM, no exudates, no oropharyngeal erythema or ulceration NECK: supple, no JVD LYMPH:  no palpable lymphadenopathy in the cervical, axillary or inguinal regions LUNGS: clear to auscultation b/l with normal respiratory effort HEART: regular rate & rhythm ABDOMEN:  normoactive bowel sounds , non tender, not distended. No palpable hepatosplenomegaly.  Extremity: no pedal edema PSYCH: alert & oriented x 3 with fluent speech NEURO: no focal motor/sensory deficits   LABORATORY DATA:  I have reviewed the data as listed  . CBC Latest Ref Rng & Units 03/22/2019 02/28/2019 01/21/2019  WBC 4.0 - 10.5 K/uL 5.3 5.7 5.5  Hemoglobin 12.0 - 15.0 g/dL 11.3(L) 12.4 10.5(L)  Hematocrit 36.0 - 46.0 % 34.0(L) 37.1 31.6(L)  Platelets 150 - 400 K/uL 230 234 297   . CBC    Component Value Date/Time   WBC 5.3 03/22/2019 0940   WBC 5.7 02/28/2019 1131   RBC 3.40 (L) 03/22/2019 0940   HGB 11.3 (L) 03/22/2019 0940   HGB 12.4 10/26/2017 1002   HCT 34.0 (L) 03/22/2019 0940   HCT 36.2 10/26/2017 1002   PLT 230 03/22/2019 0940   PLT 235 10/26/2017 1002   MCV 100.0 03/22/2019 0940   MCV 96.0 10/26/2017 1002   MCH 33.2 03/22/2019 0940   MCHC 33.2 03/22/2019 0940  RDW 12.3 03/22/2019 0940   RDW 12.7 10/26/2017 1002   LYMPHSABS 3.2 03/22/2019 0940   LYMPHSABS 2.4 10/26/2017 1002   MONOABS 0.2 03/22/2019 0940   MONOABS 0.2 10/26/2017 1002   EOSABS 0.0 03/22/2019 0940    EOSABS 0.1 10/26/2017 1002   BASOSABS 0.0 03/22/2019 0940   BASOSABS 0.0 10/26/2017 1002    . CMP Latest Ref Rng & Units 03/22/2019 02/28/2019 01/21/2019  Glucose 70 - 99 mg/dL 92 109(H) 91  BUN 8 - 23 mg/dL _0 Creatinine 0.44 - 1.00 mg/dL 0.95 0.99 0.92  Sodium 135 - 145 mmol/L 136 136 137  Potassium 3.5 - 5.1 mmol/L 4.2 4.7 3.9  Chloride 98 - 111 mmol/L 103 104 103  CO2 22 - 32 mmol/L _1 Calcium 8.9 - 10.3 mg/dL 8.8(L) 9.2 8.8(L)  Total Protein 6.5 - 8.1 g/dL 9.9(H) 10.9(H) 10.0(H)  Total Bilirubin 0.3 - 1.2 mg/dL 0.2(L) 0.4 0.4  Alkaline Phos 38 - 126 U/L 64 65 53  AST 15 - 41 U/L _2 ALT 0 - 44 U/L _3 03/01/18 Cytogenetics:   03/01/18 Bx:     11/27/18 BM Bx:     11/27/18 Cytogenetics:      RADIOGRAPHIC STUDIES: I have personally reviewed the radiological images as listed and agreed with the findings in the report. No results found. Normal DEXA scan in June 2015  Cytogenetic analysis: Revealed the presence of a normal female chromosomes with no observable clonal chromosomal abnormalities.   ASSESSMENT & PLAN:   73 y.o. Caucasian female in good overall health with  #1Smoldering Multiple Myeloma IgG lambda. Bone marrow plasma cells more than 10% ( noted to have 35% clonal plasma cells] Normal female karyotype cytogenetics with no other abnormalities SPEP shows M spike of 2.2 (slightly decrease/stabiliyt from previous levels) with IFE showing IgG lambda paraprotein. Associated with some Immunoparesis characterized by associated decrease in IgA and IgM.  M protein 2.6--> 2.6---> 2.2--->2.4--->2.3--> 2.6-->2.4->2.5--> 2.2--->3 -->2.7-->3.9-->4.1  03/02/18 PET/CT revealed No hypermetabolic osseous lesion on today's study. No hypermetabolic soft tissue disease in the neck, chest, abdomen, pelvis, or lower extremities   03/05/18 cytogenetics report revealed that her plasma cells make up 35% of her cells.    11/27/18 BM Bx revealed  slightly hypercellular bone marrow with plasma cell neoplasm and plasma cells comprising about 46% of all cells  11/27/18 PET/CT revealed No focal activity within the axillary appendicular skeleton to suggest active multiple myeloma. 2. No lytic skeletal lesions within the CT portion.   Last genetics revealed translocation 11;14, consistent with standard risk.   PLAN:  -Discussed pt labwork today, 03/22/19; blood counts are stable, Total Protein down to 9.9, other chemistries are stable. -The pt has no prohibitive toxicities from continuing C1 Ninlaro and Dexamethasone, on D1, D8 and D15 at this time. Monday 03/25/19 will be D15. -Will start Revlimid at lower dose of 76m on C2D1, with intent to increase dose if tolerance is displayed. Discussed possible side effects again. -Discussed that induction treatment would last 4-8 cycles, and would seek to lower her M spike to atleast VGPR status, followed by either maintenance treatment vs autologous BM transplant. -Continue Acyclovir BID and 827mAspirin -Continue Vitamin D replacement 2000 units daily -Continue Vitamin B complex -Proceed with Bone Density study for consideration of injectable osteoporosis medication -Recommended that the pt continue to eat well, drink at least 48-64 oz of water each day,  and walk 20-30 minutes each day. -Advised infection prevention strategies, crowd avoidance, and frequent handwashing -Will see the pt back on C2D1   RTC with Dr Irene Limbo with labs on [place] (6/1) and C2D15 (6/15) Starting Revlimid with cycle 2   All off the patients questions were answered to her apparent satisfaction. The patient knows to call the clinic with any problems, questions or concerns.  The total time spent in the appt was 30 minutes and more than 50% was on counseling and direct patient cares.    Sullivan Lone MD [place] AAHIVMS [facility] Hematology/Oncology Physician [place]  (Office):       [phone] (Work cell):   [phone] (Fax):           [phone]  I, Baldwin [place], am acting as a scribe for Dr. Sullivan Lone.   .I have reviewed the above documentation for accuracy and completeness, and I agree with the above. Brunetta Genera MD

## 2019-03-22 ENCOUNTER — Other Ambulatory Visit: Payer: Self-pay | Admitting: *Deleted

## 2019-03-22 ENCOUNTER — Inpatient Hospital Stay (HOSPITAL_BASED_OUTPATIENT_CLINIC_OR_DEPARTMENT_OTHER): Payer: Medicare Other | Admitting: Hematology

## 2019-03-22 ENCOUNTER — Inpatient Hospital Stay: Payer: Medicare Other | Attending: Hematology

## 2019-03-22 ENCOUNTER — Telehealth: Payer: Self-pay | Admitting: *Deleted

## 2019-03-22 ENCOUNTER — Other Ambulatory Visit: Payer: Self-pay

## 2019-03-22 VITALS — BP 140/77 | HR 60 | Temp 97.7°F | Resp 17 | Ht 67.0 in | Wt 145.2 lb

## 2019-03-22 DIAGNOSIS — K5900 Constipation, unspecified: Secondary | ICD-10-CM

## 2019-03-22 DIAGNOSIS — Z87891 Personal history of nicotine dependence: Secondary | ICD-10-CM

## 2019-03-22 DIAGNOSIS — R51 Headache: Secondary | ICD-10-CM

## 2019-03-22 DIAGNOSIS — C9000 Multiple myeloma not having achieved remission: Secondary | ICD-10-CM

## 2019-03-22 DIAGNOSIS — M810 Age-related osteoporosis without current pathological fracture: Secondary | ICD-10-CM

## 2019-03-22 DIAGNOSIS — Z7982 Long term (current) use of aspirin: Secondary | ICD-10-CM

## 2019-03-22 LAB — CBC WITH DIFFERENTIAL (CANCER CENTER ONLY)
Abs Immature Granulocytes: 0.01 10*3/uL (ref 0.00–0.07)
Basophils Absolute: 0 10*3/uL (ref 0.0–0.1)
Basophils Relative: 0 %
Eosinophils Absolute: 0 10*3/uL (ref 0.0–0.5)
Eosinophils Relative: 1 %
HCT: 34 % — ABNORMAL LOW (ref 36.0–46.0)
Hemoglobin: 11.3 g/dL — ABNORMAL LOW (ref 12.0–15.0)
Immature Granulocytes: 0 %
Lymphocytes Relative: 60 %
Lymphs Abs: 3.2 10*3/uL (ref 0.7–4.0)
MCH: 33.2 pg (ref 26.0–34.0)
MCHC: 33.2 g/dL (ref 30.0–36.0)
MCV: 100 fL (ref 80.0–100.0)
Monocytes Absolute: 0.2 10*3/uL (ref 0.1–1.0)
Monocytes Relative: 4 %
Neutro Abs: 1.8 10*3/uL (ref 1.7–7.7)
Neutrophils Relative %: 35 %
Platelet Count: 230 10*3/uL (ref 150–400)
RBC: 3.4 MIL/uL — ABNORMAL LOW (ref 3.87–5.11)
RDW: 12.3 % (ref 11.5–15.5)
WBC Count: 5.3 10*3/uL (ref 4.0–10.5)
nRBC: 0 % (ref 0.0–0.2)

## 2019-03-22 LAB — CMP (CANCER CENTER ONLY)
ALT: 29 U/L (ref 0–44)
AST: 18 U/L (ref 15–41)
Albumin: 3.2 g/dL — ABNORMAL LOW (ref 3.5–5.0)
Alkaline Phosphatase: 64 U/L (ref 38–126)
Anion gap: 8 (ref 5–15)
BUN: 21 mg/dL (ref 8–23)
CO2: 25 mmol/L (ref 22–32)
Calcium: 8.8 mg/dL — ABNORMAL LOW (ref 8.9–10.3)
Chloride: 103 mmol/L (ref 98–111)
Creatinine: 0.95 mg/dL (ref 0.44–1.00)
GFR, Est AFR Am: >60 mL/min (ref >60)
GFR, Est Non Af Am: 59 mL/min — ABNORMAL LOW (ref >60)
Glucose, Bld: 92 mg/dL (ref 70–99)
Potassium: 4.2 mmol/L (ref 3.5–5.1)
Sodium: 136 mmol/L (ref 135–145)
Total Bilirubin: 0.2 mg/dL — ABNORMAL LOW (ref 0.3–1.2)
Total Protein: 9.9 g/dL — ABNORMAL HIGH (ref 6.5–8.1)

## 2019-03-22 MED ORDER — LENALIDOMIDE 15 MG PO CAPS: 15.0000 mg | ORAL_CAPSULE | Freq: Every day | ORAL | 2 refills | Status: DC

## 2019-03-22 NOTE — Telephone Encounter (Signed)
Patient/MD completed/signed Patient/Physician Agreement Form for [employer]-Revlimid REMs. Patient received copy of signed agreement. Form faxed to [employer] at [phone]. Fax confirmation received. [employer] DIYM#[id number] assigned after completion of survey

## 2019-03-25 ENCOUNTER — Telehealth: Payer: Self-pay | Admitting: Hematology

## 2019-03-25 ENCOUNTER — Telehealth: Payer: Self-pay | Admitting: Pharmacist

## 2019-03-25 ENCOUNTER — Telehealth: Payer: Self-pay

## 2019-03-25 DIAGNOSIS — C9000 Multiple myeloma not having achieved remission: Secondary | ICD-10-CM

## 2019-03-25 MED ORDER — LENALIDOMIDE 15 MG PO CAPS: 15.0000 mg | ORAL_CAPSULE | Freq: Every day | ORAL | 0 refills | Status: DC

## 2019-03-25 MED ORDER — IXAZOMIB CITRATE 4 MG PO CAPS: 4.0000 mg | ORAL_CAPSULE | ORAL | 3 refills | Status: DC

## 2019-03-25 NOTE — Telephone Encounter (Signed)
Oral Oncology Patient Advocate Encounter  Prior Authorization for Revlimid has been approved.    PA# [id number] Effective dates: 12/24/18 through 03/24/20  Oral Oncology Clinic will continue to follow.   [place] Patient [place] Phone [phone] Fax [phone] 03/25/2019    1:24 PM

## 2019-03-25 NOTE — Telephone Encounter (Signed)
Scheduled appt per 5/15 los. ° °Patient aware of appt date and time. °

## 2019-03-25 NOTE — Telephone Encounter (Signed)
Oral Oncology Pharmacist Encounter  Received new prescription for Revlimid (lenalidomide) for the treatment of multiple myeloma not having achieved remission in conjunction with Ninlaro (ixazomib) and dexamethasone, planned duration until adequate disease control or unacceptable toxicity  Ninlaro and dexamethasone were initiated on 03/11/2019, Revlimid is being added with cycle 2 of therapy as patient is tolerating Ninlaro well. Revlimid planned start date 04/08/2019  Labs from 03/22/19 assessed, OK for treatment. SCr=0.95, round to 1.0 for age > 73, est CrCl ~ 50 mL/min Dose adjustment per manufacturer with CrCl 30-60 mL/min is '10mg'$  daily with increase to '15mg'$  daily after proven toleration Noted MD is starting Revlimid at '15mg'$  once daily for 21 days on, 7 days off, repeated every 28 days  Current medication list in Epic reviewed, no DDIs with Revlimid identified.  I will confirm patient remains on acyclovir for VZV prophylaxis and initiates aspirin for thromboprophylaxis.  Prescription for Revlimid will be e-scribed to appropriate specialty pharmacy for dispensing once insurance authorization is approved. Revlimid is not available for dispensing at the [facility] as it is a limited distribution medication.  Patient has previously requested both [facility]inlaro and [facility] so [facility] and they can be made ready for pick-up.  We will ensure [facility] is provided with [place] information to help cover out of pocket costs for Ninlaro and Revlimid.  Oral Oncology Clinic will continue to follow for insurance authorization, copayment issues, initial counseling and start date.  Johny Drilling, PharmD, BCPS, BCOP  03/25/2019 12:09 PM Oral Oncology Clinic [phone]

## 2019-03-25 NOTE — Telephone Encounter (Signed)
Oral Oncology Patient Advocate Encounter  Received notification from [facility] that prior authorization for Revlimid is required.  PA submitted on CoverMyMeds Key [id number] Status is pending  Oral Oncology Clinic will continue to follow.  [place] Patient [place] Phone [phone] Fax [phone] 03/25/2019    1:23 PM

## 2019-03-26 NOTE — Telephone Encounter (Signed)
Oral Oncology Patient Advocate Encounter  [facility] and gave them the grant information. The copay will be $0 there with the grant.  [facility].  [place] Patient [place] Phone [phone] Fax [phone] 03/26/2019   8:52 AM

## 2019-03-26 NOTE — Telephone Encounter (Signed)
Oral Chemotherapy Pharmacist Encounter   I spoke with patient and husband, Gershon Mussel, for overview of: Revlimid (lenalidomide) for the treatment of multiple myeloma not having achieved remission in conjunction with Ninlaro (ixazomib) and dexamethasone, planned duration until adequate disease control or unacceptable toxicity  Ninlaro and dexamethasone were initiated on 03/11/2019, Revlimid is being added with cycle 2 of therapy as patient is tolerating Ninlaro well.  Counseled on administration, dosing, side effects, monitoring, drug-food interactions, safe handling, storage, and disposal.  Patient will take Revlimid '15mg'$  capsules, 1 capsule by mouth once daily, without regard to food, with a full glass of water. Patient informed many patients take their Revlimid before bedtime.  Revlimid will be given 21 days on, 7 days off, repeat every 28 days.  Patient will continue on Ninlaro '4mg'$  capsules, 1 capsule by mouth once weekly. Take on an empty stomach, 1 hour before or 2 hours after a meal. Take on days 1, 8, and 15 of each 28 day cycle.  Patient takes ondansetron '8mg'$  tablet by mouth 30-45 minutes prior to [facility] administration. Patient knows it can also be administered at '8mg'$  by mouth every 8 hours as needed for nausea and vomiting, and reports she has not experienced any nausea in cycle 1.  Patient will continue on dexamethasone '4mg'$  tablets, 10 tabs ('40mg'$ ) by mouth once weekly on days 1, 8, 15, and 22 of each 28 day cycle (currently administered on Mondays).  Revlimid start date: 04/08/2019  Adverse effects of Revlimid include but are not limited to: nausea, constipation, diarrhea, abdominal pain, rash, fatigue, drug fever, and decreased blood counts.    Reviewed with patient importance of keeping a medication schedule and plan for any missed doses.  Medication reconciliation performed and medication/allergy list updated.  Patient continues on acyclovir '400mg'$  BID for VZV prophylaxis Patient  continues on daily aspirin '81mg'$  for VTE prophylaxis. Importance of both of these supportive therapy medication reviewed.  Insurance authorization for Revlimid has been obtained.  Revlimid and Ninalro prescriptions are being [facility] as previously requested by patient, so they can be [facility] and be made ready for pick-up. They were informed they should be hearing from [facility] soon to coordinate 1st shipment of Revlimid and next fill of Ninlaro. They were informed that we have shared the [employer] information with dispensing pharmacy to cover out of pocket expenses for both medications.  A new medication calendar will be e-mailed to patient per her request.  All questions answered.  Mr. and Mrs. Montini voiced understanding and appreciation.   Patient knows to call the office with questions or concerns.  Johny Drilling, PharmD, BCPS, BCOP  03/26/2019    3:17 PM Oral Oncology Clinic [phone]

## 2019-03-27 NOTE — Telephone Encounter (Signed)
Oral Oncology Pharmacist Encounter  Medication calendar for Ninlaro, Revlimid, and dexamethasone has been e-mailed to patient at [email or web address]  Johny Drilling, PharmD, BCPS, BCOP  03/27/2019 8:33 AM Oral Oncology Clinic [phone]

## 2019-03-27 NOTE — Telephone Encounter (Signed)
Oral Oncology Patient Advocate Encounter  I [facility] that the patients Revlimid as well as the Kennieth Rad will be delivered on 5/28 with a $0 copay.  [place] Patient [place] Phone [phone] Fax [phone] 03/27/2019   10:42 AM

## 2019-04-05 NOTE — Progress Notes (Signed)
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  HISTORY OF PRESENTING ILLNESS: please see my initial consultation for details of her initial presentation  INTERVAL HISTORY  Jennifer [name] returns today for management and evaluation of her Multiple Myeloma. The patient's last visit with [name] was on 03/22/19. The pt reports that she is doing well overall. Today is C2D1 of her treatment.  The pt reports that she has been staying very active. She notes that she played 18 holes yesterday, followed by a game of pickle ball.  The pt denies bowel issues, skin rashes, nor nausea. She notes that she has taken stool softeners and is moving her bowels well. The pt denies any problems taking Ninlaro through C1.  Lab results today (04/08/19) of CBC w/diff and CMP is as follows: all values are WNL except for RBC at 3.38, HGB at 11.4, HCT at 34.7, MCV at 102.7, Glucose at 104, Total Protein at 9.8, Albumin at 3.3, AST at 14, GFR at 60. 04/08/19 Magnesium at 2.1. Phosphorous is 2.6 04/08/19 MMP shows M spike is down from 4.1 to 3.  On review of systems, pt reports good energy levels, staying active, moving her bowels well, mild weight gain, and denies nausea, skin rashes, bowel abnormalities, mouth sores, abdominal pains, and any other symptoms.   MEDICAL HISTORY:  Past Medical History:  Diagnosis Date   Atrial fibrillation ([place]) 12/2010   PMH of ; [place] , [place] [facility]   DJD (degenerative joint disease)    History of blood transfusion 9476   complication after childbirth   Osteoarthritis    "knees, hands, fingers, toes" (09/18/2018)   PONV (postoperative nausea and vomiting) 09/2018   nausea only with knee surgery   Skin cancer of face 2015   Patient reports this was likely basal cell on the right side of her face needing 14 stitches.   Smoldering multiple myeloma ([place]) 2016   "pre bone cancer; being monitored for this q 4 months or so" (09/18/2018)   Squamous cell carcinoma of scalp 2000   S/P MOHS   . Patient Active Problem List   Diagnosis Date Noted   Chronic left shoulder pain 11/14/2018   History of total right knee replacement 10/17/2018   Osteoarthritis of right knee 09/18/2018   Primary osteoarthritis of both knees 11/30/2017   Primary osteoarthritis of both feet 11/30/2017   History of rotator cuff tear repair, bilateral 11/30/2017   Primary osteoarthritis of right knee 08/23/2017   Vitamin D deficiency 12/27/2015   Smoldering multiple myeloma ([place]) 12/22/2015   Hypergammaglobulinemia    IgG monoclonal gammopathy of uncertain significance    Abnormal gamma globulin level 04/14/2015   Hyperlipidemia 04/03/2015   Hyperglycemia 04/03/2015   Diverticulosis of colon without hemorrhage 04/03/2015   Arthralgia of multiple joints 04/03/2015   Primary osteoarthritis of both hands 01/20/2010   SKIN CANCER, HX OF 01/20/2010    SURGICAL HISTORY: Past Surgical History:  Procedure Laterality Date   CATARACT EXTRACTION W/ INTRAOCULAR LENS  IMPLANT, BILATERAL Bilateral ~ 2015   CHOLECYSTECTOMY OPEN  1990's   COLONOSCOPY  02/2013   negative X 3; Dr Olevia Perches   G 3 P 1  HAMMER TOE SURGERY Left 2018   2nd digit   JOINT REPLACEMENT     MOHS SURGERY  ~ 2009   "back of my head"   SHOULDER ARTHROSCOPY W/ ROTATOR CUFF REPAIR Right 2012    Dr Durward Fortes   SHOULDER ARTHROSCOPY W/ ROTATOR CUFF REPAIR Left 06/2014   Dr. Sydnee Cabal for rotator cuff   TONSILLECTOMY AND ADENOIDECTOMY     TOTAL KNEE ARTHROPLASTY Right 09/18/2018   TOTAL KNEE ARTHROPLASTY Right 09/18/2018   Procedure: RIGHT TOTAL KNEE ARTHROPLASTY;  Surgeon: Garald Balding, MD;  Location: [facility] [facility];  Service: Orthopedics;  Laterality: Right;   WISDOM TOOTH EXTRACTION  1960s    SOCIAL HISTORY: Social History   Socioeconomic History   Marital status: Married    Spouse name: Not on file   Number of children: Not on file   Years of education: Not on file   Highest education level: Not on file  Occupational History   Not on file  Social Needs   Financial resource strain: Not on file   Food insecurity:    Worry: Not on file    Inability: Not on file   Transportation needs:    Medical: Not on file    Non-medical: Not on file  Tobacco Use   Smoking status: Former Smoker    Packs/day: 0.25    Years: 9.00    Pack years: 2.25    Last attempt to quit: 11/07/1970    Years since quitting: 48.4   Smokeless tobacco: Never Used   Tobacco comment: 1/4 ppd [id number]  Substance and Sexual Activity   Alcohol use: Yes    Alcohol/week: 3.0 standard drinks    Types: 3 Glasses of wine per week   Drug use: Never   Sexual activity: Not Currently    Partners: Male  Lifestyle   Physical activity:    Days per week: Not on file    Minutes per session: Not on file   Stress: Not on file  Relationships   Social connections:    Talks on phone: Not on file    Gets together: Not on file    Attends religious service: Not on file    Active member of club or organization: Not on file    Attends meetings of clubs or organizations: Not on file    Relationship status: Not on file   Intimate partner violence:    Fear of current or ex partner: Not on file    Emotionally abused: Not on file    Physically abused: Not on file    Forced sexual activity: Not on file  Other Topics Concern   Not on file  Social History Narrative   Not on file    FAMILY HISTORY: Family History  Problem Relation Age of Onset   Atrial fibrillation Father    Asthma Brother    Alzheimer's disease Mother    Myasthenia gravis Mother        ocular   Breast cancer  Paternal Grandmother    Depression Daughter    Colon cancer Neg Hx    Rectal cancer Neg Hx    Stomach cancer Neg Hx    Diabetes Neg Hx    Stroke Neg Hx    Heart attack Neg Hx     ALLERGIES:  has No Known Allergies.  MEDICATIONS:  Current Outpatient Medications  Medication Sig Dispense Refill   acyclovir (ZOVIRAX) 400 MG tablet Take 1 tablet (400 mg total) by mouth 2 (two) times daily.  60 tablet 6   aspirin EC 81 MG tablet Take 81 mg by mouth daily.     dexamethasone (DECADRON) 4 MG tablet Take 10 tablets (40 mg total) by mouth once a week. With food. 40 tablet 3   ixazomib citrate (NINLARO) 4 MG capsule Take 1 capsule (4 mg total) by mouth once a week. Take on an empty stomach 1hr before or 2hrs after food on Day 1,8,15 every 28 days. 3 capsule 3   lenalidomide (REVLIMID) 15 MG capsule Take 1 capsule (15 mg total) by mouth daily. Start on Day 1 of cycle and take for 21 days then 7 days off. [employer] auth# [id number] 21 capsule 0   multivitamin-iron-minerals-folic acid (CENTRUM) chewable tablet Chew 1 tablet by mouth daily.     ondansetron (ZOFRAN) 8 MG tablet Take 1 tablet (8 mg total) by mouth every 8 (eight) hours as needed for nausea or vomiting. Take 1 tab 30-16mns prior to Ninlaro 30 tablet 3   No current facility-administered medications for this visit.     REVIEW OF SYSTEMS:    A 10+ POINT REVIEW OF SYSTEMS WAS OBTAINED including neurology, dermatology, psychiatry, cardiac, respiratory, lymph, extremities, GI, GU, Musculoskeletal, constitutional, breasts, reproductive, HEENT.  All pertinent positives are noted in the HPI.  All others are negative.   PHYSICAL EXAMINATION: ECOG PERFORMANCE STATUS: 0-1 .BP 140/79 (BP Location: Left Arm, Patient Position: Sitting)    Pulse 65    Temp 98.4 F (36.9 C) (Oral)    Resp 17    Ht '5\' 7"'$  (1.702 m)    Wt 146 lb 12.8 oz (66.6 kg)    LMP 11/07/1996    SpO2 100%    BMI 22.99 kg/m   Filed Weights   04/08/19 1400  Weight: 146 lb  12.8 oz (66.6 kg)   .Body mass index is 22.99 kg/m.  GENERAL:alert, in no acute distress and comfortable SKIN: no acute rashes, no significant lesions EYES: conjunctiva are pink and non-injected, sclera anicteric OROPHARYNX: MMM, no exudates, no oropharyngeal erythema or ulceration NECK: supple, no JVD LYMPH:  no palpable lymphadenopathy in the cervical, axillary or inguinal regions LUNGS: clear to auscultation b/l with normal respiratory effort HEART: regular rate & rhythm ABDOMEN:  normoactive bowel sounds , non tender, not distended. No palpable hepatosplenomegaly.  Extremity: no pedal edema PSYCH: alert & oriented x 3 with fluent speech NEURO: no focal motor/sensory deficits   LABORATORY DATA:  I have reviewed the data as listed  . CBC Latest Ref Rng & Units 04/08/2019 03/22/2019 02/28/2019  WBC 4.0 - 10.5 K/uL 6.0 5.3 5.7  Hemoglobin 12.0 - 15.0 g/dL 11.4(L) 11.3(L) 12.4  Hematocrit 36.0 - 46.0 % 34.7(L) 34.0(L) 37.1  Platelets 150 - 400 K/uL 234 230 234   . CBC    Component Value Date/Time   WBC 6.0 04/08/2019 1304   RBC 3.38 (L) 04/08/2019 1304   HGB 11.4 (L) 04/08/2019 1304   HGB 11.3 (L) 03/22/2019 0940   HGB 12.4 10/26/2017 1002   HCT 34.7 (L) 04/08/2019 1304   HCT 36.2 10/26/2017 1002   PLT 234 04/08/2019 1304   PLT 230 03/22/2019 0940   PLT 235 10/26/2017 1002   MCV 102.7 (H) 04/08/2019 1304   MCV 96.0 10/26/2017 1002   MCH 33.7 04/08/2019 1304   MCHC 32.9 04/08/2019 1304   RDW 12.9 04/08/2019 1304   RDW 12.7 10/26/2017 1002   LYMPHSABS 1.3 04/08/2019 1304   LYMPHSABS 2.4 10/26/2017 1002   MONOABS 0.1 04/08/2019 1304  MONOABS 0.2 10/26/2017 1002   EOSABS 0.0 04/08/2019 1304   EOSABS 0.1 10/26/2017 1002   BASOSABS 0.0 04/08/2019 1304   BASOSABS 0.0 10/26/2017 1002    . CMP Latest Ref Rng & Units 04/08/2019 03/22/2019 02/28/2019  Glucose 70 - 99 mg/dL 104(H) 92 109(H)  BUN 8 - 23 mg/dL '17 21 22  '$ Creatinine 0.44 - 1.00 mg/dL 0.94 0.95 0.99  Sodium 135  - 145 mmol/L 138 136 136  Potassium 3.5 - 5.1 mmol/L 4.0 4.2 4.7  Chloride 98 - 111 mmol/L 106 103 104  CO2 22 - 32 mmol/L '24 25 23  '$ Calcium 8.9 - 10.3 mg/dL 8.9 8.8(L) 9.2  Total Protein 6.5 - 8.1 g/dL 9.8(H) 9.9(H) 10.9(H)  Total Bilirubin 0.3 - 1.2 mg/dL 0.3 0.2(L) 0.4  Alkaline Phos 38 - 126 U/L 60 64 65  AST 15 - 41 U/L 14(L) 18 19  ALT 0 - 44 U/L '23 29 22         '$ 03/01/18 Cytogenetics:   03/01/18 Bx:     11/27/18 BM Bx:     11/27/18 Cytogenetics:      RADIOGRAPHIC STUDIES: I have personally reviewed the radiological images as listed and agreed with the findings in the report. No results found. Normal DEXA scan in June 2015  Cytogenetic analysis: Revealed the presence of a normal female chromosomes with no observable clonal chromosomal abnormalities.   ASSESSMENT & PLAN:   73 y.o. Caucasian female in good overall health with  #1Smoldering Multiple Myeloma IgG lambda. Bone marrow plasma cells more than 10% ( noted to have 35% clonal plasma cells] Normal female karyotype cytogenetics with no other abnormalities SPEP shows M spike of 2.2 (slightly decrease/stabiliyt from previous levels) with IFE showing IgG lambda paraprotein. Associated with some Immunoparesis characterized by associated decrease in IgA and IgM.  M protein 2.6--> 2.6---> 2.2--->2.4--->2.3--> 2.6-->2.4->2.5--> 2.2--->3 -->2.7-->3.9-->4.1  03/02/18 PET/CT revealed No hypermetabolic osseous lesion on today's study. No hypermetabolic soft tissue disease in the neck, chest, abdomen, pelvis, or lower extremities   03/05/18 cytogenetics report revealed that her plasma cells make up 35% of her cells.    11/27/18 BM Bx revealed slightly hypercellular bone marrow with plasma cell neoplasm and plasma cells comprising about 46% of all cells  11/27/18 PET/CT revealed No focal activity within the axillary appendicular skeleton to suggest active multiple myeloma. 2. No lytic skeletal lesions within the CT  portion.   Last genetics revealed translocation 11;14, consistent with standard risk.   PLAN:  -Discussed pt labwork today, 04/08/19; blood counts and chemistries are stable -Will now add '15mg'$  Revlimid with intent to increase dose if tolerance is displayed. Discussed possible side effects again. -The pt has no prohibitive toxicities from continuing C2D1 Ninlaro on D1, D8, and D15, '15mg'$  Revlimid three weeks on and one week off, and Dexamethasone at this time. -Discussed that induction treatment would last 4-8 cycles, and would seek to lower her M spike to atleast VGPR status, followed by either maintenance treatment vs autologous BM transplant. -Continue Acyclovir BID and '81mg'$  Aspirin -Continue Vitamin D replacement 2000 units daily -Continue Vitamin B complex -Proceed with Bone Density study for consideration of injectable osteoporosis medication -Recommended that the pt continue to eat well, drink at least 48-64 oz of water each day, and walk 20-30 minutes each day.  -Advised infection prevention strategies, crowd avoidance, and frequent handwashing -Will see the pt back in 2 weeks   RTC as per scheduled appointments on 6/15   All off the  patients questions were answered to her apparent satisfaction. The patient knows to call the clinic with any problems, questions or concerns.  The total time spent in the appt was 25 minutes and more than 50% was on counseling and direct patient cares.    Sullivan Lone MD [place] AAHIVMS [facility] Hematology/Oncology Physician [place]  (Office):       [phone] (Work cell):  [phone] (Fax):           [phone]  I, Baldwin [place], am acting as a scribe for Dr. Sullivan Lone.   .I have reviewed the above documentation for accuracy and completeness, and I agree with the above. Brunetta Genera MD

## 2019-04-08 ENCOUNTER — Inpatient Hospital Stay (HOSPITAL_BASED_OUTPATIENT_CLINIC_OR_DEPARTMENT_OTHER): Payer: Medicare Other | Admitting: Hematology

## 2019-04-08 ENCOUNTER — Inpatient Hospital Stay: Payer: Medicare Other | Attending: Hematology

## 2019-04-08 ENCOUNTER — Other Ambulatory Visit: Payer: Self-pay

## 2019-04-08 ENCOUNTER — Telehealth: Payer: Self-pay | Admitting: Hematology

## 2019-04-08 VITALS — BP 140/79 | HR 65 | Temp 98.4°F | Resp 17 | Ht 67.0 in | Wt 146.8 lb

## 2019-04-08 DIAGNOSIS — Z87891 Personal history of nicotine dependence: Secondary | ICD-10-CM | POA: Diagnosis not present

## 2019-04-08 DIAGNOSIS — C9000 Multiple myeloma not having achieved remission: Secondary | ICD-10-CM | POA: Diagnosis not present

## 2019-04-08 DIAGNOSIS — Z7982 Long term (current) use of aspirin: Secondary | ICD-10-CM | POA: Insufficient documentation

## 2019-04-08 DIAGNOSIS — K5900 Constipation, unspecified: Secondary | ICD-10-CM | POA: Diagnosis not present

## 2019-04-08 LAB — CMP (CANCER CENTER ONLY)
ALT: 23 U/L (ref 0–44)
AST: 14 U/L — ABNORMAL LOW (ref 15–41)
Albumin: 3.3 g/dL — ABNORMAL LOW (ref 3.5–5.0)
Alkaline Phosphatase: 60 U/L (ref 38–126)
Anion gap: 8 (ref 5–15)
BUN: 17 mg/dL (ref 8–23)
CO2: 24 mmol/L (ref 22–32)
Calcium: 8.9 mg/dL (ref 8.9–10.3)
Chloride: 106 mmol/L (ref 98–111)
Creatinine: 0.94 mg/dL (ref 0.44–1.00)
GFR, Est AFR Am: >60 mL/min (ref >60)
GFR, Estimated: 60 mL/min — ABNORMAL LOW (ref >60)
Glucose, Bld: 104 mg/dL — ABNORMAL HIGH (ref 70–99)
Potassium: 4 mmol/L (ref 3.5–5.1)
Sodium: 138 mmol/L (ref 135–145)
Total Bilirubin: 0.3 mg/dL (ref 0.3–1.2)
Total Protein: 9.8 g/dL — ABNORMAL HIGH (ref 6.5–8.1)

## 2019-04-08 LAB — CBC WITH DIFFERENTIAL/PLATELET
Abs Immature Granulocytes: 0.02 10*3/uL (ref 0.00–0.07)
Basophils Absolute: 0 10*3/uL (ref 0.0–0.1)
Basophils Relative: 0 %
Eosinophils Absolute: 0 10*3/uL (ref 0.0–0.5)
Eosinophils Relative: 0 %
HCT: 34.7 % — ABNORMAL LOW (ref 36.0–46.0)
Hemoglobin: 11.4 g/dL — ABNORMAL LOW (ref 12.0–15.0)
Immature Granulocytes: 0 %
Lymphocytes Relative: 21 %
Lymphs Abs: 1.3 10*3/uL (ref 0.7–4.0)
MCH: 33.7 pg (ref 26.0–34.0)
MCHC: 32.9 g/dL (ref 30.0–36.0)
MCV: 102.7 fL — ABNORMAL HIGH (ref 80.0–100.0)
Monocytes Absolute: 0.1 10*3/uL (ref 0.1–1.0)
Monocytes Relative: 1 %
Neutro Abs: 4.6 10*3/uL (ref 1.7–7.7)
Neutrophils Relative %: 78 %
Platelets: 234 10*3/uL (ref 150–400)
RBC: 3.38 MIL/uL — ABNORMAL LOW (ref 3.87–5.11)
RDW: 12.9 % (ref 11.5–15.5)
WBC: 6 10*3/uL (ref 4.0–10.5)
nRBC: 0 % (ref 0.0–0.2)

## 2019-04-08 LAB — PHOSPHORUS: Phosphorus: 2.6 mg/dL (ref 2.5–4.6)

## 2019-04-08 LAB — MAGNESIUM: Magnesium: 2.1 mg/dL (ref 1.7–2.4)

## 2019-04-08 NOTE — Telephone Encounter (Signed)
Per 6/1 los RTC as per scheduled appointments on 6/15.

## 2019-04-09 LAB — MULTIPLE MYELOMA PANEL, SERUM
Albumin SerPl Elph-Mcnc: 3.8 g/dL (ref 2.9–4.4)
Albumin/Glob SerPl: 0.8 (ref 0.7–1.7)
Alpha 1: 0.2 g/dL (ref 0.0–0.4)
Alpha2 Glob SerPl Elph-Mcnc: 0.7 g/dL (ref 0.4–1.0)
B-Globulin SerPl Elph-Mcnc: 0.9 g/dL (ref 0.7–1.3)
Gamma Glob SerPl Elph-Mcnc: 3.4 g/dL — ABNORMAL HIGH (ref 0.4–1.8)
Globulin, Total: 5.2 g/dL — ABNORMAL HIGH (ref 2.2–3.9)
IgA: 9 mg/dL — ABNORMAL LOW (ref 64–422)
IgG (Immunoglobin G), Serum: 4490 mg/dL — ABNORMAL HIGH (ref 586–1602)
IgM (Immunoglobulin M), Srm: <5 mg/dL — ABNORMAL LOW (ref 26–217)
M Protein SerPl Elph-Mcnc: 3 g/dL — ABNORMAL HIGH
Total Protein ELP: 9 g/dL — ABNORMAL HIGH (ref 6.0–8.5)

## 2019-04-19 NOTE — Progress Notes (Signed)
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  HISTORY OF PRESENTING ILLNESS: please see my initial consultation for details of her initial presentation  INTERVAL HISTORY  Jennifer [name] returns today for management and evaluation of her Multiple Myeloma. The patient's last visit with [name] was on 04/08/19. The pt reports that she is doing well overall.  The pt reports that she has not developed any new concerns. She notes that she "feels very good." She has been using an epsom salt foot bath for some mild neuropathy. She notes that she has been continuing to play golf and pickle ball several times each week.   She denies any nausea or difficulty tolerating '15mg'$  Revlimid, Ninlaro, or Dexamethasone. She also denies vomiting, diarrhea, or constipation, skin rashes, mouth sores, or other symptoms.  Lab results today (04/22/19) of CBC w/diff and CMP is as follows: all values are WNL except for RBC at 3.51, HCT at 35.9, MCV at 102.3, MCH at 34.2, CO2 at 20, Glucose at 203, Total Protein at 9.7, GFR at 58. 04/22/19 Magnesium at 2.1  On review of systems, pt reports good energy levels, staying active, and denies nausea, diarrhea, constipation, skin rashes, mouth sores, abdominal pains, and any other symptoms.   MEDICAL HISTORY:  Past Medical History:  Diagnosis Date  . Atrial fibrillation ([place]) 12/2010   PMH of ; [place] , [place] [facility]  . DJD (degenerative joint disease)   . History of blood transfusion 5188   complication after childbirth  . Osteoarthritis    "knees, hands, fingers, toes" (09/18/2018)  . PONV (postoperative nausea and vomiting) 09/2018   nausea only with knee  surgery  . Skin cancer of face 2015   Patient reports this was likely basal cell on the right side of her face needing 14 stitches.  . Smoldering multiple myeloma ([place]) 2016   "pre bone cancer; being monitored for this q 4 months or so" (09/18/2018)  . Squamous cell carcinoma of scalp 2000   S/P MOHS   . Patient Active Problem List   Diagnosis Date Noted  . Chronic left shoulder pain 11/14/2018  . History of total right knee replacement 10/17/2018  . Osteoarthritis of right knee 09/18/2018  . Primary osteoarthritis of both knees 11/30/2017  . Primary osteoarthritis of both feet 11/30/2017  . History of rotator cuff tear repair, bilateral 11/30/2017  . Primary osteoarthritis of right knee 08/23/2017  . Vitamin D deficiency 12/27/2015  . Smoldering multiple myeloma ([place]) 12/22/2015  . Hypergammaglobulinemia   . IgG monoclonal gammopathy of uncertain significance   . Abnormal gamma globulin level 04/14/2015  . Hyperlipidemia 04/03/2015  . Hyperglycemia 04/03/2015  . Diverticulosis of colon without hemorrhage 04/03/2015  . Arthralgia of multiple joints 04/03/2015  . Primary osteoarthritis of both hands 01/20/2010  . SKIN CANCER, HX OF 01/20/2010    SURGICAL HISTORY: Past Surgical History:  Procedure Laterality Date  . CATARACT EXTRACTION W/ INTRAOCULAR LENS  IMPLANT, BILATERAL Bilateral ~ 2015  . CHOLECYSTECTOMY OPEN  1990's  . COLONOSCOPY  02/2013   negative X 3; Dr Olevia Perches  . G 3 P 1    . HAMMER TOE SURGERY Left 2018   2nd digit  .  JOINT REPLACEMENT    . MOHS SURGERY  ~ 2009   "back of my head"  . SHOULDER ARTHROSCOPY W/ ROTATOR CUFF REPAIR Right 2012    Dr Durward Fortes  . SHOULDER ARTHROSCOPY W/ ROTATOR CUFF REPAIR Left 06/2014   Dr. Sydnee Cabal for rotator cuff  . TONSILLECTOMY AND ADENOIDECTOMY    . TOTAL KNEE ARTHROPLASTY Right 09/18/2018  . TOTAL KNEE ARTHROPLASTY Right 09/18/2018   Procedure: RIGHT TOTAL KNEE ARTHROPLASTY;  Surgeon: Garald Balding, MD;  Location: [facility];  Service: Orthopedics;  Laterality: Right;  . WISDOM TOOTH EXTRACTION  1960s    SOCIAL HISTORY: Social History   Socioeconomic History  . Marital status: Married    Spouse name: Not on file  . Number of children: Not on file  . Years of education: Not on file  . Highest education level: Not on file  Occupational History  . Not on file  Social Needs  . Financial resource strain: Not on file  . Food insecurity    Worry: Not on file    Inability: Not on file  . Transportation needs    Medical: Not on file    Non-medical: Not on file  Tobacco Use  . Smoking status: Former Smoker    Packs/day: 0.25    Years: 9.00    Pack years: 2.25    Quit date: 11/07/1970    Years since quitting: 48.4  . Smokeless tobacco: Never Used  . Tobacco comment: 1/4 ppd [id number]  Substance and Sexual Activity  . Alcohol use: Yes    Alcohol/week: 3.0 standard drinks    Types: 3 Glasses of wine per week  . Drug use: Never  . Sexual activity: Not Currently    Partners: Male  Lifestyle  . Physical activity    Days per week: Not on file    Minutes per session: Not on file  . Stress: Not on file  Relationships  . Social Herbalist on phone: Not on file    Gets together: Not on file    Attends religious service: Not on file    Active member of club or organization: Not on file    Attends meetings of clubs or organizations: Not on file    Relationship status: Not on file  . Intimate partner violence    Fear of current or ex partner: Not on file    Emotionally abused: Not on file    Physically abused: Not on file    Forced sexual activity: Not on file  Other Topics Concern  . Not on file  Social History Narrative  . Not on file    FAMILY HISTORY: Family History  Problem Relation Age of Onset  . Atrial fibrillation Father   . Asthma Brother   . Alzheimer's disease Mother   . Myasthenia gravis Mother        ocular  . Breast cancer Paternal Grandmother   . Depression  Daughter   . Colon cancer Neg Hx   . Rectal cancer Neg Hx   . Stomach cancer Neg Hx   . Diabetes Neg Hx   . Stroke Neg Hx   . Heart attack Neg Hx     ALLERGIES:  has No Known Allergies.  MEDICATIONS:  Current Outpatient Medications  Medication Sig Dispense Refill  . acyclovir (ZOVIRAX) 400 MG tablet Take 1 tablet (400 mg total) by mouth 2 (two) times daily. 60 tablet 6  . aspirin EC 81 MG tablet Take 81 mg  by mouth daily.    Marland Kitchen dexamethasone (DECADRON) 4 MG tablet Take 10 tablets (40 mg total) by mouth once a week. With food. 40 tablet 3  . ixazomib citrate (NINLARO) 4 MG capsule Take 1 capsule (4 mg total) by mouth once a week. Take on an empty stomach 1hr before or 2hrs after food on Day 1,8,15 every 28 days. 3 capsule 3  . lenalidomide (REVLIMID) 15 MG capsule Take 1 capsule (15 mg total) by mouth daily. Start on Day 1 of cycle and take for 21 days then 7 days off. [employer] auth# [id number] 21 capsule 0  . multivitamin-iron-minerals-folic acid (CENTRUM) chewable tablet Chew 1 tablet by mouth daily.    . ondansetron (ZOFRAN) 8 MG tablet Take 1 tablet (8 mg total) by mouth every 8 (eight) hours as needed for nausea or vomiting. Take 1 tab 30-44mns prior to Ninlaro 30 tablet 3   No current facility-administered medications for this visit.     REVIEW OF SYSTEMS:    A 10+ POINT REVIEW OF SYSTEMS WAS OBTAINED including neurology, dermatology, psychiatry, cardiac, respiratory, lymph, extremities, GI, GU, Musculoskeletal, constitutional, breasts, reproductive, HEENT.  All pertinent positives are noted in the HPI.  All others are negative.   PHYSICAL EXAMINATION: ECOG PERFORMANCE STATUS: 0-1 .BP 138/74 (BP Location: Left Arm, Patient Position: Sitting)   Pulse 80   Temp 98.3 F (36.8 C) (Oral)   Resp 18   Ht '5\' 7"'$  (1.702 m)   Wt 149 lb 12.8 oz (67.9 kg)   LMP 11/07/1996   SpO2 99%   BMI 23.46 kg/m   Filed Weights   04/22/19 1431  Weight: 149 lb 12.8 oz (67.9 kg)   .Body mass  index is 23.46 kg/m.  GENERAL:alert, in no acute distress and comfortable SKIN: no acute rashes, no significant lesions EYES: conjunctiva are pink and non-injected, sclera anicteric OROPHARYNX: MMM, no exudates, no oropharyngeal erythema or ulceration NECK: supple, no JVD LYMPH:  no palpable lymphadenopathy in the cervical, axillary or inguinal regions LUNGS: clear to auscultation b/l with normal respiratory effort HEART: regular rate & rhythm ABDOMEN:  normoactive bowel sounds , non tender, not distended. No palpable hepatosplenomegaly.  Extremity: no pedal edema PSYCH: alert & oriented x 3 with fluent speech NEURO: no focal motor/sensory deficits   LABORATORY DATA:  I have reviewed the data as listed  . CBC Latest Ref Rng & Units 04/22/2019 04/08/2019 03/22/2019  WBC 4.0 - 10.5 K/uL 6.1 6.0 5.3  Hemoglobin 12.0 - 15.0 g/dL 12.0 11.4(L) 11.3(L)  Hematocrit 36.0 - 46.0 % 35.9(L) 34.7(L) 34.0(L)  Platelets 150 - 400 K/uL 191 234 230   . CBC    Component Value Date/Time   WBC 6.1 04/22/2019 1408   RBC 3.51 (L) 04/22/2019 1408   HGB 12.0 04/22/2019 1408   HGB 11.3 (L) 03/22/2019 0940   HGB 12.4 10/26/2017 1002   HCT 35.9 (L) 04/22/2019 1408   HCT 36.2 10/26/2017 1002   PLT 191 04/22/2019 1408   PLT 230 03/22/2019 0940   PLT 235 10/26/2017 1002   MCV 102.3 (H) 04/22/2019 1408   MCV 96.0 10/26/2017 1002   MCH 34.2 (H) 04/22/2019 1408   MCHC 33.4 04/22/2019 1408   RDW 13.1 04/22/2019 1408   RDW 12.7 10/26/2017 1002   LYMPHSABS 0.7 04/22/2019 1408   LYMPHSABS 2.4 10/26/2017 1002   MONOABS 0.1 04/22/2019 1408   MONOABS 0.2 10/26/2017 1002   EOSABS 0.0 04/22/2019 1408   EOSABS 0.1 10/26/2017 1002   BASOSABS  0.0 04/22/2019 1408   BASOSABS 0.0 10/26/2017 1002    . CMP Latest Ref Rng & Units 04/22/2019 04/08/2019 03/22/2019  Glucose 70 - 99 mg/dL 203(H) 104(H) 92  BUN 8 - 23 mg/dL '17 17 21  '$ Creatinine 0.44 - 1.00 mg/dL 0.96 0.94 0.95  Sodium 135 - 145 mmol/L 136 138 136   Potassium 3.5 - 5.1 mmol/L 4.0 4.0 4.2  Chloride 98 - 111 mmol/L 104 106 103  CO2 22 - 32 mmol/L 20(L) 24 25  Calcium 8.9 - 10.3 mg/dL 8.9 8.9 8.8(L)  Total Protein 6.5 - 8.1 g/dL 9.7(H) 9.8(H) 9.9(H)  Total Bilirubin 0.3 - 1.2 mg/dL 0.3 0.3 0.2(L)  Alkaline Phos 38 - 126 U/L 61 60 64  AST 15 - 41 U/L 19 14(L) 18  ALT 0 - 44 U/L 33 23 29         03/01/18 Cytogenetics:   03/01/18 Bx:     11/27/18 BM Bx:     11/27/18 Cytogenetics:      RADIOGRAPHIC STUDIES: I have personally reviewed the radiological images as listed and agreed with the findings in the report. No results found. Normal DEXA scan in June 2015  Cytogenetic analysis: Revealed the presence of a normal female chromosomes with no observable clonal chromosomal abnormalities.   ASSESSMENT & PLAN:   73 y.o. Caucasian female in good overall health with  #1Smoldering Multiple Myeloma IgG lambda. Bone marrow plasma cells more than 10% ( noted to have 35% clonal plasma cells] Normal female karyotype cytogenetics with no other abnormalities SPEP shows M spike of 2.2 (slightly decrease/stabiliyt from previous levels) with IFE showing IgG lambda paraprotein. Associated with some Immunoparesis characterized by associated decrease in IgA and IgM.  M protein 2.6--> 2.6---> 2.2--->2.4--->2.3--> 2.6-->2.4->2.5--> 2.2--->3 -->2.7-->3.9-->4.1  03/02/18 PET/CT revealed No hypermetabolic osseous lesion on today's study. No hypermetabolic soft tissue disease in the neck, chest, abdomen, pelvis, or lower extremities   03/05/18 cytogenetics report revealed that her plasma cells make up 35% of her cells.    11/27/18 BM Bx revealed slightly hypercellular bone marrow with plasma cell neoplasm and plasma cells comprising about 46% of all cells  11/27/18 PET/CT revealed No focal activity within the axillary appendicular skeleton to suggest active multiple myeloma. 2. No lytic skeletal lesions within the CT portion.   Last genetics  revealed translocation 11;14, consistent with standard risk.   PLAN:  -Discussed pt labwork today, 04/22/19; HGB normalized to 12.0, total protein decreasing and albumin improving, blood sugars higher in setting of steroids, other blood counts and chemistries are stable. Magnesium normal at 2.1. -Discussed the 04/08/19 MMP which revealed M Protein decreased from 4.1g to 3.0g after C1 Goal for atleast 90% reduction in M Protein consistent with a Very good partial response. -The pt has no prohibitive toxicities from continuing C2D15 Ninlaro, Revlimid, and Dexamethasone at this time. Will now increase to '25mg'$  Revlimid. -Ninlaro on D1, D8, and D15 every 28 days. Revlimid 3 weeks on and one week off. -Discussed that induction treatment would last 4-8 cycles, and would seek to lower her M spike to at least VGPR status, followed by either maintenance treatment vs autologous BM transplant. -Continue Acyclovir BID and '81mg'$  Aspirin -Continue Vitamin D replacement 2000 units daily -Continue Vitamin B complex -Proceed with Bone Density study for consideration of injectable osteoporosis medication -Recommended that the pt continue to eat well, drink at least 48-64 oz of water each day, and walk 20-30 minutes each day. -Advised infection prevention strategies, crowd avoidance, and  frequent handwashing -Will see the pt back in 2 weeks on C3D1   RTC with Dr Irene Limbo with labs in 2 weeks and 4weeks with labs   All off the patients questions were answered to her apparent satisfaction. The patient knows to call the clinic with any problems, questions or concerns.  The total time spent in the appt was 25 minutes and more than 50% was on counseling and direct patient cares.    Sullivan Lone MD [place] AAHIVMS [facility] Hematology/Oncology Physician [place]  (Office):       [phone] (Work cell):  [phone] (Fax):           [phone]  I, Baldwin [place], am acting as a scribe for Dr. Sullivan Lone.   .I have reviewed the above documentation for accuracy and completeness, and I agree with the above. Brunetta Genera MD

## 2019-04-22 ENCOUNTER — Other Ambulatory Visit: Payer: Self-pay

## 2019-04-22 ENCOUNTER — Other Ambulatory Visit: Payer: Self-pay | Admitting: Hematology

## 2019-04-22 ENCOUNTER — Inpatient Hospital Stay (HOSPITAL_BASED_OUTPATIENT_CLINIC_OR_DEPARTMENT_OTHER): Payer: Medicare Other | Admitting: Hematology

## 2019-04-22 ENCOUNTER — Other Ambulatory Visit: Payer: Self-pay | Admitting: *Deleted

## 2019-04-22 ENCOUNTER — Inpatient Hospital Stay: Payer: Medicare Other

## 2019-04-22 ENCOUNTER — Telehealth: Payer: Self-pay | Admitting: Hematology

## 2019-04-22 VITALS — BP 138/74 | HR 80 | Temp 98.3°F | Resp 18 | Ht 67.0 in | Wt 149.8 lb

## 2019-04-22 DIAGNOSIS — G629 Polyneuropathy, unspecified: Secondary | ICD-10-CM | POA: Diagnosis not present

## 2019-04-22 DIAGNOSIS — C9000 Multiple myeloma not having achieved remission: Secondary | ICD-10-CM

## 2019-04-22 DIAGNOSIS — K5900 Constipation, unspecified: Secondary | ICD-10-CM | POA: Diagnosis not present

## 2019-04-22 DIAGNOSIS — Z87891 Personal history of nicotine dependence: Secondary | ICD-10-CM

## 2019-04-22 DIAGNOSIS — Z7982 Long term (current) use of aspirin: Secondary | ICD-10-CM | POA: Diagnosis not present

## 2019-04-22 LAB — CMP (CANCER CENTER ONLY)
ALT: 33 U/L (ref 0–44)
AST: 19 U/L (ref 15–41)
Albumin: 3.5 g/dL (ref 3.5–5.0)
Alkaline Phosphatase: 61 U/L (ref 38–126)
Anion gap: 12 (ref 5–15)
BUN: 17 mg/dL (ref 8–23)
CO2: 20 mmol/L — ABNORMAL LOW (ref 22–32)
Calcium: 8.9 mg/dL (ref 8.9–10.3)
Chloride: 104 mmol/L (ref 98–111)
Creatinine: 0.96 mg/dL (ref 0.44–1.00)
GFR, Est AFR Am: >60 mL/min (ref >60)
GFR, Estimated: 58 mL/min — ABNORMAL LOW (ref >60)
Glucose, Bld: 203 mg/dL — ABNORMAL HIGH (ref 70–99)
Potassium: 4 mmol/L (ref 3.5–5.1)
Sodium: 136 mmol/L (ref 135–145)
Total Bilirubin: 0.3 mg/dL (ref 0.3–1.2)
Total Protein: 9.7 g/dL — ABNORMAL HIGH (ref 6.5–8.1)

## 2019-04-22 LAB — CBC WITH DIFFERENTIAL/PLATELET
Abs Immature Granulocytes: 0.07 10*3/uL (ref 0.00–0.07)
Basophils Absolute: 0 10*3/uL (ref 0.0–0.1)
Basophils Relative: 0 %
Eosinophils Absolute: 0 10*3/uL (ref 0.0–0.5)
Eosinophils Relative: 0 %
HCT: 35.9 % — ABNORMAL LOW (ref 36.0–46.0)
Hemoglobin: 12 g/dL (ref 12.0–15.0)
Immature Granulocytes: 1 %
Lymphocytes Relative: 11 %
Lymphs Abs: 0.7 10*3/uL (ref 0.7–4.0)
MCH: 34.2 pg — ABNORMAL HIGH (ref 26.0–34.0)
MCHC: 33.4 g/dL (ref 30.0–36.0)
MCV: 102.3 fL — ABNORMAL HIGH (ref 80.0–100.0)
Monocytes Absolute: 0.1 10*3/uL (ref 0.1–1.0)
Monocytes Relative: 1 %
Neutro Abs: 5.3 10*3/uL (ref 1.7–7.7)
Neutrophils Relative %: 87 %
Platelets: 191 10*3/uL (ref 150–400)
RBC: 3.51 MIL/uL — ABNORMAL LOW (ref 3.87–5.11)
RDW: 13.1 % (ref 11.5–15.5)
WBC: 6.1 10*3/uL (ref 4.0–10.5)
nRBC: 0 % (ref 0.0–0.2)

## 2019-04-22 LAB — MAGNESIUM: Magnesium: 2.1 mg/dL (ref 1.7–2.4)

## 2019-04-22 MED ORDER — IXAZOMIB CITRATE 4 MG PO CAPS: 4.0000 mg | ORAL_CAPSULE | ORAL | 3 refills | Status: DC

## 2019-04-22 MED ORDER — LENALIDOMIDE 25 MG PO CAPS: 25.0000 mg | ORAL_CAPSULE | Freq: Every day | ORAL | 0 refills | Status: DC

## 2019-04-22 NOTE — Telephone Encounter (Signed)
Refilling Revlimid. Per Dr.Kale, dose increased to 25 mg at today's (6/15) OV. Refill sent to [facility], [place]. [employer] Auth# [id number], 04/22/2019.

## 2019-04-22 NOTE — Telephone Encounter (Signed)
Scheduled per los. Gave avs and calendar  

## 2019-04-30 ENCOUNTER — Telehealth: Payer: Self-pay | Admitting: Pharmacist

## 2019-04-30 NOTE — Telephone Encounter (Signed)
Oral Chemotherapy Pharmacist Encounter   Spoke with patient today to follow up regarding patient's oral chemotherapy medication: Revlimid (lenalidomide)in conjunction with Ninlaro (ixazomib) and dexamethasonefor the treatment of multiple myeloma not having achieved remission, planned durationuntil adequate disease control or unacceptable toxicity  Original Start date of oral chemotherapy: Ninlaro: 03/11/2019, Revlimid: 04/08/2019  Pt reports 0 tablets/doses of Nonlaro, Revlimid, or dexamethasone missed in the last month.   Pt reports the following side effects:   Patient endorses 1 episode of nausea on 04/25/2019, it was followed by vomiting.  It was relieved with a dose of ondansetron 8 mg.  Patient takes her Ninlaro on Mondays, and this would have been day 18 of current cycle.  Patient cannot tie this episode to any new foods or drinks, patient states she has been feeling well since.  Patient states that after taking ondansetron she did experience constipation.  She tried increasing fluid intake without relief.  She eventually used a Fleet enema and symptoms resolved.  We discussed use of stool softeners and osmotic diuretic to help prevent constipation in the future.  Pertinent labs reviewed: Okay for continued treatment.  Patient understands she is increasing her Revlimid dose to 25 mg once daily on days 1-21 of each 28-day cycle. Ninlaro and dexamethasone doses remain the same.  Patient knows to call the office with questions or concerns.  Johny Drilling, PharmD, BCPS, BCOP  04/30/2019 11:28 AM Oral Oncology Clinic [phone]

## 2019-05-01 NOTE — Progress Notes (Signed)
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  HISTORY OF PRESENTING ILLNESS: please see my initial consultation for details of her initial presentation  INTERVAL HISTORY  Ms Mailloux returns today for management and evaluation of her Multiple Myeloma. The patient's last visit with [name] was on 04/22/19. The pt reports that she is doing well overall.  The pt reports that she has been staying very active in the interim, as is her baseline. She recently won first place in a golf tournament. The pt notes that she has continued tolerating Ninlaro and 41m Revlimid well. She will be beginning 269mRevlimid tonight. The pt denies any skin rashes, itching, diarrhea or nausea. She notes that she had some mild constipation for a couple days last week, which has improved with Miralax. She also continues on 8128mspirin and Acyclovir BID.  Lab results today (05/06/19) of CBC w/diff is as follows: all values are WNL except for RBC at 3.54, HCT at 35.1. 05/06/19 CMP and Magnesium are pending 05/06/19 MMP is pending  On review of systems, pt reports staying very active, good energy levels, eating well, staying hydrated, and denies skin rashes, itching, diarrhea, nausea, pain along the spine, abdominal pains, fevers, chills, discomfort passing urine, leg swelling, and any other symptoms.    MEDICAL HISTORY:  Past Medical History:  Diagnosis Date   Atrial fibrillation ([facility]/2012   [place]   DJD (degenerative joint disease)    History of blood transfusion [id number] after childbirth   Osteoarthritis    "knees, hands, fingers, toes" (09/18/2018)   PONV  (postoperative nausea and vomiting) 09/2018   nausea only with knee surgery   Skin cancer of face 2015   Patient reports this was likely basal cell on the right side of her face needing 14 stitches.   Smoldering multiple myeloma ([id number]   "pre bone cancer; being monitored for this q 4 months or so" (09/18/2018)   Squamous cell carcinoma of scalp 2000   S/P MOHS   . Patient Active Problem List   Diagnosis Date Noted   Chronic left shoulder pain 11/14/2018   History of total right knee replacement 10/17/2018   Osteoarthritis of right knee 09/18/2018   Primary osteoarthritis of both knees 11/30/2017   Primary osteoarthritis of both feet 11/30/2017   History of rotator cuff tear repair, bilateral 11/30/2017   Primary osteoarthritis of right knee 08/23/2017   Vitamin D deficiency 12/27/2015   Smoldering multiple myeloma ([facility]/14/2017   Hypergammaglobulinemia    IgG monoclonal gammopathy of uncertain significance    Abnormal gamma globulin level 04/14/2015   Hyperlipidemia 04/03/2015   Hyperglycemia 04/03/2015   Diverticulosis of colon without hemorrhage 04/03/2015   Arthralgia of multiple joints 04/03/2015   Primary osteoarthritis of both hands 01/20/2010   SKIN CANCER, HX OF 01/20/2010    SURGICAL HISTORY: Past Surgical History:  Procedure Laterality Date   CATARACT EXTRACTION W/ INTRAOCULAR LENS  IMPLANT, BILATERAL Bilateral ~ 2015   CHOLECYSTECTOMY OPEN  1990's   COLONOSCOPY  02/2013   negative X 3; Dr BroOlevia PerchesG 3 P 1     HAMMER TOE  SURGERY Left 2018   2nd digit   JOINT REPLACEMENT     MOHS SURGERY  ~ 2009   "back of my head"   SHOULDER ARTHROSCOPY W/ ROTATOR CUFF REPAIR Right 2012    Dr Durward Fortes   SHOULDER ARTHROSCOPY W/ ROTATOR CUFF REPAIR Left 06/2014   Dr. Sydnee Cabal for rotator cuff   TONSILLECTOMY AND ADENOIDECTOMY     TOTAL KNEE ARTHROPLASTY Right 09/18/2018   TOTAL KNEE ARTHROPLASTY Right 09/18/2018   Procedure: RIGHT  TOTAL KNEE ARTHROPLASTY;  Surgeon: Garald Balding, MD;  Location: [facility];  Service: Orthopedics;  Laterality: Right;   WISDOM TOOTH EXTRACTION  1960s    SOCIAL HISTORY: Social History   Socioeconomic History   Marital status: Married    Spouse name: Not on file   Number of children: Not on file   Years of education: Not on file   Highest education level: Not on file  Occupational History   Not on file  Social Needs   Financial resource strain: Not on file   Food insecurity    Worry: Not on file    Inability: Not on file   Transportation needs    Medical: Not on file    Non-medical: Not on file  Tobacco Use   Smoking status: Former Smoker    Packs/day: 0.25    Years: 9.00    Pack years: 2.25    Quit date: 11/07/1970    Years since quitting: 48.5   Smokeless tobacco: Never Used   Tobacco comment: 1/4 ppd [id number]  Substance and Sexual Activity   Alcohol use: Yes    Alcohol/week: 3.0 standard drinks    Types: 3 Glasses of wine per week   Drug use: Never   Sexual activity: Not Currently    Partners: Male  Lifestyle   Physical activity    Days per week: Not on file    Minutes per session: Not on file   Stress: Not on file  Relationships   Social connections    Talks on phone: Not on file    Gets together: Not on file    Attends religious service: Not on file    Active member of club or organization: Not on file    Attends meetings of clubs or organizations: Not on file    Relationship status: Not on file   Intimate partner violence    Fear of current or ex partner: Not on file    Emotionally abused: Not on file    Physically abused: Not on file    Forced sexual activity: Not on file  Other Topics Concern   Not on file  Social History Narrative   Not on file    FAMILY HISTORY: Family History  Problem Relation Age of Onset   Atrial fibrillation Father    Asthma Brother    Alzheimer's disease Mother    Myasthenia gravis Mother          ocular   Breast cancer Paternal Grandmother    Depression Daughter    Colon cancer Neg Hx    Rectal cancer Neg Hx    Stomach cancer Neg Hx    Diabetes Neg Hx    Stroke Neg Hx    Heart attack Neg Hx     ALLERGIES:  has No Known Allergies.  MEDICATIONS:  Current Outpatient Medications  Medication Sig Dispense Refill   acyclovir (ZOVIRAX) 400 MG tablet Take 1 tablet (400 mg total) by mouth 2 (two) times daily. 60 tablet 6  aspirin EC 81 MG tablet Take 81 mg by mouth daily.     dexamethasone (DECADRON) 4 MG tablet Take 10 tablets (40 mg total) by mouth once a week. With food. 40 tablet 3   ixazomib citrate (NINLARO) 4 MG capsule Take 1 capsule (4 mg total) by mouth once a week. Take on an empty stomach 1hr before or 2hrs after food on Day 1,8,15 every 28 days. 3 capsule 3   lenalidomide (REVLIMID) 25 MG capsule Take 1 capsule (25 mg total) by mouth daily. Start on Day 1 of cycle and take for 21 days then 7 days off 21 capsule 0   multivitamin-iron-minerals-folic acid (CENTRUM) chewable tablet Chew 1 tablet by mouth daily.     ondansetron (ZOFRAN) 8 MG tablet Take 1 tablet (8 mg total) by mouth every 8 (eight) hours as needed for nausea or vomiting. Take 1 tab 30-52mns prior to Ninlaro 30 tablet 3   No current facility-administered medications for this visit.     REVIEW OF SYSTEMS:    A 10+ POINT REVIEW OF SYSTEMS WAS OBTAINED including neurology, dermatology, psychiatry, cardiac, respiratory, lymph, extremities, GI, GU, Musculoskeletal, constitutional, breasts, reproductive, HEENT.  All pertinent positives are noted in the HPI.  All others are negative.   PHYSICAL EXAMINATION: ECOG PERFORMANCE STATUS: 0-1 .BP 140/72 (BP Location: Left Arm, Patient Position: Sitting)    Pulse 64    Temp 98.7 F (37.1 C) (Oral)    Resp 17    Ht 5' 7" (1.702 m)    Wt 147 lb 3.2 oz (66.8 kg)    LMP 11/07/1996    SpO2 100%    BMI 23.05 kg/m   Filed Weights   05/06/19 0926   Weight: 147 lb 3.2 oz (66.8 kg)   .Body mass index is 23.05 kg/m.  GENERAL:alert, in no acute distress and comfortable SKIN: no acute rashes, no significant lesions EYES: conjunctiva are pink and non-injected, sclera anicteric OROPHARYNX: MMM, no exudates, no oropharyngeal erythema or ulceration NECK: supple, no JVD LYMPH:  no palpable lymphadenopathy in the cervical, axillary or inguinal regions LUNGS: clear to auscultation b/l with normal respiratory effort HEART: regular rate & rhythm ABDOMEN:  normoactive bowel sounds , non tender, not distended. No palpable hepatosplenomegaly.  Extremity: no pedal edema PSYCH: alert & oriented x 3 with fluent speech NEURO: no focal motor/sensory deficits   LABORATORY DATA:  I have reviewed the data as listed  . CBC Latest Ref Rng & Units 05/06/2019 04/22/2019 04/08/2019  WBC 4.0 - 10.5 K/uL 5.0 6.1 6.0  Hemoglobin 12.0 - 15.0 g/dL 12.0 12.0 11.4(L)  Hematocrit 36.0 - 46.0 % 35.1(L) 35.9(L) 34.7(L)  Platelets 150 - 400 K/uL 314 191 234   . CBC    Component Value Date/Time   WBC 5.0 05/06/2019 0903   RBC 3.54 (L) 05/06/2019 0903   HGB 12.0 05/06/2019 0903   HGB 11.3 (L) 03/22/2019 0940   HGB 12.4 10/26/2017 1002   HCT 35.1 (L) 05/06/2019 0903   HCT 36.2 10/26/2017 1002   PLT 314 05/06/2019 0903   PLT 230 03/22/2019 0940   PLT 235 10/26/2017 1002   MCV 99.2 05/06/2019 0903   MCV 96.0 10/26/2017 1002   MCH 33.9 05/06/2019 0903   MCHC 34.2 05/06/2019 0903   RDW 12.8 05/06/2019 0903   RDW 12.7 10/26/2017 1002   LYMPHSABS 1.0 05/06/2019 0903   LYMPHSABS 2.4 10/26/2017 1002   MONOABS 0.1 05/06/2019 0903   MONOABS 0.2 10/26/2017 1002   EOSABS 0.0  05/06/2019 0903   EOSABS 0.1 10/26/2017 1002   BASOSABS 0.0 05/06/2019 0903   BASOSABS 0.0 10/26/2017 1002    . CMP Latest Ref Rng & Units 04/22/2019 04/08/2019 03/22/2019  Glucose 70 - 99 mg/dL 203(H) 104(H) 92  BUN 8 - 23 mg/dL _0 Creatinine 0.44 - 1.00 mg/dL 0.96 0.94 0.95   Sodium 135 - 145 mmol/L 136 138 136  Potassium 3.5 - 5.1 mmol/L 4.0 4.0 4.2  Chloride 98 - 111 mmol/L 104 106 103  CO2 22 - 32 mmol/L 20(L) 24 25  Calcium 8.9 - 10.3 mg/dL 8.9 8.9 8.8(L)  Total Protein 6.5 - 8.1 g/dL 9.7(H) 9.8(H) 9.9(H)  Total Bilirubin 0.3 - 1.2 mg/dL 0.3 0.3 0.2(L)  Alkaline Phos 38 - 126 U/L 61 60 64  AST 15 - 41 U/L 19 14(L) 18  ALT 0 - 44 U/L 33 23 29         03/01/18 Cytogenetics:   03/01/18 Bx:     11/27/18 BM Bx:     11/27/18 Cytogenetics:      RADIOGRAPHIC STUDIES: I have personally reviewed the radiological images as listed and agreed with the findings in the report. No results found. Normal DEXA scan in June 2015  Cytogenetic analysis: Revealed the presence of a normal female chromosomes with no observable clonal chromosomal abnormalities.   ASSESSMENT & PLAN:   73 y.o. Caucasian female in good overall health with  #1Smoldering Multiple Myeloma IgG lambda. Bone marrow plasma cells more than 10% ( noted to have 35% clonal plasma cells] Normal female karyotype cytogenetics with no other abnormalities SPEP shows M spike of 2.2 (slightly decrease/stabiliyt from previous levels) with IFE showing IgG lambda paraprotein. Associated with some Immunoparesis characterized by associated decrease in IgA and IgM.  M protein 2.6--> 2.6---> 2.2--->2.4--->2.3--> 2.6-->2.4->2.5--> 2.2--->3 -->2.7-->3.9-->4.1  03/02/18 PET/CT revealed No hypermetabolic osseous lesion on today's study. No hypermetabolic soft tissue disease in the neck, chest, abdomen, pelvis, or lower extremities   03/05/18 cytogenetics report revealed that her plasma cells make up 35% of her cells.    11/27/18 BM Bx revealed slightly hypercellular bone marrow with plasma cell neoplasm and plasma cells comprising about 46% of all cells  11/27/18 PET/CT revealed No focal activity within the axillary appendicular skeleton to suggest active multiple myeloma. 2. No lytic skeletal lesions  within the CT portion.   Last genetics revealed translocation 11;14, consistent with standard risk.   PLAN:  -Discussed pt labwork today, 05/06/19; HGB normalized. PLT and WBC continue to be normal.  -The pt has no prohibitive toxicities from increasing to 89m Revlimid, Dexamethasone, and Ninlaro at this time. Today is C3D1. -Ninlaro on D1, D8, and D15 every 28 days. Revlimid 3 weeks on and one week off. -Continue staying well hydrated and consume potassium rich foods and drinks -Continue Miralax for constipation. Okay to add Senna S if needed. -Discussed that induction treatment would last 4-8 cycles, and would seek to lower her M spike to at least VGPR status, followed by either maintenance treatment vs autologous BM transplant. -Continue Acyclovir BID and 820mAspirin -Continue Vitamin D replacement 2000 units daily -Continue Vitamin B complex -Proceed with Bone Density study for consideration of injectable osteoporosis medication -Recommended that the pt continue to eat well, drink at least 48-64 oz of water each day, and walk 20-30 minutes each day. -Advised infection prevention strategies, crowd avoidance, and frequent handwashing -Will see the pt back in 2 weeks   F/u as per appointments on  05/21/2019 with labs and MD visit   All off the patients questions were answered to her apparent satisfaction. The patient knows to call the clinic with any problems, questions or concerns.  The total time spent in the appt was 25 minutes and more than 50% was on counseling and direct patient cares.    Sullivan Lone MD [place] [facility] Hematology/Oncology Physician [place]  (Office):       [phone] (Work cell):  [phone] (Fax):           [phone]  I, Baldwin [place], am acting as a scribe for Dr. Sullivan Lone.   .I have reviewed the above documentation for accuracy and completeness, and I agree with the above.  Brunetta Genera MD

## 2019-05-06 ENCOUNTER — Inpatient Hospital Stay (HOSPITAL_BASED_OUTPATIENT_CLINIC_OR_DEPARTMENT_OTHER): Payer: Medicare Other | Admitting: Hematology

## 2019-05-06 ENCOUNTER — Inpatient Hospital Stay: Payer: Medicare Other

## 2019-05-06 ENCOUNTER — Telehealth: Payer: Self-pay | Admitting: Hematology

## 2019-05-06 ENCOUNTER — Other Ambulatory Visit: Payer: Self-pay

## 2019-05-06 VITALS — BP 140/72 | HR 64 | Temp 98.7°F | Resp 17 | Ht 67.0 in | Wt 147.2 lb

## 2019-05-06 DIAGNOSIS — Z7982 Long term (current) use of aspirin: Secondary | ICD-10-CM

## 2019-05-06 DIAGNOSIS — K5900 Constipation, unspecified: Secondary | ICD-10-CM | POA: Diagnosis not present

## 2019-05-06 DIAGNOSIS — C9000 Multiple myeloma not having achieved remission: Secondary | ICD-10-CM

## 2019-05-06 DIAGNOSIS — Z87891 Personal history of nicotine dependence: Secondary | ICD-10-CM

## 2019-05-06 LAB — CMP (CANCER CENTER ONLY)
ALT: 29 U/L (ref 0–44)
AST: 18 U/L (ref 15–41)
Albumin: 3.4 g/dL — ABNORMAL LOW (ref 3.5–5.0)
Alkaline Phosphatase: 60 U/L (ref 38–126)
Anion gap: 7 (ref 5–15)
BUN: 15 mg/dL (ref 8–23)
CO2: 21 mmol/L — ABNORMAL LOW (ref 22–32)
Calcium: 8.6 mg/dL — ABNORMAL LOW (ref 8.9–10.3)
Chloride: 110 mmol/L (ref 98–111)
Creatinine: 0.95 mg/dL (ref 0.44–1.00)
GFR, Est AFR Am: >60 mL/min (ref >60)
GFR, Estimated: 59 mL/min — ABNORMAL LOW (ref >60)
Glucose, Bld: 105 mg/dL — ABNORMAL HIGH (ref 70–99)
Potassium: 4.4 mmol/L (ref 3.5–5.1)
Sodium: 138 mmol/L (ref 135–145)
Total Bilirubin: 0.3 mg/dL (ref 0.3–1.2)
Total Protein: 8.2 g/dL — ABNORMAL HIGH (ref 6.5–8.1)

## 2019-05-06 LAB — CBC WITH DIFFERENTIAL/PLATELET
Abs Immature Granulocytes: 0.03 10*3/uL (ref 0.00–0.07)
Basophils Absolute: 0 10*3/uL (ref 0.0–0.1)
Basophils Relative: 1 %
Eosinophils Absolute: 0 10*3/uL (ref 0.0–0.5)
Eosinophils Relative: 0 %
HCT: 35.1 % — ABNORMAL LOW (ref 36.0–46.0)
Hemoglobin: 12 g/dL (ref 12.0–15.0)
Immature Granulocytes: 1 %
Lymphocytes Relative: 21 %
Lymphs Abs: 1 10*3/uL (ref 0.7–4.0)
MCH: 33.9 pg (ref 26.0–34.0)
MCHC: 34.2 g/dL (ref 30.0–36.0)
MCV: 99.2 fL (ref 80.0–100.0)
Monocytes Absolute: 0.1 10*3/uL (ref 0.1–1.0)
Monocytes Relative: 3 %
Neutro Abs: 3.7 10*3/uL (ref 1.7–7.7)
Neutrophils Relative %: 74 %
Platelets: 314 10*3/uL (ref 150–400)
RBC: 3.54 MIL/uL — ABNORMAL LOW (ref 3.87–5.11)
RDW: 12.8 % (ref 11.5–15.5)
WBC: 5 10*3/uL (ref 4.0–10.5)
nRBC: 0 % (ref 0.0–0.2)

## 2019-05-06 LAB — MAGNESIUM: Magnesium: 2.1 mg/dL (ref 1.7–2.4)

## 2019-05-06 NOTE — Telephone Encounter (Signed)
Per 6/29 los F/u as per appointments on 05/21/2019 with labs and MD visit.

## 2019-05-07 LAB — MULTIPLE MYELOMA PANEL, SERUM
Albumin SerPl Elph-Mcnc: 3.6 g/dL (ref 2.9–4.4)
Albumin/Glob SerPl: 0.9 (ref 0.7–1.7)
Alpha 1: 0.2 g/dL (ref 0.0–0.4)
Alpha2 Glob SerPl Elph-Mcnc: 0.7 g/dL (ref 0.4–1.0)
B-Globulin SerPl Elph-Mcnc: 0.9 g/dL (ref 0.7–1.3)
Gamma Glob SerPl Elph-Mcnc: 2.4 g/dL — ABNORMAL HIGH (ref 0.4–1.8)
Globulin, Total: 4.2 g/dL — ABNORMAL HIGH (ref 2.2–3.9)
IgA: 10 mg/dL — ABNORMAL LOW (ref 64–422)
IgG (Immunoglobin G), Serum: 3064 mg/dL — ABNORMAL HIGH (ref 586–1602)
IgM (Immunoglobulin M), Srm: 6 mg/dL — ABNORMAL LOW (ref 26–217)
M Protein SerPl Elph-Mcnc: 2.2 g/dL — ABNORMAL HIGH
Total Protein ELP: 7.8 g/dL (ref 6.0–8.5)

## 2019-05-21 ENCOUNTER — Telehealth: Payer: Self-pay | Admitting: Hematology

## 2019-05-21 ENCOUNTER — Other Ambulatory Visit: Payer: Self-pay

## 2019-05-21 ENCOUNTER — Inpatient Hospital Stay: Payer: Medicare Other

## 2019-05-21 ENCOUNTER — Other Ambulatory Visit: Payer: Self-pay | Admitting: Hematology

## 2019-05-21 ENCOUNTER — Inpatient Hospital Stay: Payer: Medicare Other | Attending: Hematology | Admitting: Hematology

## 2019-05-21 ENCOUNTER — Other Ambulatory Visit: Payer: Self-pay | Admitting: *Deleted

## 2019-05-21 VITALS — BP 147/66 | HR 65 | Temp 98.0°F | Resp 18 | Ht 67.0 in | Wt 145.4 lb

## 2019-05-21 DIAGNOSIS — Z7982 Long term (current) use of aspirin: Secondary | ICD-10-CM

## 2019-05-21 DIAGNOSIS — C9000 Multiple myeloma not having achieved remission: Secondary | ICD-10-CM

## 2019-05-21 DIAGNOSIS — R252 Cramp and spasm: Secondary | ICD-10-CM | POA: Diagnosis not present

## 2019-05-21 DIAGNOSIS — Z87891 Personal history of nicotine dependence: Secondary | ICD-10-CM | POA: Insufficient documentation

## 2019-05-21 DIAGNOSIS — R51 Headache: Secondary | ICD-10-CM | POA: Insufficient documentation

## 2019-05-21 DIAGNOSIS — E559 Vitamin D deficiency, unspecified: Secondary | ICD-10-CM

## 2019-05-21 LAB — CBC WITH DIFFERENTIAL/PLATELET
Abs Immature Granulocytes: 0.13 10*3/uL — ABNORMAL HIGH (ref 0.00–0.07)
Basophils Absolute: 0 10*3/uL (ref 0.0–0.1)
Basophils Relative: 0 %
Eosinophils Absolute: 0 10*3/uL (ref 0.0–0.5)
Eosinophils Relative: 0 %
HCT: 37.1 % (ref 36.0–46.0)
Hemoglobin: 12.4 g/dL (ref 12.0–15.0)
Immature Granulocytes: 1 %
Lymphocytes Relative: 21 %
Lymphs Abs: 2.7 10*3/uL (ref 0.7–4.0)
MCH: 33.5 pg (ref 26.0–34.0)
MCHC: 33.4 g/dL (ref 30.0–36.0)
MCV: 100.3 fL — ABNORMAL HIGH (ref 80.0–100.0)
Monocytes Absolute: 1.3 10*3/uL — ABNORMAL HIGH (ref 0.1–1.0)
Monocytes Relative: 10 %
Neutro Abs: 8.7 10*3/uL — ABNORMAL HIGH (ref 1.7–7.7)
Neutrophils Relative %: 68 %
Platelets: 227 10*3/uL (ref 150–400)
RBC: 3.7 MIL/uL — ABNORMAL LOW (ref 3.87–5.11)
RDW: 12.7 % (ref 11.5–15.5)
WBC: 12.8 10*3/uL — ABNORMAL HIGH (ref 4.0–10.5)
nRBC: 0 % (ref 0.0–0.2)

## 2019-05-21 LAB — CMP (CANCER CENTER ONLY)
ALT: 33 U/L (ref 0–44)
AST: 18 U/L (ref 15–41)
Albumin: 3.7 g/dL (ref 3.5–5.0)
Alkaline Phosphatase: 60 U/L (ref 38–126)
Anion gap: 12 (ref 5–15)
BUN: 16 mg/dL (ref 8–23)
CO2: 22 mmol/L (ref 22–32)
Calcium: 9.2 mg/dL (ref 8.9–10.3)
Chloride: 106 mmol/L (ref 98–111)
Creatinine: 0.88 mg/dL (ref 0.44–1.00)
GFR, Est AFR Am: >60 mL/min (ref >60)
GFR, Estimated: >60 mL/min (ref >60)
Glucose, Bld: 104 mg/dL — ABNORMAL HIGH (ref 70–99)
Potassium: 3.5 mmol/L (ref 3.5–5.1)
Sodium: 140 mmol/L (ref 135–145)
Total Bilirubin: 0.4 mg/dL (ref 0.3–1.2)
Total Protein: 8.2 g/dL — ABNORMAL HIGH (ref 6.5–8.1)

## 2019-05-21 LAB — LACTATE DEHYDROGENASE: LDH: 160 U/L (ref 98–192)

## 2019-05-21 MED ORDER — LENALIDOMIDE 25 MG PO CAPS: 25.0000 mg | ORAL_CAPSULE | Freq: Every day | ORAL | 0 refills | Status: DC

## 2019-05-21 NOTE — Telephone Encounter (Signed)
Revlimid refilled per Dr. Irene Limbo verbal order and OV note 7/14. Refill sent to [facility], [address], [place], [place] Auth# ?[id number]   On 05/21/2019

## 2019-05-21 NOTE — Telephone Encounter (Signed)
Scheduled appt per 7/14 los. ° °Spoke with patient and she is aware of her appt date and time. °

## 2019-05-21 NOTE — Progress Notes (Signed)
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  HISTORY OF PRESENTING ILLNESS: please see my initial consultation for details of her initial presentation  INTERVAL HISTORY  Ms Baby returns today for management and evaluation of her Multiple Myeloma. The patient's last visit with [name] was on 05/06/2019. The pt reports that she is doing well overall. She is here for C3D15 of lenalidomide. The pt has no prohibitive toxicities from continuing lenalidomide at this time. She is accompanied by her husband Gershon Mussel) over the phone.  The pt reports headaches that go away with medication. She also has some cramping in her right hand. She has been taking her aspirin daily and occasionally takes melatonin at night. Denies GI symptoms, rashes, diarrhea, nausea, vomiting, tingling or numbness in hands feet, leg swelling, or skin rashes.   Lab results today (05/21/19) of CBC w/diff and CMP is as follows: all values are WNL except for WBC at 12.8k, RBC at 3.70, MCV at 100.3, neutro abs at 8.7k, monocytes abs at 1.3k  05/21/2019 LDH at 160, glucose bld at 104, total protein at 8.2 MMP from 05/06/2019 reveals IgG monoclonal protein with lambda light chain specificity. M protein decreased from 4.1 to 2.2 after 2 cycles.  On review of systems, pt reports headaches and denies GI symptoms, rashes, new back pains, mouth soreness, diarrhea, nausea, vomiting, tingling or numbness in hands feet, leg swelling, or skin rashes, and any other symptoms.    MEDICAL HISTORY:  Past Medical History:  Diagnosis Date   Atrial fibrillation ([place]) 12/2010   PMH of ; [place] , [place] [facility]   DJD (degenerative  joint disease)    History of blood transfusion 1287   complication after childbirth   Osteoarthritis    "knees, hands, fingers, toes" (09/18/2018)   PONV (postoperative nausea and vomiting) 09/2018   nausea only with knee surgery   Skin cancer of face 2015   Patient reports this was likely basal cell on the right side of her face needing 14 stitches.   Smoldering multiple myeloma ([place]) 2016   "pre bone cancer; being monitored for this q 4 months or so" (09/18/2018)   Squamous cell carcinoma of scalp 2000   S/P MOHS   . Patient Active Problem List   Diagnosis Date Noted   Chronic left shoulder pain 11/14/2018   History of total right knee replacement 10/17/2018   Osteoarthritis of right knee 09/18/2018   Primary osteoarthritis of both knees 11/30/2017   Primary osteoarthritis of both feet 11/30/2017   History of rotator cuff tear repair, bilateral 11/30/2017   Primary osteoarthritis of right knee 08/23/2017   Vitamin D deficiency 12/27/2015   Smoldering multiple myeloma ([place]) 12/22/2015   Hypergammaglobulinemia    IgG monoclonal gammopathy of uncertain significance    Abnormal gamma globulin level 04/14/2015   Hyperlipidemia 04/03/2015   Hyperglycemia 04/03/2015   Diverticulosis of colon without hemorrhage 04/03/2015   Arthralgia of multiple joints 04/03/2015   Primary osteoarthritis of both hands 01/20/2010   SKIN CANCER, HX OF 01/20/2010    SURGICAL HISTORY: Past Surgical History:  Procedure Laterality Date   CATARACT EXTRACTION W/ INTRAOCULAR LENS  IMPLANT, BILATERAL Bilateral ~ 2015   CHOLECYSTECTOMY  OPEN  1990's   COLONOSCOPY  02/2013   negative X 3; Dr Olevia Perches   G 3 P 1     HAMMER TOE SURGERY Left 2018   2nd digit   JOINT REPLACEMENT     MOHS SURGERY  ~ 2009   "back of my head"   SHOULDER ARTHROSCOPY W/ ROTATOR CUFF REPAIR Right 2012    Dr Durward Fortes   SHOULDER ARTHROSCOPY W/ ROTATOR CUFF REPAIR Left 06/2014   Dr. Sydnee Cabal for  rotator cuff   TONSILLECTOMY AND ADENOIDECTOMY     TOTAL KNEE ARTHROPLASTY Right 09/18/2018   TOTAL KNEE ARTHROPLASTY Right 09/18/2018   Procedure: RIGHT TOTAL KNEE ARTHROPLASTY;  Surgeon: Garald Balding, MD;  Location: [facility];  Service: Orthopedics;  Laterality: Right;   WISDOM TOOTH EXTRACTION  1960s    SOCIAL HISTORY: Social History   Socioeconomic History   Marital status: Married    Spouse name: Not on file   Number of children: Not on file   Years of education: Not on file   Highest education level: Not on file  Occupational History   Not on file  Social Needs   Financial resource strain: Not on file   Food insecurity    Worry: Not on file    Inability: Not on file   Transportation needs    Medical: Not on file    Non-medical: Not on file  Tobacco Use   Smoking status: Former Smoker    Packs/day: 0.25    Years: 9.00    Pack years: 2.25    Quit date: 11/07/1970    Years since quitting: 48.5   Smokeless tobacco: Never Used   Tobacco comment: 1/4 ppd 4037-0964  Substance and Sexual Activity   Alcohol use: Yes    Alcohol/week: 3.0 standard drinks    Types: 3 Glasses of wine per week   Drug use: Never   Sexual activity: Not Currently    Partners: Male  Lifestyle   Physical activity    Days per week: Not on file    Minutes per session: Not on file   Stress: Not on file  Relationships   Social connections    Talks on phone: Not on file    Gets together: Not on file    Attends religious service: Not on file    Active member of club or organization: Not on file    Attends meetings of clubs or organizations: Not on file    Relationship status: Not on file   Intimate partner violence    Fear of current or ex partner: Not on file    Emotionally abused: Not on file    Physically abused: Not on file    Forced sexual activity: Not on file  Other Topics Concern   Not on file  Social History Narrative   Not on file    FAMILY  HISTORY: Family History  Problem Relation Age of Onset   Atrial fibrillation Father    Asthma Brother    Alzheimer's disease Mother    Myasthenia gravis Mother        ocular   Breast cancer Paternal Grandmother    Depression Daughter    Colon cancer Neg Hx    Rectal cancer Neg Hx    Stomach cancer Neg Hx    Diabetes Neg Hx    Stroke Neg Hx    Heart attack Neg Hx     ALLERGIES:  has No Known Allergies.  MEDICATIONS:  Current Outpatient Medications  Medication Sig Dispense Refill   acyclovir (ZOVIRAX) 400 MG tablet Take 1 tablet (400 mg total) by mouth 2 (two) times daily. 60 tablet 6   aspirin EC 81 MG tablet Take 81 mg by mouth daily.     dexamethasone (DECADRON) 4 MG tablet Take 10 tablets (40 mg total) by mouth once a week. With food. 40 tablet 3   ixazomib citrate (NINLARO) 4 MG capsule Take 1 capsule (4 mg total) by mouth once a week. Take on an empty stomach 1hr before or 2hrs after food on Day 1,8,15 every 28 days. 3 capsule 3   lenalidomide (REVLIMID) 25 MG capsule Take 1 capsule (25 mg total) by mouth daily. Start on Day 1 of cycle and take for 21 days then 7 days off 21 capsule 0   multivitamin-iron-minerals-folic acid (CENTRUM) chewable tablet Chew 1 tablet by mouth daily.     ondansetron (ZOFRAN) 8 MG tablet Take 1 tablet (8 mg total) by mouth every 8 (eight) hours as needed for nausea or vomiting. Take 1 tab 30-22mns prior to Ninlaro 30 tablet 3   No current facility-administered medications for this visit.     REVIEW OF SYSTEMS:   A 10+ POINT REVIEW OF SYSTEMS WAS OBTAINED including neurology, dermatology, psychiatry, cardiac, respiratory, lymph, extremities, GI, GU, Musculoskeletal, constitutional, breasts, reproductive, HEENT.  All pertinent positives are noted in the HPI.  All others are negative.   PHYSICAL EXAMINATION: ECOG PERFORMANCE STATUS: 0-1 .BP (!) 147/66 (BP Location: Right Arm, Patient Position: Sitting)    Pulse 65    Temp 98 F  (36.7 C) (Temporal)    Resp 18    Ht '5\' 7"'$  (1.702 m)    Wt 145 lb 6.4 oz (66 kg)    LMP 11/07/1996    SpO2 100%    BMI 22.77 kg/m   Filed Weights   05/21/19 1243  Weight: 145 lb 6.4 oz (66 kg)   .Body mass index is 22.77 kg/m.  GENERAL:alert, in no acute distress and comfortable  SKIN: no acute rashes, no significant lesions EYES: conjunctiva are pink and non-injected, sclera anicteric OROPHARYNX: MMM, no exudates, no oropharyngeal erythema or ulceration NECK: supple, no JVD LYMPH:  no palpable lymphadenopathy in the cervical, axillary or inguinal regions LUNGS: clear to auscultation b/l with normal respiratory effort HEART: regular rate & rhythm ABDOMEN:  normoactive bowel sounds , non tender, not distended. Extremity: no pedal edema PSYCH: alert & oriented x 3 with fluent speech NEURO: no focal motor/sensory deficits  LABORATORY DATA:  I have reviewed the data as listed  . CBC Latest Ref Rng & Units 05/21/2019 05/06/2019 04/22/2019  WBC 4.0 - 10.5 K/uL 12.8(H) 5.0 6.1  Hemoglobin 12.0 - 15.0 g/dL 12.4 12.0 12.0  Hematocrit 36.0 - 46.0 % 37.1 35.1(L) 35.9(L)  Platelets 150 - 400 K/uL 227 314 191   . CBC    Component Value Date/Time   WBC 12.8 (H) 05/21/2019 1126   RBC 3.70 (L) 05/21/2019 1126   HGB 12.4 05/21/2019 1126   HGB 11.3 (L) 03/22/2019 0940   HGB 12.4 10/26/2017 1002   HCT 37.1 05/21/2019 1126   HCT 36.2 10/26/2017 1002   PLT 227 05/21/2019 1126   PLT 230 03/22/2019 0940   PLT 235 10/26/2017 1002   MCV 100.3 (H) 05/21/2019 1126   MCV 96.0 10/26/2017 1002   MCH 33.5 05/21/2019 1126   MCHC 33.4 05/21/2019 1126   RDW 12.7 05/21/2019 1126   RDW 12.7 10/26/2017 1002   LYMPHSABS  2.7 05/21/2019 1126   LYMPHSABS 2.4 10/26/2017 1002   MONOABS 1.3 (H) 05/21/2019 1126   MONOABS 0.2 10/26/2017 1002   EOSABS 0.0 05/21/2019 1126   EOSABS 0.1 10/26/2017 1002   BASOSABS 0.0 05/21/2019 1126   BASOSABS 0.0 10/26/2017 1002    . CMP Latest Ref Rng & Units 05/21/2019  05/06/2019 04/22/2019  Glucose 70 - 99 mg/dL 104(H) 105(H) 203(H)  BUN 8 - 23 mg/dL '16 15 17  '$ Creatinine 0.44 - 1.00 mg/dL 0.88 0.95 0.96  Sodium 135 - 145 mmol/L 140 138 136  Potassium 3.5 - 5.1 mmol/L 3.5 4.4 4.0  Chloride 98 - 111 mmol/L 106 110 104  CO2 22 - 32 mmol/L 22 21(L) 20(L)  Calcium 8.9 - 10.3 mg/dL 9.2 8.6(L) 8.9  Total Protein 6.5 - 8.1 g/dL 8.2(H) 8.2(H) 9.7(H)  Total Bilirubin 0.3 - 1.2 mg/dL 0.4 0.3 0.3  Alkaline Phos 38 - 126 U/L 60 60 61  AST 15 - 41 U/L '18 18 19  '$ ALT 0 - 44 U/L 33 29 33        03/01/18 Cytogenetics:   03/01/18 Bx:     11/27/18 BM Bx:     11/27/18 Cytogenetics:      RADIOGRAPHIC STUDIES: I have personally reviewed the radiological images as listed and agreed with the findings in the report. No results found. Normal DEXA scan in June 2015  Cytogenetic analysis: Revealed the presence of a normal female chromosomes with no observable clonal chromosomal abnormalities.   ASSESSMENT & PLAN:   73 y.o. Caucasian female in good overall health with  #1Smoldering Multiple Myeloma IgG lambda. Bone marrow plasma cells more than 10% ( noted to have 35% clonal plasma cells] Normal female karyotype cytogenetics with no other abnormalities SPEP shows M spike of 2.2 (slightly decrease/stabiliyt from previous levels) with IFE showing IgG lambda paraprotein. Associated with some Immunoparesis characterized by associated decrease in IgA and IgM.  M protein 2.6--> 2.6---> 2.2--->2.4--->2.3--> 2.6-->2.4->2.5--> 2.2--->3 -->2.7-->3.9-->4.1-->2.2  03/02/18 PET/CT revealed No hypermetabolic osseous lesion on today's study. No hypermetabolic soft tissue disease in the neck, chest, abdomen, pelvis, or lower extremities   03/05/18 cytogenetics report revealed that her plasma cells make up 35% of her cells.    11/27/18 BM Bx revealed slightly hypercellular bone marrow with plasma cell neoplasm and plasma cells comprising about 46% of all cells  11/27/18  PET/CT revealed No focal activity within the axillary appendicular skeleton to suggest active multiple myeloma. 2. No lytic skeletal lesions within the CT portion.   Last genetics revealed translocation 11;14, consistent with standard risk.   PLAN:  -Discussed pt labwork today, 05/21/19; M protein decreased after 2 cycles. Blood chemistries suggest that the pt is tolerating treatment well. Blood chemistries are WNL . -Discussed the increase of Lenalidomide dosage and the pt notes that she is having some headaches and is napping more. - Recommend OTC magnesium, oral Vit D 2000 units daily, and staying hydrated to help her hand cramping. -Recommend electrolyte rich fluids such as Gatorade, Powerade, or coconut water -Track myeloma labs after each cycle. -Repeat labs every 2 weeks. If they stabilize, repeat labs after each cycle instead. -Repeat labs next week and schedule phone visit 5 days after that  Labs in 2 weeks Phone visit with Dr Irene Limbo in 3 weeks  All off the patients questions were answered to her apparent satisfaction. The patient knows to call the clinic with any problems, questions or concerns.  She tolerates this well and without any noticeable  side effects.    The total time spent in the appt was 20 minutes and more than 50% was on counseling and direct patient cares.   Sullivan Lone MD [place] [facility]AHIV[facility] Hematology/Oncology Physician [place]  (Office):       [phone] (Work cell):  [phone] (Fax):           [phone]  By signing my name below, I, De Burrs, attest that this documentation has been prepared under the direction and in the presence of Irene Limbo, Cloria Spring, MD. Electronically Signed: De Burrs, Medical Scribe. 05/21/19. 1:56 PM.  .I have reviewed the above documentation for accuracy and completeness, and I agree with the above. Brunetta Genera MD

## 2019-06-04 ENCOUNTER — Other Ambulatory Visit: Payer: Self-pay

## 2019-06-04 ENCOUNTER — Inpatient Hospital Stay: Payer: Medicare Other

## 2019-06-04 DIAGNOSIS — C9000 Multiple myeloma not having achieved remission: Secondary | ICD-10-CM | POA: Diagnosis not present

## 2019-06-04 DIAGNOSIS — E559 Vitamin D deficiency, unspecified: Secondary | ICD-10-CM

## 2019-06-04 DIAGNOSIS — Z87891 Personal history of nicotine dependence: Secondary | ICD-10-CM | POA: Diagnosis not present

## 2019-06-04 DIAGNOSIS — R51 Headache: Secondary | ICD-10-CM | POA: Diagnosis not present

## 2019-06-04 DIAGNOSIS — Z7982 Long term (current) use of aspirin: Secondary | ICD-10-CM | POA: Diagnosis not present

## 2019-06-04 DIAGNOSIS — R252 Cramp and spasm: Secondary | ICD-10-CM | POA: Diagnosis not present

## 2019-06-04 LAB — CBC WITH DIFFERENTIAL/PLATELET
Abs Immature Granulocytes: 0.1 10*3/uL — ABNORMAL HIGH (ref 0.00–0.07)
Basophils Absolute: 0 10*3/uL (ref 0.0–0.1)
Basophils Relative: 0 %
Eosinophils Absolute: 0 10*3/uL (ref 0.0–0.5)
Eosinophils Relative: 0 %
HCT: 36 % (ref 36.0–46.0)
Hemoglobin: 12.3 g/dL (ref 12.0–15.0)
Immature Granulocytes: 1 %
Lymphocytes Relative: 23 %
Lymphs Abs: 2.5 10*3/uL (ref 0.7–4.0)
MCH: 33.5 pg (ref 26.0–34.0)
MCHC: 34.2 g/dL (ref 30.0–36.0)
MCV: 98.1 fL (ref 80.0–100.0)
Monocytes Absolute: 1.4 10*3/uL — ABNORMAL HIGH (ref 0.1–1.0)
Monocytes Relative: 13 %
Neutro Abs: 6.8 10*3/uL (ref 1.7–7.7)
Neutrophils Relative %: 63 %
Platelets: 304 10*3/uL (ref 150–400)
RBC: 3.67 MIL/uL — ABNORMAL LOW (ref 3.87–5.11)
RDW: 12.5 % (ref 11.5–15.5)
WBC: 10.7 10*3/uL — ABNORMAL HIGH (ref 4.0–10.5)
nRBC: 0 % (ref 0.0–0.2)

## 2019-06-04 LAB — CMP (CANCER CENTER ONLY)
ALT: 26 U/L (ref 0–44)
AST: 14 U/L — ABNORMAL LOW (ref 15–41)
Albumin: 3.6 g/dL (ref 3.5–5.0)
Alkaline Phosphatase: 73 U/L (ref 38–126)
Anion gap: 10 (ref 5–15)
BUN: 18 mg/dL (ref 8–23)
CO2: 23 mmol/L (ref 22–32)
Calcium: 9.5 mg/dL (ref 8.9–10.3)
Chloride: 106 mmol/L (ref 98–111)
Creatinine: 0.85 mg/dL (ref 0.44–1.00)
GFR, Est AFR Am: >60 mL/min (ref >60)
GFR, Estimated: >60 mL/min (ref >60)
Glucose, Bld: 108 mg/dL — ABNORMAL HIGH (ref 70–99)
Potassium: 3.7 mmol/L (ref 3.5–5.1)
Sodium: 139 mmol/L (ref 135–145)
Total Bilirubin: 0.4 mg/dL (ref 0.3–1.2)
Total Protein: 7.9 g/dL (ref 6.5–8.1)

## 2019-06-04 LAB — MAGNESIUM: Magnesium: 2.1 mg/dL (ref 1.7–2.4)

## 2019-06-05 LAB — VITAMIN D 25 HYDROXY (VIT D DEFICIENCY, FRACTURES): Vit D, 25-Hydroxy: 36.5 ng/mL (ref 30.0–100.0)

## 2019-06-06 LAB — MULTIPLE MYELOMA PANEL, SERUM
Albumin SerPl Elph-Mcnc: 3.7 g/dL (ref 2.9–4.4)
Albumin/Glob SerPl: 1.1 (ref 0.7–1.7)
Alpha 1: 0.3 g/dL (ref 0.0–0.4)
Alpha2 Glob SerPl Elph-Mcnc: 0.7 g/dL (ref 0.4–1.0)
B-Globulin SerPl Elph-Mcnc: 0.9 g/dL (ref 0.7–1.3)
Gamma Glob SerPl Elph-Mcnc: 1.7 g/dL (ref 0.4–1.8)
Globulin, Total: 3.5 g/dL (ref 2.2–3.9)
IgA: 12 mg/dL — ABNORMAL LOW (ref 64–422)
IgG (Immunoglobin G), Serum: 2161 mg/dL — ABNORMAL HIGH (ref 586–1602)
IgM (Immunoglobulin M), Srm: 5 mg/dL — ABNORMAL LOW (ref 26–217)
M Protein SerPl Elph-Mcnc: 1.4 g/dL — ABNORMAL HIGH
Total Protein ELP: 7.2 g/dL (ref 6.0–8.5)

## 2019-06-10 NOTE — Progress Notes (Signed)
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  HISTORY OF PRESENTING ILLNESS: please see my initial consultation for details of her initial presentation  INTERVAL HISTORY  I connected with Seward Carol on 06/11/2019 at  8:40 AM EDT by telephone and verified that I am speaking with the correct person using two identifiers.  I discussed the limitations, risks, security and privacy concerns of performing an evaluation and management service by telemedicine and the availability of in-person appointments. I also discussed with the patient that there may be a patient responsible charge related to this service. The patient expressed understanding and agreed to proceed.   Other persons participating in the visit and their role in the encounter: husband, Tailer Volkert  Patient's location: home Provider's location: my office at the [facility] returns today for management and evaluation of her Multiple Myeloma. The patient's last visit with [name] was on 05/21/2019. The pt reports that she is doing well overall.  The pt reports that she is still playing pickle ball and golfing. Since increasing her Revlimed dose, she has not noticed any changes. She had severe cramping several days ago after a day of physical activity in the sun, so she thinks she was dehydrated. She felt better after drinking coconut water.  She reports drinking 1-2 glasses of wine 2x/wk. She reports that her feet have been achey/sore, but they are not red and swollen. She takes Tylenol for mild headaches. She notes some tingling in her ankles, but not her hands and toes. Denies fever,  chills, and infection concerns.  Most recent lab results from 06/04/2019 of CBC w/diff and CMP is as follows: all values are WNL except for WBC at 10.7k, RBC at 3.67, monocytes abs at 1.4k, abs immature granulocytes at 0.10, glucose at 108, AST at 14,  06/04/2019 Vit D hydroxy at 36.5 06/04/2019 Magnesium at 2.1 06/04/2019 MMP revealed IgG monoclonal protein with lambda light chain specificity, M spike down to 1.4g/dl  On review of systems, pt reports staying physically active, staying hydrated, some tingling in her ankles, mild headaches, sore/achey feet and denies fever, chills, infection concerns, any other symptoms.   MEDICAL HISTORY:  Past Medical History:  Diagnosis Date  . Atrial fibrillation ([place]) 12/2010   PMH of ; [place] , [place] [facility]  . DJD (degenerative joint disease)   . History of blood transfusion 1275   complication after childbirth  . Osteoarthritis    "knees, hands, fingers, toes" (09/18/2018)  . PONV (postoperative nausea and vomiting) 09/2018   nausea only with knee surgery  . Skin cancer of face 2015   Patient reports this was likely basal cell on the right side of her face needing 14 stitches.  . Smoldering multiple myeloma ([place]) 2016   "pre bone cancer; being monitored for this q 4 months or so" (09/18/2018)  . Squamous cell carcinoma of scalp 2000   S/P MOHS   . Patient Active Problem List   Diagnosis Date Noted  . Chronic left shoulder pain 11/14/2018  . History of total right knee replacement 10/17/2018  . Osteoarthritis of right knee 09/18/2018  . Primary osteoarthritis of both knees 11/30/2017  .  Primary osteoarthritis of both feet 11/30/2017  . History of rotator cuff tear repair, bilateral 11/30/2017  . Primary osteoarthritis of right knee 08/23/2017  . Vitamin D deficiency 12/27/2015  . Smoldering multiple myeloma ([place]) 12/22/2015  . Hypergammaglobulinemia   . IgG monoclonal gammopathy of uncertain significance   . Abnormal gamma globulin level  04/14/2015  . Hyperlipidemia 04/03/2015  . Hyperglycemia 04/03/2015  . Diverticulosis of colon without hemorrhage 04/03/2015  . Arthralgia of multiple joints 04/03/2015  . Primary osteoarthritis of both hands 01/20/2010  . SKIN CANCER, HX OF 01/20/2010    SURGICAL HISTORY: Past Surgical History:  Procedure Laterality Date  . CATARACT EXTRACTION W/ INTRAOCULAR LENS  IMPLANT, BILATERAL Bilateral ~ 2015  . CHOLECYSTECTOMY OPEN  1990's  . COLONOSCOPY  02/2013   negative X 3; Dr Olevia Perches  . G 3 P 1    . HAMMER TOE SURGERY Left 2018   2nd digit  . JOINT REPLACEMENT    . MOHS SURGERY  ~ 2009   "back of my head"  . SHOULDER ARTHROSCOPY W/ ROTATOR CUFF REPAIR Right 2012    Dr Durward Fortes  . SHOULDER ARTHROSCOPY W/ ROTATOR CUFF REPAIR Left 06/2014   Dr. Sydnee Cabal for rotator cuff  . TONSILLECTOMY AND ADENOIDECTOMY    . TOTAL KNEE ARTHROPLASTY Right 09/18/2018  . TOTAL KNEE ARTHROPLASTY Right 09/18/2018   Procedure: RIGHT TOTAL KNEE ARTHROPLASTY;  Surgeon: Garald Balding, MD;  Location: [facility];  Service: Orthopedics;  Laterality: Right;  . WISDOM TOOTH EXTRACTION  1960s    SOCIAL HISTORY: Social History   Socioeconomic History  . Marital status: Married    Spouse name: Not on file  . Number of children: Not on file  . Years of education: Not on file  . Highest education level: Not on file  Occupational History  . Not on file  Social Needs  . Financial resource strain: Not on file  . Food insecurity    Worry: Not on file    Inability: Not on file  . Transportation needs    Medical: Not on file    Non-medical: Not on file  Tobacco Use  . Smoking status: Former Smoker    Packs/day: 0.25    Years: 9.00    Pack years: 2.25    Quit date: 11/07/1970    Years since quitting: 48.6  . Smokeless tobacco: Never Used  . Tobacco comment: 1/4 ppd [id number]  Substance and Sexual Activity  . Alcohol use: Yes    Alcohol/week: 3.0 standard drinks    Types: 3 Glasses of wine per week  .  Drug use: Never  . Sexual activity: Not Currently    Partners: Male  Lifestyle  . Physical activity    Days per week: Not on file    Minutes per session: Not on file  . Stress: Not on file  Relationships  . Social Herbalist on phone: Not on file    Gets together: Not on file    Attends religious service: Not on file    Active member of club or organization: Not on file    Attends meetings of clubs or organizations: Not on file    Relationship status: Not on file  . Intimate partner violence    Fear of current or ex partner: Not on file    Emotionally abused: Not on file    Physically abused: Not on file    Forced sexual activity: Not on file  Other Topics Concern  .  Not on file  Social History Narrative  . Not on file    FAMILY HISTORY: Family History  Problem Relation Age of Onset  . Atrial fibrillation Father   . Asthma Brother   . Alzheimer's disease Mother   . Myasthenia gravis Mother        ocular  . Breast cancer Paternal Grandmother   . Depression Daughter   . Colon cancer Neg Hx   . Rectal cancer Neg Hx   . Stomach cancer Neg Hx   . Diabetes Neg Hx   . Stroke Neg Hx   . Heart attack Neg Hx     ALLERGIES:  has No Known Allergies.  MEDICATIONS:  Current Outpatient Medications  Medication Sig Dispense Refill  . acyclovir (ZOVIRAX) 400 MG tablet Take 1 tablet (400 mg total) by mouth 2 (two) times daily. 60 tablet 6  . aspirin EC 81 MG tablet Take 81 mg by mouth daily.    Marland Kitchen dexamethasone (DECADRON) 4 MG tablet Take 10 tablets (40 mg total) by mouth once a week. With food. 40 tablet 3  . ixazomib citrate (NINLARO) 4 MG capsule Take 1 capsule (4 mg total) by mouth once a week. Take on an empty stomach 1hr before or 2hrs after food on Day 1,8,15 every 28 days. 3 capsule 3  . lenalidomide (REVLIMID) 25 MG capsule Take 1 capsule (25 mg total) by mouth daily. Start on Day 1 of cycle and take for 21 days then 7 days off 21 capsule 0  .  multivitamin-iron-minerals-folic acid (CENTRUM) chewable tablet Chew 1 tablet by mouth daily.    . ondansetron (ZOFRAN) 8 MG tablet Take 1 tablet (8 mg total) by mouth every 8 (eight) hours as needed for nausea or vomiting. Take 1 tab 30-49mns prior to Ninlaro 30 tablet 3   No current facility-administered medications for this visit.     REVIEW OF SYSTEMS:    A 10+ POINT REVIEW OF SYSTEMS WAS OBTAINED including neurology, dermatology, psychiatry, cardiac, respiratory, lymph, extremities, GI, GU, Musculoskeletal, constitutional, breasts, reproductive, HEENT.  All pertinent positives are noted in the HPI.  All others are negative.   PHYSICAL EXAMINATION: ECOG PERFORMANCE STATUS: 0-1 .LMP 11/07/1996   There were no vitals filed for this visit. .There is no height or weight on file to calculate BMI.  Phone Visit  LABORATORY DATA:  I have reviewed the data as listed  . CBC Latest Ref Rng & Units 06/04/2019 05/21/2019 05/06/2019  WBC 4.0 - 10.5 K/uL 10.7(H) 12.8(H) 5.0  Hemoglobin 12.0 - 15.0 g/dL 12.3 12.4 12.0  Hematocrit 36.0 - 46.0 % 36.0 37.1 35.1(L)  Platelets 150 - 400 K/uL 304 227 314   . CBC    Component Value Date/Time   WBC 10.7 (H) 06/04/2019 0844   RBC 3.67 (L) 06/04/2019 0844   HGB 12.3 06/04/2019 0844   HGB 11.3 (L) 03/22/2019 0940   HGB 12.4 10/26/2017 1002   HCT 36.0 06/04/2019 0844   HCT 36.2 10/26/2017 1002   PLT 304 06/04/2019 0844   PLT 230 03/22/2019 0940   PLT 235 10/26/2017 1002   MCV 98.1 06/04/2019 0844   MCV 96.0 10/26/2017 1002   MCH 33.5 06/04/2019 0844   MCHC 34.2 06/04/2019 0844   RDW 12.5 06/04/2019 0844   RDW 12.7 10/26/2017 1002   LYMPHSABS 2.5 06/04/2019 0844   LYMPHSABS 2.4 10/26/2017 1002   MONOABS 1.4 (H) 06/04/2019 0844   MONOABS 0.2 10/26/2017 1002   EOSABS 0.0 06/04/2019 0844  EOSABS 0.1 10/26/2017 1002   BASOSABS 0.0 06/04/2019 0844   BASOSABS 0.0 10/26/2017 1002    . CMP Latest Ref Rng & Units 06/04/2019 05/21/2019  05/06/2019  Glucose 70 - 99 mg/dL 108(H) 104(H) 105(H)  BUN 8 - 23 mg/dL '18 16 15  '$ Creatinine 0.44 - 1.00 mg/dL 0.85 0.88 0.95  Sodium 135 - 145 mmol/L 139 140 138  Potassium 3.5 - 5.1 mmol/L 3.7 3.5 4.4  Chloride 98 - 111 mmol/L 106 106 110  CO2 22 - 32 mmol/L 23 22 21(L)  Calcium 8.9 - 10.3 mg/dL 9.5 9.2 8.6(L)  Total Protein 6.5 - 8.1 g/dL 7.9 8.2(H) 8.2(H)  Total Bilirubin 0.3 - 1.2 mg/dL 0.4 0.4 0.3  Alkaline Phos 38 - 126 U/L 73 60 60  AST 15 - 41 U/L 14(L) 18 18  ALT 0 - 44 U/L 26 33 29    06/04/2019 MMP Component     Latest Ref Rng & Units 06/04/2019  IgG (Immunoglobin G), Serum     586 - 1,602 mg/dL 2,161 (H)  IgA     64 - 422 mg/dL 12 (L)  IgM (Immunoglobulin M), Srm     26 - 217 mg/dL 5 (L)  Total Protein ELP     6.0 - 8.5 g/dL 7.2  Albumin SerPl Elph-Mcnc     2.9 - 4.4 g/dL 3.7  Alpha 1     0.0 - 0.4 g/dL 0.3  Alpha2 Glob SerPl Elph-Mcnc     0.4 - 1.0 g/dL 0.7  B-Globulin SerPl Elph-Mcnc     0.7 - 1.3 g/dL 0.9  Gamma Glob SerPl Elph-Mcnc     0.4 - 1.8 g/dL 1.7  M Protein SerPl Elph-Mcnc     Not Observed g/dL 1.4 (H)  Globulin, Total     2.2 - 3.9 g/dL 3.5  Albumin/Glob SerPl     0.7 - 1.7 1.1  IFE 1      Comment  Please Note (HCV):      Comment   03/01/18 Cytogenetics:   03/01/18 Bx:     11/27/18 BM Bx:     11/27/18 Cytogenetics:      RADIOGRAPHIC STUDIES: I have personally reviewed the radiological images as listed and agreed with the findings in the report. No results found. Normal DEXA scan in June 2015  Cytogenetic analysis: Revealed the presence of a normal female chromosomes with no observable clonal chromosomal abnormalities.   ASSESSMENT & PLAN:   73 y.o. Caucasian female in good overall health with  #1Smoldering Multiple Myeloma IgG lambda. Bone marrow plasma cells more than 10% ( noted to have 35% clonal plasma cells] Normal female karyotype cytogenetics with no other abnormalities SPEP shows M spike of 2.2 (slightly  decrease/stabiliyt from previous levels) with IFE showing IgG lambda paraprotein. Associated with some Immunoparesis characterized by associated decrease in IgA and IgM.  M protein 2.6--> 2.6---> 2.2--->2.4--->2.3--> 2.6-->2.4->2.5--> 2.2--->3 -->2.7-->3.9-->4.1-->2.2-->1.4  03/02/18 PET/CT revealed No hypermetabolic osseous lesion on today's study. No hypermetabolic soft tissue disease in the neck, chest, abdomen, pelvis, or lower extremities   03/05/18 cytogenetics report revealed that her plasma cells make up 35% of her cells.    11/27/18 BM Bx revealed slightly hypercellular bone marrow with plasma cell neoplasm and plasma cells comprising about 46% of all cells  11/27/18 PET/CT revealed No focal activity within the axillary appendicular skeleton to suggest active multiple myeloma. 2. No lytic skeletal lesions within the CT portion.   Last genetics revealed translocation 11;14, consistent with standard risk.  PLAN:  -Discussed most recent labwork from 06/04/2019; Vit D is low normal, M protein has decreased to 1.4, total protein has normalized -Goal of treatment is a 90% M protein reduction to 0.4-0.5. Most recent M protein is 1.4. -Plan for up to 6 cycles of Revlimid, Dexamethasone and Ninlaro, maybe more if the pt continues to respond to and tolerate treatment.  -Discussed that after the patient reaches "very good partial response," the treatment options include: maintenance treatment with low dose Revlimed OR autologous transplant -The pt has no prohibitive toxicities from continuing C4 of Revlimid, Dexamethasone and Ninlaro, at this time. -Recommended that the pt continue to eat well, drink at least 48-64 oz of water each day, and walk 20-30 minutes each day. -Begin taking PO Vit D replacement 2000 units daily to combat muscle cramping -Continue 81 mg baby ASA daily -Will see the pt back in 4 weeks by phone with labs one week prior   -Labs in 3 weeks -Phone visit with Dr Irene Limbo in 4  weeks   All off the patients questions were answered to her apparent satisfaction. The patient knows to call the clinic with any problems, questions or concerns.    The total time spent in the appt was 20 minutes and more than 50% was on counseling and direct patient cares.   Sullivan Lone MD [place] AAHIVMS [facility] Hematology/Oncology Physician [place]  (Office):       [phone] (Work cell):  [phone] (Fax):           [phone]  I, De Burrs, am acting as a scribe for Dr. Irene Limbo  .I have reviewed the above documentation for accuracy and completeness, and I agree with the above. Brunetta Genera MD

## 2019-06-11 ENCOUNTER — Telehealth: Payer: Self-pay | Admitting: Hematology

## 2019-06-11 ENCOUNTER — Inpatient Hospital Stay: Payer: Medicare Other | Attending: Hematology | Admitting: Hematology

## 2019-06-11 DIAGNOSIS — Z87891 Personal history of nicotine dependence: Secondary | ICD-10-CM | POA: Diagnosis not present

## 2019-06-11 DIAGNOSIS — C9000 Multiple myeloma not having achieved remission: Secondary | ICD-10-CM | POA: Diagnosis not present

## 2019-06-11 DIAGNOSIS — Z79899 Other long term (current) drug therapy: Secondary | ICD-10-CM | POA: Diagnosis not present

## 2019-06-11 DIAGNOSIS — D892 Hypergammaglobulinemia, unspecified: Secondary | ICD-10-CM | POA: Insufficient documentation

## 2019-06-11 DIAGNOSIS — Z7982 Long term (current) use of aspirin: Secondary | ICD-10-CM | POA: Insufficient documentation

## 2019-06-11 DIAGNOSIS — M1990 Unspecified osteoarthritis, unspecified site: Secondary | ICD-10-CM | POA: Insufficient documentation

## 2019-06-11 DIAGNOSIS — I4891 Unspecified atrial fibrillation: Secondary | ICD-10-CM | POA: Insufficient documentation

## 2019-06-11 DIAGNOSIS — Z85828 Personal history of other malignant neoplasm of skin: Secondary | ICD-10-CM | POA: Insufficient documentation

## 2019-06-11 NOTE — Telephone Encounter (Signed)
Scheduled appt per 8/4 los.  Spoke with patient and she is aware of her appt date and time.

## 2019-06-20 ENCOUNTER — Other Ambulatory Visit: Payer: Self-pay | Admitting: Hematology

## 2019-06-20 ENCOUNTER — Other Ambulatory Visit: Payer: Self-pay | Admitting: *Deleted

## 2019-06-20 DIAGNOSIS — C9000 Multiple myeloma not having achieved remission: Secondary | ICD-10-CM

## 2019-06-20 MED ORDER — LENALIDOMIDE 25 MG PO CAPS: 25.0000 mg | ORAL_CAPSULE | Freq: Every day | ORAL | 0 refills | Status: DC

## 2019-06-20 NOTE — Telephone Encounter (Signed)
Refill per Dr. Irene Limbo OV note 06/11/2019 Refill sent to [facility], [place] # [id number], 06/20/2019

## 2019-06-20 NOTE — Telephone Encounter (Signed)
Refill per Dr. Irene Limbo OV note 06/11/2019 Refill sent to [facility], [place] # [id number], 06/20/2019

## 2019-06-24 ENCOUNTER — Telehealth: Payer: Self-pay | Admitting: Pharmacist

## 2019-06-24 ENCOUNTER — Other Ambulatory Visit: Payer: Self-pay | Admitting: *Deleted

## 2019-06-24 MED ORDER — DEXAMETHASONE 4 MG PO TABS: 40.0000 mg | ORAL_TABLET | ORAL | 3 refills | Status: AC

## 2019-06-24 NOTE — Telephone Encounter (Signed)
Oral Chemotherapy Pharmacist Encounter   Spoke with patient today to follow up regarding patient's oral chemotherapy medication: Revlimid (lenalidomide)in conjunction with Ninlaro (ixazomib) and dexamethasonefor the treatment of multiple myeloma not having achieved remission, planned durationuntil adequate disease control or unacceptable toxicity  Original Start date of oral chemotherapy: Ninlaro: 03/11/2019, Revlimid: 04/08/2019  Pt reports 0 tablets/doses of Ninlaro, Revlimid, or dexamethasone missed in the last month.   Pt reports the following side effects:   Patient endorses some episodes of dizziness that occur intermittently. They are manageable. Patient informed there is a ~20% incidence of dizziness associated with the use of Revlimid. They will keep the office posted about frequency and intensity. Patient states she drinks lots of fluids daily.  Pertinent labs reviewed: Okay for continued treatment.  Patient would like to start melatonin and vitamin B6 supplemets, no interactions noted. Patient transferred to Dr. Grier Mitts collaborative practice RN to leave a message about refills for dexamethasone and acyclovir, as well as changing a lab appointment.  All questions answered. Patient knows to call the office with questions or concerns.  Johny Drilling, PharmD, BCPS, BCOP  06/24/2019  2:35 PM  O[facility] Oncology Clinic [phone]

## 2019-06-24 NOTE — Telephone Encounter (Signed)
Requested refills of Dexamethasone and Acyclovir. Per [facility] pharmacy, has 3 refills left for Acyclovir, they will fill now. Refill sent for Dexamethasone to Dr. Irene Limbo

## 2019-06-26 ENCOUNTER — Telehealth: Payer: Self-pay | Admitting: *Deleted

## 2019-06-26 ENCOUNTER — Encounter: Payer: Self-pay | Admitting: Hematology

## 2019-06-26 NOTE — Telephone Encounter (Signed)
Due to another appointment, changed her appt for labs from 8/25 to 8/20. Changed ok'd by Dr.Kale.

## 2019-06-27 ENCOUNTER — Other Ambulatory Visit: Payer: Self-pay

## 2019-06-27 ENCOUNTER — Inpatient Hospital Stay: Payer: Medicare Other

## 2019-06-27 ENCOUNTER — Encounter: Payer: Self-pay | Admitting: Hematology

## 2019-06-27 DIAGNOSIS — Z7982 Long term (current) use of aspirin: Secondary | ICD-10-CM | POA: Diagnosis not present

## 2019-06-27 DIAGNOSIS — Z79899 Other long term (current) drug therapy: Secondary | ICD-10-CM | POA: Diagnosis not present

## 2019-06-27 DIAGNOSIS — D892 Hypergammaglobulinemia, unspecified: Secondary | ICD-10-CM | POA: Diagnosis not present

## 2019-06-27 DIAGNOSIS — Z85828 Personal history of other malignant neoplasm of skin: Secondary | ICD-10-CM | POA: Diagnosis not present

## 2019-06-27 DIAGNOSIS — C9000 Multiple myeloma not having achieved remission: Secondary | ICD-10-CM | POA: Diagnosis not present

## 2019-06-27 DIAGNOSIS — Z87891 Personal history of nicotine dependence: Secondary | ICD-10-CM | POA: Diagnosis not present

## 2019-06-27 DIAGNOSIS — M1990 Unspecified osteoarthritis, unspecified site: Secondary | ICD-10-CM | POA: Diagnosis not present

## 2019-06-27 DIAGNOSIS — I4891 Unspecified atrial fibrillation: Secondary | ICD-10-CM | POA: Diagnosis not present

## 2019-06-27 LAB — CBC WITH DIFFERENTIAL/PLATELET
Abs Immature Granulocytes: 0.02 10*3/uL (ref 0.00–0.07)
Basophils Absolute: 0.1 10*3/uL (ref 0.0–0.1)
Basophils Relative: 1 %
Eosinophils Absolute: 0.2 10*3/uL (ref 0.0–0.5)
Eosinophils Relative: 3 %
HCT: 36.4 % (ref 36.0–46.0)
Hemoglobin: 12.2 g/dL (ref 12.0–15.0)
Immature Granulocytes: 0 %
Lymphocytes Relative: 34 %
Lymphs Abs: 1.8 10*3/uL (ref 0.7–4.0)
MCH: 33.1 pg (ref 26.0–34.0)
MCHC: 33.5 g/dL (ref 30.0–36.0)
MCV: 98.6 fL (ref 80.0–100.0)
Monocytes Absolute: 0.5 10*3/uL (ref 0.1–1.0)
Monocytes Relative: 10 %
Neutro Abs: 2.9 10*3/uL (ref 1.7–7.7)
Neutrophils Relative %: 52 %
Platelets: 182 10*3/uL (ref 150–400)
RBC: 3.69 MIL/uL — ABNORMAL LOW (ref 3.87–5.11)
RDW: 12.9 % (ref 11.5–15.5)
WBC: 5.5 10*3/uL (ref 4.0–10.5)
nRBC: 0 % (ref 0.0–0.2)

## 2019-06-27 LAB — CMP (CANCER CENTER ONLY)
ALT: 42 U/L (ref 0–44)
AST: 24 U/L (ref 15–41)
Albumin: 3.6 g/dL (ref 3.5–5.0)
Alkaline Phosphatase: 55 U/L (ref 38–126)
Anion gap: 11 (ref 5–15)
BUN: 18 mg/dL (ref 8–23)
CO2: 22 mmol/L (ref 22–32)
Calcium: 8.9 mg/dL (ref 8.9–10.3)
Chloride: 106 mmol/L (ref 98–111)
Creatinine: 0.92 mg/dL (ref 0.44–1.00)
GFR, Est AFR Am: >60 mL/min (ref >60)
GFR, Estimated: >60 mL/min (ref >60)
Glucose, Bld: 126 mg/dL — ABNORMAL HIGH (ref 70–99)
Potassium: 4 mmol/L (ref 3.5–5.1)
Sodium: 139 mmol/L (ref 135–145)
Total Bilirubin: 0.6 mg/dL (ref 0.3–1.2)
Total Protein: 7.1 g/dL (ref 6.5–8.1)

## 2019-06-28 LAB — MULTIPLE MYELOMA PANEL, SERUM
Albumin SerPl Elph-Mcnc: 3.6 g/dL (ref 2.9–4.4)
Albumin/Glob SerPl: 1.3 (ref 0.7–1.7)
Alpha 1: 0.2 g/dL (ref 0.0–0.4)
Alpha2 Glob SerPl Elph-Mcnc: 0.6 g/dL (ref 0.4–1.0)
B-Globulin SerPl Elph-Mcnc: 0.8 g/dL (ref 0.7–1.3)
Gamma Glob SerPl Elph-Mcnc: 1.4 g/dL (ref 0.4–1.8)
Globulin, Total: 3 g/dL (ref 2.2–3.9)
IgA: 12 mg/dL — ABNORMAL LOW (ref 64–422)
IgG (Immunoglobin G), Serum: 1595 mg/dL (ref 586–1602)
IgM (Immunoglobulin M), Srm: 5 mg/dL — ABNORMAL LOW (ref 26–217)
M Protein SerPl Elph-Mcnc: 1.1 g/dL — ABNORMAL HIGH
Total Protein ELP: 6.6 g/dL (ref 6.0–8.5)

## 2019-06-28 LAB — KAPPA/LAMBDA LIGHT CHAINS
Kappa free light chain: 8.9 mg/L (ref 3.3–19.4)
Kappa, lambda light chain ratio: 0.8 (ref 0.26–1.65)
Lambda free light chains: 11.1 mg/L (ref 5.7–26.3)

## 2019-07-02 ENCOUNTER — Other Ambulatory Visit: Payer: Medicare Other

## 2019-07-10 ENCOUNTER — Telehealth: Payer: Self-pay | Admitting: Hematology

## 2019-07-10 ENCOUNTER — Encounter: Payer: Self-pay | Admitting: Hematology

## 2019-07-10 ENCOUNTER — Inpatient Hospital Stay: Payer: Medicare Other | Attending: Hematology | Admitting: Hematology

## 2019-07-10 DIAGNOSIS — C9000 Multiple myeloma not having achieved remission: Secondary | ICD-10-CM | POA: Diagnosis not present

## 2019-07-10 DIAGNOSIS — Z23 Encounter for immunization: Secondary | ICD-10-CM | POA: Insufficient documentation

## 2019-07-10 DIAGNOSIS — I4891 Unspecified atrial fibrillation: Secondary | ICD-10-CM | POA: Insufficient documentation

## 2019-07-10 DIAGNOSIS — Z7982 Long term (current) use of aspirin: Secondary | ICD-10-CM | POA: Diagnosis not present

## 2019-07-10 DIAGNOSIS — Z87891 Personal history of nicotine dependence: Secondary | ICD-10-CM

## 2019-07-10 DIAGNOSIS — D892 Hypergammaglobulinemia, unspecified: Secondary | ICD-10-CM | POA: Insufficient documentation

## 2019-07-10 DIAGNOSIS — Z79899 Other long term (current) drug therapy: Secondary | ICD-10-CM | POA: Diagnosis not present

## 2019-07-10 DIAGNOSIS — Z85828 Personal history of other malignant neoplasm of skin: Secondary | ICD-10-CM | POA: Insufficient documentation

## 2019-07-10 DIAGNOSIS — M1990 Unspecified osteoarthritis, unspecified site: Secondary | ICD-10-CM | POA: Insufficient documentation

## 2019-07-10 NOTE — Telephone Encounter (Signed)
Scheduled appt per 9/2 los.  Spoke with the patient and she is aware of her appt date and time.

## 2019-07-10 NOTE — Progress Notes (Signed)
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  HISTORY OF PRESENTING ILLNESS: please see my initial consultation for details of her initial presentation  INTERVAL HISTORY  I connected with  Seward Carol on 07/10/19 by a video enabled telemedicine application and verified that I am speaking with the correct person using two identifiers.   I discussed the limitations of evaluation and management by telemedicine. The patient expressed understanding and agreed to proceed.  Other persons participating in the visit and their role in the encounter:     -Yevette Edwards, Medical Scribe    -Pt's husband   Patient's location: Home Provider's location: [place] [facility] returns today for management and evaluation of her Multiple Myeloma. The patient's last visit with [name] was on 06/11/2019. The pt reports that she is doing well overall.  The pt reports that she feels very good and has recently finished her fourth cycle. Her hair has thinned out but she has not lost a lot of it. She has been playing more Pickleball and remaining active. Pt reports that she is not currently experiencing muscle spasms. She has also been eating well. She and her husband are interested in being referred to [facility] for a transplant.   Pt has been taking Acyclovir and Asprin regularly. Pt has recently began taking a supplement that includes Melatonin and some Vitamin B. She reports that this supplement has helped her sleep, along with her added exercise.   Lab results (06/27/19) of CBC w/diff and CMP is as follows: all values are WNL except for RBC at 3.69,  Glucose at 126. 06/27/2019 K/L light chains show Kappa free light chain at 8.9, Lamda free light chains at 11.1, Kappa lamda light chain rati at 0.80  06/27/2019 MMP all values are WNL except for IgA at 12, IgM at 5, M Protein at 1.1.             On review of systems, pt reports denies muscle spasms and any other symptoms.   MEDICAL HISTORY:  Past Medical History:  Diagnosis Date  . Atrial fibrillation ([place]) 12/2010   PMH of ; [place] , [place] [facility]  . DJD (degenerative joint disease)   . History of blood transfusion 6967   complication after childbirth  . Osteoarthritis    "knees, hands, fingers, toes" (09/18/2018)  . PONV (postoperative nausea and vomiting) 09/2018   nausea only with knee surgery  . Skin cancer of face 2015   Patient reports this was likely basal cell on the right side of her face needing 14 stitches.  . Smoldering multiple myeloma ([place]) 2016   "pre bone cancer; being monitored for this q 4 months or so" (09/18/2018)  . Squamous cell carcinoma of scalp 2000   S/P MOHS   . Patient Active Problem List   Diagnosis Date Noted  . Chronic left shoulder pain 11/14/2018  . History of total right knee replacement 10/17/2018  . Osteoarthritis of right knee 09/18/2018  . Primary osteoarthritis of both knees 11/30/2017  . Primary osteoarthritis of both feet 11/30/2017  . History of rotator cuff tear repair, bilateral 11/30/2017  . Primary osteoarthritis of  right knee 08/23/2017  . Vitamin D deficiency 12/27/2015  . Smoldering multiple myeloma ([place]) 12/22/2015  . Hypergammaglobulinemia   . IgG monoclonal gammopathy of uncertain significance   . Abnormal gamma globulin level 04/14/2015  . Hyperlipidemia 04/03/2015  . Hyperglycemia 04/03/2015  . Diverticulosis of colon without hemorrhage 04/03/2015  . Arthralgia of multiple joints 04/03/2015  . Primary osteoarthritis of both hands 01/20/2010  . SKIN CANCER, HX OF 01/20/2010    SURGICAL HISTORY: Past Surgical History:   Procedure Laterality Date  . CATARACT EXTRACTION W/ INTRAOCULAR LENS  IMPLANT, BILATERAL Bilateral ~ 2015  . CHOLECYSTECTOMY OPEN  1990's  . COLONOSCOPY  02/2013   negative X 3; Dr Olevia Perches  . G 3 P 1    . HAMMER TOE SURGERY Left 2018   2nd digit  . JOINT REPLACEMENT    . MOHS SURGERY  ~ 2009   "back of my head"  . SHOULDER ARTHROSCOPY W/ ROTATOR CUFF REPAIR Right 2012    Dr Durward Fortes  . SHOULDER ARTHROSCOPY W/ ROTATOR CUFF REPAIR Left 06/2014   Dr. Sydnee Cabal for rotator cuff  . TONSILLECTOMY AND ADENOIDECTOMY    . TOTAL KNEE ARTHROPLASTY Right 09/18/2018  . TOTAL KNEE ARTHROPLASTY Right 09/18/2018   Procedure: RIGHT TOTAL KNEE ARTHROPLASTY;  Surgeon: Garald Balding, MD;  Location: [facility];  Service: Orthopedics;  Laterality: Right;  . WISDOM TOOTH EXTRACTION  1960s    SOCIAL HISTORY: Social History   Socioeconomic History  . Marital status: Married    Spouse name: Not on file  . Number of children: Not on file  . Years of education: Not on file  . Highest education level: Not on file  Occupational History  . Not on file  Social Needs  . Financial resource strain: Not on file  . Food insecurity    Worry: Not on file    Inability: Not on file  . Transportation needs    Medical: Not on file    Non-medical: Not on file  Tobacco Use  . Smoking status: Former Smoker    Packs/day: 0.25    Years: 9.00    Pack years: 2.25    Quit date: 11/07/1970    Years since quitting: 48.7  . Smokeless tobacco: Never Used  . Tobacco comment: 1/4 ppd [id number]  Substance and Sexual Activity  . Alcohol use: Yes    Alcohol/week: 3.0 standard drinks    Types: 3 Glasses of wine per week  . Drug use: Never  . Sexual activity: Not Currently    Partners: Male  Lifestyle  . Physical activity    Days per week: Not on file    Minutes per session: Not on file  . Stress: Not on file  Relationships  . Social Herbalist on phone: Not on file    Gets together: Not on file    Attends  religious service: Not on file    Active member of club or organization: Not on file    Attends meetings of clubs or organizations: Not on file    Relationship status: Not on file  . Intimate partner violence    Fear of current or ex partner: Not on file    Emotionally abused: Not on file    Physically abused: Not on file    Forced sexual activity: Not on file  Other Topics Concern  . Not on file  Social History Narrative  . Not on file    FAMILY HISTORY: Family History  Problem  Relation Age of Onset  . Atrial fibrillation Father   . Asthma Brother   . Alzheimer's disease Mother   . Myasthenia gravis Mother        ocular  . Breast cancer Paternal Grandmother   . Depression Daughter   . Colon cancer Neg Hx   . Rectal cancer Neg Hx   . Stomach cancer Neg Hx   . Diabetes Neg Hx   . Stroke Neg Hx   . Heart attack Neg Hx     ALLERGIES:  has No Known Allergies.  MEDICATIONS:  Current Outpatient Medications  Medication Sig Dispense Refill  . acyclovir (ZOVIRAX) 400 MG tablet Take 1 tablet (400 mg total) by mouth 2 (two) times daily. 60 tablet 6  . aspirin EC 81 MG tablet Take 81 mg by mouth daily.    Marland Kitchen dexamethasone (DECADRON) 4 MG tablet Take 10 tablets (40 mg total) by mouth once a week. With food. 40 tablet 3  . ixazomib citrate (NINLARO) 4 MG capsule Take 1 capsule (4 mg total) by mouth once a week. Take on an empty stomach 1hr before or 2hrs after food on Day 1,8,15 every 28 days. 3 capsule 3  . lenalidomide (REVLIMID) 25 MG capsule Take 1 capsule (25 mg total) by mouth daily. Start on Day 1 of cycle and take for 21 days then 7 days off 21 capsule 0  . multivitamin-iron-minerals-folic acid (CENTRUM) chewable tablet Chew 1 tablet by mouth daily.    . ondansetron (ZOFRAN) 8 MG tablet Take 1 tablet (8 mg total) by mouth every 8 (eight) hours as needed for nausea or vomiting. Take 1 tab 30-58mns prior to Ninlaro 30 tablet 3   No current facility-administered medications for  this visit.     REVIEW OF SYSTEMS:    A 10+ POINT REVIEW OF SYSTEMS WAS OBTAINED including neurology, dermatology, psychiatry, cardiac, respiratory, lymph, extremities, GI, GU, Musculoskeletal, constitutional, breasts, reproductive, HEENT.  All pertinent positives are noted in the HPI.  All others are negative.   PHYSICAL EXAMINATION: ECOG PERFORMANCE STATUS: 0-1 .LMP 11/07/1996   There were no vitals filed for this visit. .There is no height or weight on file to calculate BMI.  Telehealth Visit  LABORATORY DATA:  I have reviewed the data as listed  . CBC Latest Ref Rng & Units 06/27/2019 06/04/2019 05/21/2019  WBC 4.0 - 10.5 K/uL 5.5 10.7(H) 12.8(H)  Hemoglobin 12.0 - 15.0 g/dL 12.2 12.3 12.4  Hematocrit 36.0 - 46.0 % 36.4 36.0 37.1  Platelets 150 - 400 K/uL 182 304 227   . CBC    Component Value Date/Time   WBC 5.5 06/27/2019 1207   RBC 3.69 (L) 06/27/2019 1207   HGB 12.2 06/27/2019 1207   HGB 11.3 (L) 03/22/2019 0940   HGB 12.4 10/26/2017 1002   HCT 36.4 06/27/2019 1207   HCT 36.2 10/26/2017 1002   PLT 182 06/27/2019 1207   PLT 230 03/22/2019 0940   PLT 235 10/26/2017 1002   MCV 98.6 06/27/2019 1207   MCV 96.0 10/26/2017 1002   MCH 33.1 06/27/2019 1207   MCHC 33.5 06/27/2019 1207   RDW 12.9 06/27/2019 1207   RDW 12.7 10/26/2017 1002   LYMPHSABS 1.8 06/27/2019 1207   LYMPHSABS 2.4 10/26/2017 1002   MONOABS 0.5 06/27/2019 1207   MONOABS 0.2 10/26/2017 1002   EOSABS 0.2 06/27/2019 1207   EOSABS 0.1 10/26/2017 1002   BASOSABS 0.1 06/27/2019 1207   BASOSABS 0.0 10/26/2017 1002    . CMP Latest  Ref Rng & Units 06/27/2019 06/04/2019 05/21/2019  Glucose 70 - 99 mg/dL 126(H) 108(H) 104(H)  BUN 8 - 23 mg/dL _0 Creatinine 0.44 - 1.00 mg/dL 0.92 0.85 0.88  Sodium 135 - 145 mmol/L 139 139 140  Potassium 3.5 - 5.1 mmol/L 4.0 3.7 3.5  Chloride 98 - 111 mmol/L 106 106 106  CO2 22 - 32 mmol/L _1 Calcium 8.9 - 10.3 mg/dL 8.9 9.5 9.2  Total Protein 6.5 - 8.1  g/dL 7.1 7.9 8.2(H)  Total Bilirubin 0.3 - 1.2 mg/dL 0.6 0.4 0.4  Alkaline Phos 38 - 126 U/L 55 73 60  AST 15 - 41 U/L 24 14(L) 18  ALT 0 - 44 U/L 42 26 33     03/01/18 Cytogenetics:   03/01/18 Bx:     11/27/18 BM Bx:     11/27/18 Cytogenetics:      RADIOGRAPHIC STUDIES: I have personally reviewed the radiological images as listed and agreed with the findings in the report. No results found. Normal DEXA scan in June 2015  Cytogenetic analysis: Revealed the presence of a normal female chromosomes with no observable clonal chromosomal abnormalities.   ASSESSMENT & PLAN:   73 y.o. Caucasian female in good overall health with  #1Smoldering Multiple Myeloma IgG lambda. Bone marrow plasma cells more than 10% ( noted to have 35% clonal plasma cells] Normal female karyotype cytogenetics with no other abnormalities SPEP shows M spike of 2.2 (slightly decrease/stabiliyt from previous levels) with IFE showing IgG lambda paraprotein. Associated with some Immunoparesis characterized by associated decrease in IgA and IgM.  M protein 2.6--> 2.6---> 2.2--->2.4--->2.3--> 2.6-->2.4->2.5--> 2.2--->3 -->2.7-->3.9-->4.1-->2.2-->1.4--> 1.1  03/02/18 PET/CT revealed No hypermetabolic osseous lesion on today's study. No hypermetabolic soft tissue disease in the neck, chest, abdomen, pelvis, or lower extremities   03/05/18 cytogenetics report revealed that her plasma cells make up 35% of her cells.    11/27/18 BM Bx revealed slightly hypercellular bone marrow with plasma cell neoplasm and plasma cells comprising about 46% of all cells  11/27/18 PET/CT revealed No focal activity within the axillary appendicular skeleton to suggest active multiple myeloma. 2. No lytic skeletal lesions within the CT portion.   Last genetics revealed translocation 11;14, consistent with standard risk.   PLAN:  -Discussed pt labwork,  06/27/19; all values are WNL except for RBC at 3.69, Glucose at 126. -Discussed  06/27/2019 K/L light chains show Kappa free light chain at 8.9, Lamda free light chains at 11.1, Kappa lamda light chain rati at 0.80. K/L light chains have normalized.  -Discussed 06/27/2019 MMP all values are WNL except for IgA at 12, IgM at 5, M Protein at 1.1. M Protein has about 75% response.  -Goal of treatment is a 90% M protein reduction to 0.4-0.5. Most recent M protein is 1.1.  -The pt has no prohibitive toxicities from continuing C5 of Ninlaro, Dexamethasone and Revlimid at this time. -Plan for up to 6 cycles of Revlimid, Dexamethasone and Ninlaro, maybe more if the pt continues to respond to and tolerate treatment.  -Discussed that after the patient reaches "very good partial response," the treatment options include: maintenance treatment with low dose Revlimed OR autologous transplant -Pt has achieved a "partial response" to therapy at this time  -Will offer referral to Dr. Norma Fredrickson at for transplant consultation after confirmation with pt's insurance -Advised that transplant procedures have changed due to the current pandemic -Begin taking PO Vit D replacement 2000 units daily to combat muscle cramping -Continue  81 mg baby ASA daily -Will see back in 4 weeks via phone with labs in 3 weeks  FOLLOW UP: -Labs in 3 weeks -Phone visit with Dr Irene Limbo in 4 weeks   The total time spent in the appt was 20 minutes and more than 50% was on counseling and direct patient cares.  All of the patient's questions were answered with apparent satisfaction. The patient knows to call the clinic with any problems, questions or concerns.   Sullivan Lone MD [place] AAHIVMS [facility] Hematology/Oncology Physician [place]  (Office):       [phone] (Work cell):  [phone] (Fax):           [phone]  I, Yevette Edwards, am acting as a scribe for Dr. Sullivan Lone.   .I have reviewed the above documentation for accuracy and completeness, and I agree with the above. Brunetta Genera MD

## 2019-07-11 ENCOUNTER — Telehealth: Payer: Self-pay | Admitting: *Deleted

## 2019-07-11 NOTE — Telephone Encounter (Signed)
Contacted Christy at Dr. Norma Fredrickson office [phone] to refer patient for transplant consideration per Dr. Irene Limbo. Faxed demographics, most recent OV note and  Pathology info. They will contact patient for appointment. Sent patient message in MyChart to expect call from their office.

## 2019-07-16 ENCOUNTER — Other Ambulatory Visit: Payer: Self-pay | Admitting: *Deleted

## 2019-07-16 ENCOUNTER — Other Ambulatory Visit: Payer: Self-pay | Admitting: Hematology

## 2019-07-16 DIAGNOSIS — C9000 Multiple myeloma not having achieved remission: Secondary | ICD-10-CM

## 2019-07-16 MED ORDER — LENALIDOMIDE 25 MG PO CAPS: 25.0000 mg | ORAL_CAPSULE | Freq: Every day | ORAL | 0 refills | Status: DC

## 2019-07-16 NOTE — Telephone Encounter (Signed)
Refilled Revlimid per Dr. Irene Limbo OV note 07/10/2019 Refill sent to [facility], [address] [place], [place] # [id number], 07/16/2019

## 2019-07-17 ENCOUNTER — Telehealth: Payer: Self-pay | Admitting: Pharmacist

## 2019-07-17 DIAGNOSIS — Z23 Encounter for immunization: Secondary | ICD-10-CM | POA: Diagnosis not present

## 2019-07-17 NOTE — Telephone Encounter (Signed)
Oral Chemotherapy Pharmacist Encounter   Spoke with patient's husband, Gershon Mussel,  today to follow up regarding patient's oral chemotherapy medication: Revlimid (lenalidomide)in conjunction with Ninlaro (ixazomib) and dexamethasonefor the treatment of multiple myeloma not having achieved remission, planned durationuntil adequate disease control or unacceptable toxicity  Original Start date of oral chemotherapy: Ninlaro: 03/11/2019, Revlimid: 04/08/2019 Patient started cycle 5 on 07/08/19 Cycle 6 will start on 08/05/19  Tom reports 0 doses of Revlimid '25mg'$  capsules, 1 capsule by mouth once daily, without regard to food, with a full glass of water, Ninlaro'4mg'$  capsules, 1 capsule by mouth once weekly, on an empty stomach, 1 hour before or 2 hours after a meal, taken on days 1, 8, and 15 of each 28 day cycle, and dexamethasone 4 mg tablets, 10 tablets (40 mg) by mouth once weekly, missed in the last month.   Tom reports the following side effects: none to report  Pertinent labs reviewed: OK for continued treatment.  Other Issues:  Gershon Mussel states that patient is going to be about 3 capsules short of her Revlimid this cycle.  He is unsure if they did not receive the right number of capsules from the pharmacy, or if patient accidentally took a few days of double doses of her Revlimid. Tom informed that it will be okay if she finishes this cycles Revlimid 3 days early as she still will have received the lion's share of all of her medicines for this cycle. They should order her next fill of Revlimid as normal (they order it during her week "off"), and start her next cycle as scheduled on 08/05/2019. I am unsure if they were shorted capsules from the pharmacy since her fills of Revlimid come in a sealed bottle from the manufacturer, or if patient took extra doses. Gershon Mussel denies any increase in side effects and specifically denies rash, nausea, or bruising.  All questions answered. Tom informed that I will ensure Dr.  Irene Limbo is notified that this cycle will in 3 capsules early and that I would reach back out to him with any additional directions that I may receive from Dr. Irene Limbo.  They know to call the office with questions or concerns.  Johny Drilling, PharmD, BCPS, BCOP  07/17/2019 8:36 AM Oral Oncology Clinic [phone]

## 2019-07-24 ENCOUNTER — Encounter: Payer: Self-pay | Admitting: Hematology

## 2019-07-24 ENCOUNTER — Other Ambulatory Visit: Payer: Self-pay | Admitting: Hematology

## 2019-07-24 DIAGNOSIS — C9000 Multiple myeloma not having achieved remission: Secondary | ICD-10-CM

## 2019-07-24 NOTE — Telephone Encounter (Signed)
[facility] reports both types of pneumonia vaccines are in stock.  Please advise and order for injection scheduling if.

## 2019-07-25 ENCOUNTER — Other Ambulatory Visit: Payer: Self-pay | Admitting: Hematology

## 2019-07-25 DIAGNOSIS — Z23 Encounter for immunization: Secondary | ICD-10-CM

## 2019-07-26 ENCOUNTER — Telehealth: Payer: Self-pay | Admitting: Hematology

## 2019-07-26 NOTE — Telephone Encounter (Signed)
Scheduled appt per 9/17 sch message - pt aware of appt added.

## 2019-07-31 ENCOUNTER — Inpatient Hospital Stay: Payer: Medicare Other

## 2019-07-31 ENCOUNTER — Encounter: Payer: Self-pay | Admitting: Orthopaedic Surgery

## 2019-07-31 ENCOUNTER — Ambulatory Visit (INDEPENDENT_AMBULATORY_CARE_PROVIDER_SITE_OTHER): Payer: Medicare Other | Admitting: Orthopaedic Surgery

## 2019-07-31 ENCOUNTER — Other Ambulatory Visit: Payer: Self-pay

## 2019-07-31 DIAGNOSIS — Z96651 Presence of right artificial knee joint: Secondary | ICD-10-CM

## 2019-07-31 DIAGNOSIS — Z23 Encounter for immunization: Secondary | ICD-10-CM

## 2019-07-31 DIAGNOSIS — D892 Hypergammaglobulinemia, unspecified: Secondary | ICD-10-CM | POA: Diagnosis not present

## 2019-07-31 DIAGNOSIS — C9000 Multiple myeloma not having achieved remission: Secondary | ICD-10-CM | POA: Diagnosis present

## 2019-07-31 DIAGNOSIS — Z7982 Long term (current) use of aspirin: Secondary | ICD-10-CM | POA: Diagnosis not present

## 2019-07-31 DIAGNOSIS — Z85828 Personal history of other malignant neoplasm of skin: Secondary | ICD-10-CM | POA: Diagnosis not present

## 2019-07-31 DIAGNOSIS — I4891 Unspecified atrial fibrillation: Secondary | ICD-10-CM | POA: Diagnosis not present

## 2019-07-31 DIAGNOSIS — Z79899 Other long term (current) drug therapy: Secondary | ICD-10-CM | POA: Diagnosis not present

## 2019-07-31 DIAGNOSIS — M1990 Unspecified osteoarthritis, unspecified site: Secondary | ICD-10-CM | POA: Diagnosis not present

## 2019-07-31 MED ORDER — PNEUMOCOCCAL VAC POLYVALENT 25 MCG/0.5ML IJ INJ
0.5000 mL | INJECTION | Freq: Once | INTRAMUSCULAR | Status: AC
  Administered 2019-07-31: 0.5 mL via INTRAMUSCULAR
  Filled 2019-07-31: qty 0.5

## 2019-07-31 NOTE — Progress Notes (Signed)
Office Visit Note   Patient: Jennifer [name]           Date of Birth: 1946-07-31           MRN: [id number] Visit Date: 07/31/2019              Requested by: [name], MD No address on file PCP: [name], MD   Assessment & Plan: Visit Diagnoses:  1. History of total right knee replacement     Plan: Approximately 11 months status post primary right total knee replacement doing well.  Playing golf several times a week and pickleball almost on a daily basis.  Occasionally has some pops and clicks but no significant pain.  I just encourage her to work on strengthening exercises for her quads and we will plan to see her back as needed.  Presently undergoing chemotherapy for smoldering multiple myeloma  Follow-Up Instructions: Return if symptoms worsen or fail to improve.   Orders:  No orders of the defined types were placed in this encounter.  No orders of the defined types were placed in this encounter.     Procedures: No procedures performed   Clinical Data: No additional findings.   Subjective: No chief complaint on file. Approximate 17-monthstatus post primary right total knee replacement and here for follow-up doing well.  Very active athletically without any significant problems.  Occasional pop or click.  Little bit of numbness along the lateral aspect of her knee but otherwise doing very well and not taking any medicines for her knee.  Receiving oral chemotherapy for multiple myeloma and considering stem cell ointment  HPI  Review of Systems   Objective: Vital Signs: LMP 11/07/1996   Physical Exam Constitutional:      Appearance: She is well-developed.  Eyes:     Pupils: Pupils are equal, round, and reactive to light.  Pulmonary:     Effort: Pulmonary effort is normal.  Skin:    General: Skin is warm and dry.  Neurological:     Mental Status: She is alert and oriented to person, place, and time.  Psychiatric:        Behavior: Behavior normal.      Ortho Exam right knee with near full quick extension.  No instability with varus and valgus stress and negative anterior drawer sign.  Minimal effusion.  Small area of decreased sensibility along the lateral aspect of her knee.  The old incision has healed without problem.  Straight leg raise negative  Specialty Comments:  No specialty comments available.  Imaging: No results found.   PMFS History: Patient Active Problem List   Diagnosis Date Noted  . Chronic left shoulder pain 11/14/2018  . History of total right knee replacement 10/17/2018  . Osteoarthritis of right knee 09/18/2018  . Primary osteoarthritis of both knees 11/30/2017  . Primary osteoarthritis of both feet 11/30/2017  . History of rotator cuff tear repair, bilateral 11/30/2017  . Primary osteoarthritis of right knee 08/23/2017  . Vitamin D deficiency 12/27/2015  . [place]22[place]  . Hypergammaglobulinemia   . IgG monoclonal gammopathy of uncertain significance   . Abnormal gamma globulin level 04/14/2015  . Hyperlipidemia 04/03/2015  . Hyperglycemia 04/03/2015  . Diverticulosis of colon without hemorrhage 04/03/2015  . Arthralgia of multiple joints 04/03/2015  . Primary osteoarthritis of both hands 01/20/2010  . SKIN CANCER, HX OF 01/20/2010   Past Medical History:  Diagnosis Date  . [place]rial fibrillation ([place] 12/2010   PMH  of ; [place] , [place] [facility]  . DJD (degenerative joint disease)   . History of blood transfusion 9678   complication after childbirth  . Osteoarthritis    "knees, hands, fingers, toes" (09/18/2018)  . PONV (postoperative nausea and vomiting) 09/2018   nausea only with knee surgery  . Skin cancer of face 2015   Patient reports this was likely basal cell on the right side of her face needing 14 stitches.  . Smoldering multiple myeloma ([place]) 2016   "pre bone cancer; being monitored for this q 4 months or so" (09/18/2018)  . Squamous cell carcinoma of scalp  2000   S/P MOHS    Family History  Problem Relation Age of Onset  . Atrial fibrillation Father   . Asthma Brother   . Alzheimer's disease Mother   . Myasthenia gravis Mother        ocular  . Breast cancer Paternal Grandmother   . Depression Daughter   . Colon cancer Neg Hx   . Rectal cancer Neg Hx   . Stomach cancer Neg Hx   . Diabetes Neg Hx   . Stroke Neg Hx   . Heart attack Neg Hx     Past Surgical History:  Procedure Laterality Date  . CATARACT EXTRACTION W/ INTRAOCULAR LENS  IMPLANT, BILATERAL Bilateral ~ 2015  . CHOLECYSTECTOMY OPEN  1990's  . COLONOSCOPY  02/2013   negative X 3; Dr Olevia Perches  . G 3 P 1    . HAMMER TOE SURGERY Left 2018   2nd digit  . JOINT REPLACEMENT    . MOHS SURGERY  ~ 2009   "back of my head"  . SHOULDER ARTHROSCOPY W/ ROTATOR CUFF REPAIR Right 2012    Dr Durward Fortes  . SHOULDER ARTHROSCOPY W/ ROTATOR CUFF REPAIR Left 06/2014   Dr. Sydnee Cabal for rotator cuff  . TONSILLECTOMY AND ADENOIDECTOMY    . TOTAL KNEE ARTHROPLASTY Right 09/18/2018  . TOTAL KNEE ARTHROPLASTY Right 09/18/2018   Procedure: RIGHT TOTAL KNEE ARTHROPLASTY;  Surgeon: Garald Balding, MD;  Location: [facility];  Service: Orthopedics;  Laterality: Right;  . WISDOM TOOTH EXTRACTION  1960s   Social History   Occupational History  . Not on file  Tobacco Use  . Smoking status: Former Smoker    Packs/day: 0.25    Years: 9.00    Pack years: 2.25    Quit date: 11/07/1970    Years since quitting: 48.7  . Smokeless tobacco: Never Used  . Tobacco comment: 1/4 ppd [id number]  Substance and Sexual Activity  . Alcohol use: Yes    Alcohol/week: 3.0 standard drinks    Types: 3 Glasses of wine per week  . Drug use: Never  . Sexual activity: Not Currently    Partners: Male     Garald Balding, MD   Note - This record has been created using [employer].  Chart creation errors have been sought, but may not always  have been located. Such creation errors do not reflect on  the  standard of medical care.

## 2019-07-31 NOTE — Patient Instructions (Signed)
Pneumococcal Conjugate Vaccine suspension for injection What is this medicine? PNEUMOCOCCAL VACCINE (NEU mo KOK al vak SEEN) is a vaccine used to prevent pneumococcus bacterial infections. These bacteria can cause serious infections like pneumonia, meningitis, and blood infections. This vaccine will lower your chance of getting pneumonia. If you do get pneumonia, it can make your symptoms milder and your illness shorter. This vaccine will not treat an infection and will not cause infection. This vaccine is recommended for infants and young children, adults with certain medical conditions, and adults 65 years or older. This medicine may be used for other purposes; ask your health care provider or pharmacist if you have questions. COMMON BRAND NAME(S): Prevnar, Prevnar 13 What should I tell my health care provider before I take this medicine? They need to know if you have any of these conditions:  bleeding problems  fever  immune system problems  an unusual or allergic reaction to pneumococcal vaccine, diphtheria toxoid, other vaccines, latex, other medicines, foods, dyes, or preservatives  pregnant or trying to get pregnant  breast-feeding How should I use this medicine? This vaccine is for injection into a muscle. It is given by a health care professional. A copy of Vaccine Information Statements will be given before each vaccination. Read this sheet carefully each time. The sheet may change frequently. Talk to your pediatrician regarding the use of this medicine in children. While this drug may be prescribed for children as young as 6 weeks old for selected conditions, precautions do apply. Overdosage: If you think you have taken too much of this medicine contact a poison control center or emergency room at once. NOTE: This medicine is only for you. Do not share this medicine with others. What if I miss a dose? It is important not to miss your dose. Call your doctor or health care  professional if you are unable to keep an appointment. What may interact with this medicine?  medicines for cancer chemotherapy  medicines that suppress your immune function  steroid medicines like prednisone or cortisone This list may not describe all possible interactions. Give your health care provider a list of all the medicines, herbs, non-prescription drugs, or dietary supplements you use. Also tell them if you smoke, drink alcohol, or use illegal drugs. Some items may interact with your medicine. What should I watch for while using this medicine? Mild fever and pain should go away in 3 days or less. Report any unusual symptoms to your doctor or health care professional. What side effects may I notice from receiving this medicine? Side effects that you should report to your doctor or health care professional as soon as possible:  allergic reactions like skin rash, itching or hives, swelling of the face, lips, or tongue  breathing problems  confused  fast or irregular heartbeat  fever over 102 degrees F  seizures  unusual bleeding or bruising  unusual muscle weakness Side effects that usually do not require medical attention (report to your doctor or health care professional if they continue or are bothersome):  aches and pains  diarrhea  fever of 102 degrees F or less  headache  irritable  loss of appetite  pain, tender at site where injected  trouble sleeping This list may not describe all possible side effects. Call your doctor for medical advice about side effects. You may report side effects to FDA at 1-800-FDA-1088. Where should I keep my medicine? This does not apply. This vaccine is given in a clinic, pharmacy, doctor's office,   or other health care setting and will not be stored at home. NOTE: This sheet is a summary. It may not cover all possible information. If you have questions about this medicine, talk to your doctor, pharmacist, or health care  provider.  2020 Elsevier/Gold Standard (2014-07-31 10:27:27)  

## 2019-08-02 DIAGNOSIS — C9010 Plasma cell leukemia not having achieved remission: Secondary | ICD-10-CM | POA: Diagnosis not present

## 2019-08-08 ENCOUNTER — Inpatient Hospital Stay: Payer: Medicare Other | Attending: Hematology | Admitting: Hematology

## 2019-08-08 ENCOUNTER — Encounter: Payer: Self-pay | Admitting: Hematology

## 2019-08-08 ENCOUNTER — Inpatient Hospital Stay: Payer: Medicare Other

## 2019-08-08 ENCOUNTER — Other Ambulatory Visit: Payer: Self-pay

## 2019-08-08 DIAGNOSIS — C9000 Multiple myeloma not having achieved remission: Secondary | ICD-10-CM | POA: Insufficient documentation

## 2019-08-08 DIAGNOSIS — Z7982 Long term (current) use of aspirin: Secondary | ICD-10-CM | POA: Diagnosis not present

## 2019-08-08 DIAGNOSIS — Z803 Family history of malignant neoplasm of breast: Secondary | ICD-10-CM | POA: Diagnosis not present

## 2019-08-08 DIAGNOSIS — Z79899 Other long term (current) drug therapy: Secondary | ICD-10-CM | POA: Diagnosis not present

## 2019-08-08 DIAGNOSIS — Z87891 Personal history of nicotine dependence: Secondary | ICD-10-CM | POA: Insufficient documentation

## 2019-08-08 DIAGNOSIS — I4891 Unspecified atrial fibrillation: Secondary | ICD-10-CM | POA: Insufficient documentation

## 2019-08-08 LAB — CMP (CANCER CENTER ONLY)
ALT: 26 U/L (ref 0–44)
AST: 21 U/L (ref 15–41)
Albumin: 4 g/dL (ref 3.5–5.0)
Alkaline Phosphatase: 60 U/L (ref 38–126)
Anion gap: 9 (ref 5–15)
BUN: 19 mg/dL (ref 8–23)
CO2: 25 mmol/L (ref 22–32)
Calcium: 9.1 mg/dL (ref 8.9–10.3)
Chloride: 104 mmol/L (ref 98–111)
Creatinine: 0.92 mg/dL (ref 0.44–1.00)
GFR, Est AFR Am: >60 mL/min (ref >60)
GFR, Estimated: >60 mL/min (ref >60)
Glucose, Bld: 132 mg/dL — ABNORMAL HIGH (ref 70–99)
Potassium: 4.1 mmol/L (ref 3.5–5.1)
Sodium: 138 mmol/L (ref 135–145)
Total Bilirubin: 0.8 mg/dL (ref 0.3–1.2)
Total Protein: 7.7 g/dL (ref 6.5–8.1)

## 2019-08-08 LAB — CBC WITH DIFFERENTIAL/PLATELET
Abs Immature Granulocytes: 0.03 10*3/uL (ref 0.00–0.07)
Basophils Absolute: 0.1 10*3/uL (ref 0.0–0.1)
Basophils Relative: 1 %
Eosinophils Absolute: 0.1 10*3/uL (ref 0.0–0.5)
Eosinophils Relative: 2 %
HCT: 41.4 % (ref 36.0–46.0)
Hemoglobin: 13.9 g/dL (ref 12.0–15.0)
Immature Granulocytes: 1 %
Lymphocytes Relative: 32 %
Lymphs Abs: 1.7 10*3/uL (ref 0.7–4.0)
MCH: 32.8 pg (ref 26.0–34.0)
MCHC: 33.6 g/dL (ref 30.0–36.0)
MCV: 97.6 fL (ref 80.0–100.0)
Monocytes Absolute: 0.5 10*3/uL (ref 0.1–1.0)
Monocytes Relative: 9 %
Neutro Abs: 2.9 10*3/uL (ref 1.7–7.7)
Neutrophils Relative %: 55 %
Platelets: 247 10*3/uL (ref 150–400)
RBC: 4.24 MIL/uL (ref 3.87–5.11)
RDW: 14.2 % (ref 11.5–15.5)
WBC: 5.2 10*3/uL (ref 4.0–10.5)
nRBC: 0 % (ref 0.0–0.2)

## 2019-08-08 NOTE — Progress Notes (Signed)
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  HISTORY OF PRESENTING ILLNESS: please see my initial consultation for details of her initial presentation  INTERVAL HISTORY I connected with Seward Carol on 08/08/19 at  9:20 AM EDT by telephone visit and verified that I am speaking with the correct person using two identifiers.   I discussed the limitations, risks, security and privacy concerns of performing an evaluation and management service by telemedicine and the availability of in-person appointments. I also discussed with the patient that there may be a patient responsible charge related to this service. The patient expressed understanding and agreed to proceed.   Other persons participating in the visit and their role in the encounter:   Jacqualyn Posey, [place], Medical Scribe   Patient's location: Home  Provider's location: Clinic   Chief Complaint: Multiple Myeloma   Ms Ryland returns today for management and evaluation of her Multiple Myeloma. The patient's last visit with [name] was on 07/10/2019. The pt is accompanied by her husband, Bo Rogue. The pt reports that she is doing well overall.  The pt reports that she is doing well and is still remaining active.   Lab results today (08/08/19) of CBC w/diff and CMP is as follows: all values are WNL except for Glucose at 132. PENDING  MMP and K/L light Chains   On review of systems, pt reports feet are sensitive in the heel area and denies tingling, leg swelling, nausea,vomiting, and any other symptoms.    MEDICAL HISTORY:  Past Medical History:  Diagnosis Date  . Atrial fibrillation  ([place]) 12/2010   PMH of ; [place] , [place] [facility]  . DJD (degenerative joint disease)   . History of blood transfusion 5277   complication after childbirth  . Osteoarthritis    "knees, hands, fingers, toes" (09/18/2018)  . PONV (postoperative nausea and vomiting) 09/2018   nausea only with knee surgery  . Skin cancer of face 2015   Patient reports this was likely basal cell on the right side of her face needing 14 stitches.  . Smoldering multiple myeloma ([place]) 2016   "pre bone cancer; being monitored for this q 4 months or so" (09/18/2018)  . Squamous cell carcinoma of scalp 2000   S/P MOHS   . Patient Active Problem List   Diagnosis Date Noted  . Chronic left shoulder pain 11/14/2018  . History of total right knee replacement 10/17/2018  . Osteoarthritis of right knee 09/18/2018  . Primary osteoarthritis of both knees 11/30/2017  . Primary osteoarthritis of both feet 11/30/2017  . History of rotator cuff tear repair, bilateral 11/30/2017  . Primary osteoarthritis of right knee 08/23/2017  . Vitamin D deficiency 12/27/2015  . Smoldering multiple myeloma ([place]) 12/22/2015  . Hypergammaglobulinemia   . IgG monoclonal gammopathy of uncertain significance   . Abnormal gamma globulin level 04/14/2015  . Hyperlipidemia 04/03/2015  . Hyperglycemia 04/03/2015  . Diverticulosis of colon without hemorrhage 04/03/2015  . Arthralgia of multiple joints 04/03/2015  . Primary osteoarthritis of both hands 01/20/2010  . SKIN CANCER, HX OF 01/20/2010    SURGICAL HISTORY: Past Surgical History:  Procedure Laterality Date  . CATARACT  EXTRACTION W/ INTRAOCULAR LENS  IMPLANT, BILATERAL Bilateral ~ 2015  . CHOLECYSTECTOMY OPEN  1990's  . COLONOSCOPY  02/2013   negative X 3; Dr Olevia Perches  . G 3 P 1    . HAMMER TOE SURGERY Left 2018   2nd digit  . JOINT REPLACEMENT    . MOHS SURGERY  ~ 2009   "back of my head"  . SHOULDER ARTHROSCOPY W/ ROTATOR CUFF REPAIR Right 2012    Dr Durward Fortes  . SHOULDER  ARTHROSCOPY W/ ROTATOR CUFF REPAIR Left 06/2014   Dr. Sydnee Cabal for rotator cuff  . TONSILLECTOMY AND ADENOIDECTOMY    . TOTAL KNEE ARTHROPLASTY Right 09/18/2018  . TOTAL KNEE ARTHROPLASTY Right 09/18/2018   Procedure: RIGHT TOTAL KNEE ARTHROPLASTY;  Surgeon: Garald Balding, MD;  Location: [facility];  Service: Orthopedics;  Laterality: Right;  . WISDOM TOOTH EXTRACTION  1960s    SOCIAL HISTORY: Social History   Socioeconomic History  . Marital status: Married    Spouse name: Not on file  . Number of children: Not on file  . Years of education: Not on file  . Highest education level: Not on file  Occupational History  . Not on file  Social Needs  . Financial resource strain: Not on file  . Food insecurity    Worry: Not on file    Inability: Not on file  . Transportation needs    Medical: Not on file    Non-medical: Not on file  Tobacco Use  . Smoking status: Former Smoker    Packs/day: 0.25    Years: 9.00    Pack years: 2.25    Quit date: 11/07/1970    Years since quitting: 48.7  . Smokeless tobacco: Never Used  . Tobacco comment: 1/4 ppd [id number]  Substance and Sexual Activity  . Alcohol use: Yes    Alcohol/week: 3.0 standard drinks    Types: 3 Glasses of wine per week  . Drug use: Never  . Sexual activity: Not Currently    Partners: Male  Lifestyle  . Physical activity    Days per week: Not on file    Minutes per session: Not on file  . Stress: Not on file  Relationships  . Social Herbalist on phone: Not on file    Gets together: Not on file    Attends religious service: Not on file    Active member of club or organization: Not on file    Attends meetings of clubs or organizations: Not on file    Relationship status: Not on file  . Intimate partner violence    Fear of current or ex partner: Not on file    Emotionally abused: Not on file    Physically abused: Not on file    Forced sexual activity: Not on file  Other Topics Concern  . Not on file   Social History Narrative  . Not on file    FAMILY HISTORY: Family History  Problem Relation Age of Onset  . Atrial fibrillation Father   . Asthma Brother   . Alzheimer's disease Mother   . Myasthenia gravis Mother        ocular  . Breast cancer Paternal Grandmother   . Depression Daughter   . Colon cancer Neg Hx   . Rectal cancer Neg Hx   . Stomach cancer Neg Hx   . Diabetes Neg Hx   . Stroke Neg Hx   . Heart attack Neg Hx  ALLERGIES:  has No Known Allergies.  MEDICATIONS:  Current Outpatient Medications  Medication Sig Dispense Refill  . acyclovir (ZOVIRAX) 400 MG tablet Take 1 tablet (400 mg total) by mouth 2 (two) times daily. 60 tablet 6  . aspirin EC 81 MG tablet Take 81 mg by mouth daily.    Marland Kitchen dexamethasone (DECADRON) 4 MG tablet Take 10 tablets (40 mg total) by mouth once a week. With food. 40 tablet 3  . lenalidomide (REVLIMID) 25 MG capsule Take 1 capsule (25 mg total) by mouth daily. Start on Day 1 of cycle and take for 21 days then 7 days off 21 capsule 0  . multivitamin-iron-minerals-folic acid (CENTRUM) chewable tablet Chew 1 tablet by mouth daily.    Marland Kitchen NINLARO 4 MG capsule TAKE 1 CAPSULE BY MOUTH ON DAYS 1, 8, 15 OF A 28 DAY CYCLE. TAKE ON ANEMPTY STOMACH AT LEAST 1HR BEFORE OR 2HR AFTER EATING. SWALLOW WHOLE.  STORE IN ORIGINAL PKG 3 capsule 2  . ondansetron (ZOFRAN) 8 MG tablet Take 1 tablet (8 mg total) by mouth every 8 (eight) hours as needed for nausea or vomiting. Take 1 tab 30-52mns prior to Ninlaro 30 tablet 3   No current facility-administered medications for this visit.     REVIEW OF SYSTEMS:   A 10+ POINT REVIEW OF SYSTEMS WAS OBTAINED including neurology, dermatology, psychiatry, cardiac, respiratory, lymph, extremities, GI, GU, Musculoskeletal, constitutional, breasts, reproductive, HEENT.  All pertinent positives are noted in the HPI.  All others are negative.     PHYSICAL EXAMINATION: There were no vitals filed for this visit. Wt Readings  from Last 3 Encounters:  05/21/19 145 lb 6.4 oz (66 kg)  05/06/19 147 lb 3.2 oz (66.8 kg)  04/22/19 149 lb 12.8 oz (67.9 kg)   There is no height or weight on file to calculate BMI.    ECOG FS:0 - Asymptomatic  Telehealth Visit 08/08/19   LABORATORY DATA:  I have reviewed the data as listed  . CBC Latest Ref Rng & Units 06/27/2019 06/04/2019 05/21/2019  WBC 4.0 - 10.5 K/uL 5.5 10.7(H) 12.8(H)  Hemoglobin 12.0 - 15.0 g/dL 12.2 12.3 12.4  Hematocrit 36.0 - 46.0 % 36.4 36.0 37.1  Platelets 150 - 400 K/uL 182 304 227   . CBC    Component Value Date/Time   WBC 5.5 06/27/2019 1207   RBC 3.69 (L) 06/27/2019 1207   HGB 12.2 06/27/2019 1207   HGB 11.3 (L) 03/22/2019 0940   HGB 12.4 10/26/2017 1002   HCT 36.4 06/27/2019 1207   HCT 36.2 10/26/2017 1002   PLT 182 06/27/2019 1207   PLT 230 03/22/2019 0940   PLT 235 10/26/2017 1002   MCV 98.6 06/27/2019 1207   MCV 96.0 10/26/2017 1002   MCH 33.1 06/27/2019 1207   MCHC 33.5 06/27/2019 1207   RDW 12.9 06/27/2019 1207   RDW 12.7 10/26/2017 1002   LYMPHSABS 1.8 06/27/2019 1207   LYMPHSABS 2.4 10/26/2017 1002   MONOABS 0.5 06/27/2019 1207   MONOABS 0.2 10/26/2017 1002   EOSABS 0.2 06/27/2019 1207   EOSABS 0.1 10/26/2017 1002   BASOSABS 0.1 06/27/2019 1207   BASOSABS 0.0 10/26/2017 1002    . CMP Latest Ref Rng & Units 06/27/2019 06/04/2019 05/21/2019  Glucose 70 - 99 mg/dL 126(H) 108(H) 104(H)  BUN 8 - 23 mg/dL '18 18 16  '$ Creatinine 0.44 - 1.00 mg/dL 0.92 0.85 0.88  Sodium 135 - 145 mmol/L 139 139 140  Potassium 3.5 - 5.1 mmol/L 4.0 3.7  3.5  Chloride 98 - 111 mmol/L 106 106 106  CO2 22 - 32 mmol/L '22 23 22  '$ Calcium 8.9 - 10.3 mg/dL 8.9 9.5 9.2  Total Protein 6.5 - 8.1 g/dL 7.1 7.9 8.2(H)  Total Bilirubin 0.3 - 1.2 mg/dL 0.6 0.4 0.4  Alkaline Phos 38 - 126 U/L 55 73 60  AST 15 - 41 U/L 24 14(L) 18  ALT 0 - 44 U/L 42 26 33     03/01/18 Cytogenetics:   03/01/18 Bx:     11/27/18 BM Bx:     11/27/18 Cytogenetics:       RADIOGRAPHIC STUDIES: I have personally reviewed the radiological images as listed and agreed with the findings in the report. No results found. Normal DEXA scan in June 2015  Cytogenetic analysis: Revealed the presence of a normal female chromosomes with no observable clonal chromosomal abnormalities.   ASSESSMENT & PLAN:   73 y.o. Caucasian female in good overall health with  #1Smoldering Multiple Myeloma IgG lambda. Bone marrow plasma cells more than 10% ( noted to have 35% clonal plasma cells] Normal female karyotype cytogenetics with no other abnormalities SPEP shows M spike of 2.2 (slightly decrease/stabiliyt from previous levels) with IFE showing IgG lambda paraprotein. Associated with some Immunoparesis characterized by associated decrease in IgA and IgM.  M protein 2.6--> 2.6---> 2.2--->2.4--->2.3--> 2.6-->2.4->2.5--> 2.2--->3 -->2.7-->3.9-->4.1-->2.2-->1.4--> 1.1  03/02/18 PET/CT revealed No hypermetabolic osseous lesion on today's study. No hypermetabolic soft tissue disease in the neck, chest, abdomen, pelvis, or lower extremities   03/05/18 cytogenetics report revealed that her plasma cells make up 35% of her cells.    11/27/18 BM Bx revealed slightly hypercellular bone marrow with plasma cell neoplasm and plasma cells comprising about 46% of all cells  11/27/18 PET/CT revealed No focal activity within the axillary appendicular skeleton to suggest active multiple myeloma. 2. No lytic skeletal lesions within the CT portion.   Last genetics revealed translocation 11;14, consistent with standard risk.    PLAN: -Discussed pt labwork today, 08/08/19;  all values are WNL except for Glucose at 132. PENDING  MMP and K/L light Chains -The pt has no prohibitive toxicities from continuing C5 of Ninlaro, Dexamethasone and Revlimid at this time. -Plan for up to 6 cycles of Revlimid, Dexamethasone and Ninlaro, maybe more if the pt continues to respond to and tolerate treatment.   -Discussed the option to have second opinion with Dr. Norma Fredrickson and he can perform a referral for third opinion. Also discussed the difference between inpatient at [facility] and outpatient at [facility] for transplants.  -Advised that most transplant centers require that the pt stay 20-40 mins from transplant center. -Discussed with the pt the option to come in today, 08/08/2019, for to complete labs or to come in at any time before appt with Dr. Norma Fredrickson. Pt advised that she will come in today before noon. - Will follow up after appt with Dr.Rodriguez.  FOLLOW UP: Labs today Labs in 3 weeks Phone visit with Dr Irene Limbo in 4 weeks  The total time spent in the appt was 20 minutes and more than 50% was on counseling and direct patient cares.  All of the patient's questions were answered with apparent satisfaction. The patient knows to call the clinic with any problems, questions or concerns.    Sullivan Lone MD [place] AAHIVMS [facility] H[facility]ology/Oncology Physician [place]  (Office):       [phone] (Work cell):  [phone] (Fax):  [phone]  I, Jacqualyn Posey, am acting as a Education administrator for Dr. Sullivan Lone.   .I have reviewed the above documentation for accuracy and completeness, and I agree with the above. Brunetta Genera MD

## 2019-08-09 ENCOUNTER — Telehealth: Payer: Self-pay | Admitting: Hematology

## 2019-08-09 LAB — KAPPA/LAMBDA LIGHT CHAINS
Kappa free light chain: 8.3 mg/L (ref 3.3–19.4)
Kappa, lambda light chain ratio: 0.76 (ref 0.26–1.65)
Lambda free light chains: 10.9 mg/L (ref 5.7–26.3)

## 2019-08-09 NOTE — Telephone Encounter (Signed)
Scheduled appt per 10/1 los. ° °Spoke with patient and she is aware of her appt date and time. °

## 2019-08-11 LAB — MULTIPLE MYELOMA PANEL, SERUM
Albumin SerPl Elph-Mcnc: 3.7 g/dL (ref 2.9–4.4)
Albumin/Glob SerPl: 1.1 (ref 0.7–1.7)
Alpha 1: 0.2 g/dL (ref 0.0–0.4)
Alpha2 Glob SerPl Elph-Mcnc: 0.7 g/dL (ref 0.4–1.0)
B-Globulin SerPl Elph-Mcnc: 0.9 g/dL (ref 0.7–1.3)
Gamma Glob SerPl Elph-Mcnc: 1.6 g/dL (ref 0.4–1.8)
Globulin, Total: 3.4 g/dL (ref 2.2–3.9)
IgA: 16 mg/dL — ABNORMAL LOW (ref 64–422)
IgG (Immunoglobin G), Serum: 1891 mg/dL — ABNORMAL HIGH (ref 586–1602)
IgM (Immunoglobulin M), Srm: <5 mg/dL — ABNORMAL LOW (ref 26–217)
M Protein SerPl Elph-Mcnc: 1.3 g/dL — ABNORMAL HIGH
Total Protein ELP: 7.1 g/dL (ref 6.0–8.5)

## 2019-08-12 DIAGNOSIS — D892 Hypergammaglobulinemia, unspecified: Secondary | ICD-10-CM | POA: Diagnosis not present

## 2019-08-12 DIAGNOSIS — C9000 Multiple myeloma not having achieved remission: Secondary | ICD-10-CM | POA: Diagnosis not present

## 2019-08-12 DIAGNOSIS — Z79899 Other long term (current) drug therapy: Secondary | ICD-10-CM | POA: Diagnosis not present

## 2019-08-12 DIAGNOSIS — Z7189 Other specified counseling: Secondary | ICD-10-CM | POA: Diagnosis not present

## 2019-08-13 ENCOUNTER — Encounter: Payer: Self-pay | Admitting: Hematology

## 2019-08-15 ENCOUNTER — Telehealth: Payer: Self-pay | Admitting: Hematology

## 2019-08-15 ENCOUNTER — Other Ambulatory Visit: Payer: Self-pay | Admitting: Hematology

## 2019-08-15 ENCOUNTER — Other Ambulatory Visit: Payer: Self-pay | Admitting: *Deleted

## 2019-08-15 DIAGNOSIS — C9000 Multiple myeloma not having achieved remission: Secondary | ICD-10-CM

## 2019-08-15 MED ORDER — LENALIDOMIDE 25 MG PO CAPS: 25.0000 mg | ORAL_CAPSULE | Freq: Every day | ORAL | 0 refills | Status: DC

## 2019-08-15 NOTE — Telephone Encounter (Signed)
[employer] Auth # ?[id number], 08/15/2019 Refilled per Dr.Kale OV note 08/08/2019 - escribed to  [place], [place] - [address] 08/15/2019, [employer] Auth# [id number]

## 2019-08-15 NOTE — Telephone Encounter (Signed)
Scheduled appt per 10/8 sch message - pt is aware of appt date and time

## 2019-08-15 NOTE — Telephone Encounter (Signed)
Revlimid refilled by escribe to [facility] [place] [place] - [address] as per Dr.Kale OV note 08/08/2019.

## 2019-08-16 DIAGNOSIS — C9000 Multiple myeloma not having achieved remission: Secondary | ICD-10-CM | POA: Diagnosis not present

## 2019-08-16 NOTE — Progress Notes (Signed)
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  HISTORY OF PRESENTING ILLNESS: please see my initial consultation for details of her initial presentation  INTERVAL HISTORY I connected with  Jennifer [name] on 08/21/19 by telephone and verified that I am speaking with the correct person using two identifiers.   I discussed the limitations of evaluation and management by telemedicine. The patient expressed understanding and agreed to proceed.  Other persons participating in the visit and their role in the encounter:     -Yevette Edwards, Medical Scribe  Patient's location: Home  Provider's location: [facility] [place] returns today for management and evaluation of her Multiple Myeloma. Pt is on currently finishing C6 Ninlaro, Dexamethasone and Revlimid. The patient's last visit with [name] was on 08/08/2019. The pt reports that she is doing well overall.  The pt reports that she feels very good and hasn't experienced any side effects from her current treament. She had a meeting with Dr. Norma Fredrickson in the interim. Pt had an appointment on Friday at [facility]. Dr. Joylene Igo is the Medical Oncologist at [facility]. She is scheduled for a port placement on 09/04/2019.   Lab results (08/08/19) of CBC w/diff and CMP is as follows: all values are WNL except for Glucose at 132.  08/08/2019 K/L light chains is as follows: Kappa free light chain at 8.3, Lamda free light chains at 10.9, K/L light chain ratio at 0.76 08/08/2019 MMP is as follows: all values are WNL except for IgG at 1891, IgA at 16, IgM at <5, M protein  at 1.3, IFE shows "Immunofixation shows IgG monoclonal protein with lambda light chain specificity."  On review of systems, pt denies any other symptoms.   MEDICAL HISTORY:  Past Medical History:  Diagnosis Date  . Atrial fibrillation ([place]) 12/2010   PMH of ; [place] , [place] [facility]  . DJD (degenerative joint disease)   . History of blood transfusion 2505   complication after childbirth  . Osteoarthritis    "knees, hands, fingers, toes" (09/18/2018)  . PONV (postoperative nausea and vomiting) 09/2018   nausea only with knee surgery  . Skin cancer of face 2015   Patient reports this was likely basal cell on the right side of her face needing 14 stitches.  . Smoldering multiple myeloma ([place]) 2016   "pre bone cancer; being monitored for this q 4 months or so" (09/18/2018)  . Squamous cell carcinoma of scalp 2000   S/P MOHS   . Patient Active Problem List   Diagnosis Date Noted  . Chronic left shoulder pain 11/14/2018  . History of total right knee replacement 10/17/2018  . Osteoarthritis of right knee 09/18/2018  . Primary osteoarthritis of both knees 11/30/2017  . Primary osteoarthritis of both feet 11/30/2017  . History of rotator cuff tear repair, bilateral 11/30/2017  . Primary osteoarthritis of right knee 08/23/2017  . Vitamin D deficiency 12/27/2015  . Smoldering multiple myeloma ([place]) 12/22/2015  . Hypergammaglobulinemia   . IgG monoclonal gammopathy of uncertain significance   . Abnormal gamma globulin level 04/14/2015  . Hyperlipidemia 04/03/2015  . Hyperglycemia 04/03/2015  .  Diverticulosis of colon without hemorrhage 04/03/2015  . Arthralgia of multiple joints 04/03/2015  . Primary osteoarthritis of both hands 01/20/2010  . SKIN CANCER, HX OF 01/20/2010    SURGICAL HISTORY: Past Surgical History:  Procedure Laterality Date  . CATARACT EXTRACTION W/ INTRAOCULAR LENS  IMPLANT, BILATERAL Bilateral ~ 2015  . CHOLECYSTECTOMY OPEN  1990's  . COLONOSCOPY  02/2013    negative X 3; Dr Olevia Perches  . G 3 P 1    . HAMMER TOE SURGERY Left 2018   2nd digit  . JOINT REPLACEMENT    . MOHS SURGERY  ~ 2009   "back of my head"  . SHOULDER ARTHROSCOPY W/ ROTATOR CUFF REPAIR Right 2012    Dr Durward Fortes  . SHOULDER ARTHROSCOPY W/ ROTATOR CUFF REPAIR Left 06/2014   Dr. Sydnee Cabal for rotator cuff  . TONSILLECTOMY AND ADENOIDECTOMY    . TOTAL KNEE ARTHROPLASTY Right 09/18/2018  . TOTAL KNEE ARTHROPLASTY Right 09/18/2018   Procedure: RIGHT TOTAL KNEE ARTHROPLASTY;  Surgeon: Garald Balding, MD;  Location: [place];  Service: Orthopedics;  Laterality: Right;  . WISDOM TOOTH EXTRACTION  1960s    SOCIAL HISTORY: Social History   Socioeconomic History  . Marital status: Married    Spouse name: Not on file  . Number of children: Not on file  . Years of education: Not on file  . Highest education level: Not on file  Occupational History  . Not on file  Social Needs  . Financial resource strain: Not on file  . Food insecurity    Worry: Not on file    Inability: Not on file  . Transportation needs    Medical: Not on file    Non-medical: Not on file  Tobacco Use  . Smoking status: Former Smoker    Packs/day: 0.25    Years: 9.00    Pack years: 2.25    Quit date: 11/07/1970    Years since quitting: 48.8  . Smokeless tobacco: Never Used  . Tobacco comment: 1/4 ppd [id number]  Substance and Sexual Activity  . Alcohol use: Yes    Alcohol/week: 3.0 standard drinks    Types: 3 Glasses of wine per week  . Drug use: Never  . Sexual activity: Not Currently    Partners: Male  Lifestyle  . Physical activity    Days per week: Not on file    Minutes per session: Not on file  . Stress: Not on file  Relationships  . Social Herbalist on phone: Not on file    Gets together: Not on file    Attends religious service: Not on file    Active member of club or organization: Not on file    Attends meetings of clubs or organizations: Not on file    Relationship  status: Not on file  . Intimate partner violence    Fear of current or ex partner: Not on file    Emotionally abused: Not on file    Physically abused: Not on file    Forced sexual activity: Not on file  Other Topics Concern  . Not on file  Social History Narrative  . Not on file    FAMILY HISTORY: Family History  Problem Relation Age of Onset  . Atrial fibrillation Father   . Asthma Brother   . Alzheimer's disease Mother   . Myasthenia gravis Mother        ocular  . Breast cancer Paternal Grandmother   . Depression Daughter   .  Colon cancer Neg Hx   . Rectal cancer Neg Hx   . Stomach cancer Neg Hx   . Diabetes Neg Hx   . Stroke Neg Hx   . Heart attack Neg Hx     ALLERGIES:  has No Known Allergies.  MEDICATIONS:  Current Outpatient Medications  Medication Sig Dispense Refill  . acyclovir (ZOVIRAX) 400 MG tablet Take 1 tablet (400 mg total) by mouth 2 (two) times daily. 60 tablet 6  . aspirin EC 81 MG tablet Take 81 mg by mouth daily.    Marland Kitchen dexamethasone (DECADRON) 4 MG tablet Take 10 tablets (40 mg total) by mouth once a week. With food. 40 tablet 3  . lenalidomide (REVLIMID) 25 MG capsule Take 1 capsule (25 mg total) by mouth daily. Start on Day 1 of cycle and take for 21 days then 7 days off 21 capsule 0  . multivitamin-iron-minerals-folic acid (CENTRUM) chewable tablet Chew 1 tablet by mouth daily.    Marland Kitchen NINLARO 4 MG capsule TAKE 1 CAPSULE BY MOUTH ON DAYS 1, 8, 15 OF A 28 DAY CYCLE. TAKE ON ANEMPTY STOMACH AT LEAST 1HR BEFORE OR 2HR AFTER EATING. SWALLOW WHOLE.  STORE IN ORIGINAL PKG 3 capsule 2  . ondansetron (ZOFRAN) 8 MG tablet Take 1 tablet (8 mg total) by mouth every 8 (eight) hours as needed for nausea or vomiting. Take 1 tab 30-108mns prior to Ninlaro 30 tablet 3   No current facility-administered medications for this visit.     REVIEW OF SYSTEMS:    A 10+ POINT REVIEW OF SYSTEMS WAS OBTAINED including neurology, dermatology, psychiatry, cardiac, respiratory,  lymph, extremities, GI, GU, Musculoskeletal, constitutional, breasts, reproductive, HEENT.  All pertinent positives are noted in the HPI.  All others are negative.     PHYSICAL EXAMINATION: There were no vitals filed for this visit. Wt Readings from Last 3 Encounters:  05/21/19 145 lb 6.4 oz (66 kg)  05/06/19 147 lb 3.2 oz (66.8 kg)  04/22/19 149 lb 12.8 oz (67.9 kg)   There is no height or weight on file to calculate BMI.    ECOG FS:0 - Asymptomatic  Phone visit   LABORATORY DATA:  I have reviewed the data as listed  . CBC Latest Ref Rng & Units 08/08/2019 06/27/2019 06/04/2019  WBC 4.0 - 10.5 K/uL 5.2 5.5 10.7(H)  Hemoglobin 12.0 - 15.0 g/dL 13.9 12.2 12.3  Hematocrit 36.0 - 46.0 % 41.4 36.4 36.0  Platelets 150 - 400 K/uL 247 182 304   . CBC    Component Value Date/Time   WBC 5.2 08/08/2019 1226   RBC 4.24 08/08/2019 1226   HGB 13.9 08/08/2019 1226   HGB 11.3 (L) 03/22/2019 0940   HGB 12.4 10/26/2017 1002   HCT 41.4 08/08/2019 1226   HCT 36.2 10/26/2017 1002   PLT 247 08/08/2019 1226   PLT 230 03/22/2019 0940   PLT 235 10/26/2017 1002   MCV 97.6 08/08/2019 1226   MCV 96.0 10/26/2017 1002   MCH 32.8 08/08/2019 1226   MCHC 33.6 08/08/2019 1226   RDW 14.2 08/08/2019 1226   RDW 12.7 10/26/2017 1002   LYMPHSABS 1.7 08/08/2019 1226   LYMPHSABS 2.4 10/26/2017 1002   MONOABS 0.5 08/08/2019 1226   MONOABS 0.2 10/26/2017 1002   EOSABS 0.1 08/08/2019 1226   EOSABS 0.1 10/26/2017 1002   BASOSABS 0.1 08/08/2019 1226   BASOSABS 0.0 10/26/2017 1002    . CMP Latest Ref Rng & Units 08/08/2019 06/27/2019 06/04/2019  Glucose 70 -  99 mg/dL 132(H) 126(H) 108(H)  BUN 8 - 23 mg/dL '19 18 18  '$ Creatinine 0.44 - 1.00 mg/dL 0.92 0.92 0.85  Sodium 135 - 145 mmol/L 138 139 139  Potassium 3.5 - 5.1 mmol/L 4.1 4.0 3.7  Chloride 98 - 111 mmol/L 104 106 106  CO2 22 - 32 mmol/L '25 22 23  '$ Calcium 8.9 - 10.3 mg/dL 9.1 8.9 9.5  Total Protein 6.5 - 8.1 g/dL 7.7 7.1 7.9  Total Bilirubin 0.3 -  1.2 mg/dL 0.8 0.6 0.4  Alkaline Phos 38 - 126 U/L 60 55 73  AST 15 - 41 U/L 21 24 14(L)  ALT 0 - 44 U/L 26 42 26     03/01/18 Cytogenetics:   03/01/18 Bx:     11/27/18 BM Bx:     11/27/18 Cytogenetics:      RADIOGRAPHIC STUDIES: I have personally reviewed the radiological images as listed and agreed with the findings in the report. No results found. Normal DEXA scan in June 2015  Cytogenetic analysis: Revealed the presence of a normal female chromosomes with no observable clonal chromosomal abnormalities.   ASSESSMENT & PLAN:   73 y.o. Caucasian female in good overall health with  #1Smoldering Multiple Myeloma IgG lambda. Bone marrow plasma cells more than 10% ( noted to have 35% clonal plasma cells] Normal female karyotype cytogenetics with no other abnormalities SPEP shows M spike of 2.2 (slightly decrease/stabiliyt from previous levels) with IFE showing IgG lambda paraprotein. Associated with some Immunoparesis characterized by associated decrease in IgA and IgM.  M protein 2.6--> 2.6---> 2.2--->2.4--->2.3--> 2.6-->2.4->2.5--> 2.2--->3 -->2.7-->3.9-->4.1-->2.2-->1.4--> 1.1  03/02/18 PET/CT revealed No hypermetabolic osseous lesion on today's study. No hypermetabolic soft tissue disease in the neck, chest, abdomen, pelvis, or lower extremities   03/05/18 cytogenetics report revealed that her plasma cells make up 35% of her cells.    11/27/18 BM Bx revealed slightly hypercellular bone marrow with plasma cell neoplasm and plasma cells comprising about 46% of all cells  11/27/18 PET/CT revealed No focal activity within the axillary appendicular skeleton to suggest active multiple myeloma. 2. No lytic skeletal lesions within the CT portion.   Last genetics revealed translocation 11;14, consistent with standard risk.    PLAN: -Discussed10/11/2018 K/L light chains is as follows: Kappa free light chain at 8.3, Lamda free light chains at 10.9, K/L light chain ratio at 0.76  -Discussed 08/08/2019 MMP is as follows: all values are WNL except for IgG at 1891, IgA at 16, IgM at <5, M protein at 1.3, IFE shows "Immunofixation shows IgG monoclonal protein with lambda light chain specificity." -The pt has no prohibitive toxicities from continuing C6 of Ninlaro, Dexamethasone and Revlimid at this time. -Pt is experiencing a plateau in response or a progression of a disease based on M Protein -Pt had a partial response (75%) to 5 cycles of Ninlaro, Dexamethasone and Revlimid  -Advised pt that we would typically want atleast 90% response (VGPR) before transplant -Advised that Dr. Norma Fredrickson recommended to begin a treatment regimen of Daratumumab, [place] for 2-3 cycles -Discussed transferring care to a Medical Oncologist at [place] see back as needed  FOLLOW UP: Please cancel all upcoming cancer center appointments. Will be transferring cares closer to home.  The total time spent in the appt was 25 minutes and more than 50% was on counseling and direct patient cares.  All of the patient's questions were answered with apparent satisfaction. The patient knows to call the clinic with any problems,  questions or concerns.    Sullivan Lone MD [place] AAHIVMS [facility] Hematology/Oncology Physician [place]  (Office):       [phone] (Work cell):  [phone] (Fax):           [phone]  I, Yevette Edwards, am acting as a scribe for Dr. Sullivan Lone.   .I have reviewed the above documentation for accuracy and completeness, and I agree with the above. Brunetta Genera MD

## 2019-08-19 ENCOUNTER — Encounter: Payer: Self-pay | Admitting: Hematology

## 2019-08-21 ENCOUNTER — Inpatient Hospital Stay (HOSPITAL_BASED_OUTPATIENT_CLINIC_OR_DEPARTMENT_OTHER): Payer: Medicare Other | Admitting: Hematology

## 2019-08-21 DIAGNOSIS — C9000 Multiple myeloma not having achieved remission: Secondary | ICD-10-CM | POA: Diagnosis not present

## 2019-08-21 DIAGNOSIS — Z7982 Long term (current) use of aspirin: Secondary | ICD-10-CM | POA: Diagnosis not present

## 2019-08-21 DIAGNOSIS — Z803 Family history of malignant neoplasm of breast: Secondary | ICD-10-CM | POA: Diagnosis not present

## 2019-08-21 DIAGNOSIS — Z87891 Personal history of nicotine dependence: Secondary | ICD-10-CM | POA: Diagnosis not present

## 2019-08-21 DIAGNOSIS — I4891 Unspecified atrial fibrillation: Secondary | ICD-10-CM | POA: Diagnosis not present

## 2019-08-21 DIAGNOSIS — Z79899 Other long term (current) drug therapy: Secondary | ICD-10-CM | POA: Diagnosis not present

## 2019-08-22 DIAGNOSIS — C9000 Multiple myeloma not having achieved remission: Secondary | ICD-10-CM | POA: Diagnosis not present

## 2019-08-22 DIAGNOSIS — N390 Urinary tract infection, site not specified: Secondary | ICD-10-CM | POA: Diagnosis not present

## 2019-08-29 ENCOUNTER — Other Ambulatory Visit: Payer: Medicare Other

## 2019-08-30 ENCOUNTER — Other Ambulatory Visit: Payer: Medicare Other

## 2019-09-04 DIAGNOSIS — Z5112 Encounter for antineoplastic immunotherapy: Secondary | ICD-10-CM | POA: Diagnosis not present

## 2019-09-04 DIAGNOSIS — C9000 Multiple myeloma not having achieved remission: Secondary | ICD-10-CM | POA: Diagnosis not present

## 2019-09-05 ENCOUNTER — Ambulatory Visit: Payer: Medicare Other | Admitting: Hematology

## 2019-09-05 DIAGNOSIS — C9000 Multiple myeloma not having achieved remission: Secondary | ICD-10-CM | POA: Diagnosis not present

## 2019-09-05 DIAGNOSIS — Z5112 Encounter for antineoplastic immunotherapy: Secondary | ICD-10-CM | POA: Diagnosis not present

## 2019-09-11 DIAGNOSIS — Z5112 Encounter for antineoplastic immunotherapy: Secondary | ICD-10-CM | POA: Diagnosis not present

## 2019-09-11 DIAGNOSIS — C9000 Multiple myeloma not having achieved remission: Secondary | ICD-10-CM | POA: Diagnosis not present

## 2019-09-12 ENCOUNTER — Other Ambulatory Visit: Payer: Self-pay | Admitting: Hematology

## 2019-09-12 ENCOUNTER — Other Ambulatory Visit: Payer: Self-pay | Admitting: *Deleted

## 2019-09-12 DIAGNOSIS — C9000 Multiple myeloma not having achieved remission: Secondary | ICD-10-CM

## 2019-09-12 MED ORDER — LENALIDOMIDE 25 MG PO CAPS: 25.0000 mg | ORAL_CAPSULE | Freq: Every day | ORAL | 0 refills | Status: DC

## 2019-09-12 NOTE — Telephone Encounter (Signed)
Revlimid refilled per Dr.Kale OV note 08/21/2019 [place] to [place] [place] [facility] Auth# [id number], 09/12/2019

## 2019-09-17 DIAGNOSIS — C9000 Multiple myeloma not having achieved remission: Secondary | ICD-10-CM | POA: Diagnosis not present

## 2019-09-20 DIAGNOSIS — C9000 Multiple myeloma not having achieved remission: Secondary | ICD-10-CM | POA: Diagnosis not present

## 2019-09-20 DIAGNOSIS — Z5112 Encounter for antineoplastic immunotherapy: Secondary | ICD-10-CM | POA: Diagnosis not present

## 2019-09-25 ENCOUNTER — Ambulatory Visit: Payer: Medicare Other | Admitting: Orthopaedic Surgery

## 2019-09-27 DIAGNOSIS — Z5112 Encounter for antineoplastic immunotherapy: Secondary | ICD-10-CM | POA: Diagnosis not present

## 2019-09-27 DIAGNOSIS — C9000 Multiple myeloma not having achieved remission: Secondary | ICD-10-CM | POA: Diagnosis not present

## 2019-10-04 DIAGNOSIS — Z5112 Encounter for antineoplastic immunotherapy: Secondary | ICD-10-CM | POA: Diagnosis not present

## 2019-10-04 DIAGNOSIS — C9000 Multiple myeloma not having achieved remission: Secondary | ICD-10-CM | POA: Diagnosis not present

## 2019-10-11 DIAGNOSIS — C9000 Multiple myeloma not having achieved remission: Secondary | ICD-10-CM | POA: Diagnosis not present

## 2019-10-11 DIAGNOSIS — Z5112 Encounter for antineoplastic immunotherapy: Secondary | ICD-10-CM | POA: Diagnosis not present

## 2019-10-14 DIAGNOSIS — I82611 Acute embolism and thrombosis of superficial veins of right upper extremity: Secondary | ICD-10-CM | POA: Diagnosis not present

## 2019-10-18 DIAGNOSIS — C9000 Multiple myeloma not having achieved remission: Secondary | ICD-10-CM | POA: Diagnosis not present

## 2019-11-15 DIAGNOSIS — Z5112 Encounter for antineoplastic immunotherapy: Secondary | ICD-10-CM | POA: Diagnosis not present

## 2019-11-15 DIAGNOSIS — C9000 Multiple myeloma not having achieved remission: Secondary | ICD-10-CM | POA: Diagnosis not present

## 2019-11-21 DIAGNOSIS — Z20828 Contact with and (suspected) exposure to other viral communicable diseases: Secondary | ICD-10-CM | POA: Diagnosis not present

## 2019-11-27 DIAGNOSIS — E538 Deficiency of other specified B group vitamins: Secondary | ICD-10-CM | POA: Diagnosis not present

## 2019-11-27 DIAGNOSIS — Z01818 Encounter for other preprocedural examination: Secondary | ICD-10-CM | POA: Diagnosis not present

## 2019-11-27 DIAGNOSIS — I071 Rheumatic tricuspid insufficiency: Secondary | ICD-10-CM | POA: Diagnosis not present

## 2019-11-27 DIAGNOSIS — I517 Cardiomegaly: Secondary | ICD-10-CM | POA: Diagnosis not present

## 2019-11-27 DIAGNOSIS — I340 Nonrheumatic mitral (valve) insufficiency: Secondary | ICD-10-CM | POA: Diagnosis not present

## 2019-11-27 DIAGNOSIS — I272 Pulmonary hypertension, unspecified: Secondary | ICD-10-CM | POA: Diagnosis not present

## 2019-11-27 DIAGNOSIS — I361 Nonrheumatic tricuspid (valve) insufficiency: Secondary | ICD-10-CM | POA: Diagnosis not present

## 2019-11-27 DIAGNOSIS — Z79899 Other long term (current) drug therapy: Secondary | ICD-10-CM | POA: Diagnosis not present

## 2019-11-27 DIAGNOSIS — C9001 Multiple myeloma in remission: Secondary | ICD-10-CM | POA: Diagnosis not present

## 2019-11-27 DIAGNOSIS — C9000 Multiple myeloma not having achieved remission: Secondary | ICD-10-CM | POA: Diagnosis not present

## 2019-12-02 DIAGNOSIS — E538 Deficiency of other specified B group vitamins: Secondary | ICD-10-CM | POA: Diagnosis not present

## 2019-12-02 DIAGNOSIS — Z01818 Encounter for other preprocedural examination: Secondary | ICD-10-CM | POA: Diagnosis not present

## 2019-12-02 DIAGNOSIS — C9000 Multiple myeloma not having achieved remission: Secondary | ICD-10-CM | POA: Diagnosis not present

## 2019-12-02 DIAGNOSIS — Z79899 Other long term (current) drug therapy: Secondary | ICD-10-CM | POA: Diagnosis not present

## 2019-12-05 DIAGNOSIS — C9000 Multiple myeloma not having achieved remission: Secondary | ICD-10-CM | POA: Diagnosis not present

## 2019-12-07 DIAGNOSIS — Z52091 Other blood donor, stem cells: Secondary | ICD-10-CM | POA: Diagnosis not present

## 2019-12-07 DIAGNOSIS — C9000 Multiple myeloma not having achieved remission: Secondary | ICD-10-CM | POA: Diagnosis not present

## 2019-12-08 DIAGNOSIS — C9000 Multiple myeloma not having achieved remission: Secondary | ICD-10-CM | POA: Diagnosis not present

## 2019-12-08 DIAGNOSIS — Z79899 Other long term (current) drug therapy: Secondary | ICD-10-CM | POA: Diagnosis not present

## 2019-12-08 DIAGNOSIS — Z9484 Stem cells transplant status: Secondary | ICD-10-CM | POA: Diagnosis not present

## 2019-12-09 DIAGNOSIS — C9000 Multiple myeloma not having achieved remission: Secondary | ICD-10-CM | POA: Diagnosis not present

## 2019-12-10 DIAGNOSIS — Z52011 Autologous donor, stem cells: Secondary | ICD-10-CM | POA: Diagnosis not present

## 2019-12-10 DIAGNOSIS — Z66 Do not resuscitate: Secondary | ICD-10-CM | POA: Diagnosis not present

## 2019-12-10 DIAGNOSIS — Z79899 Other long term (current) drug therapy: Secondary | ICD-10-CM | POA: Diagnosis not present

## 2019-12-10 DIAGNOSIS — C9000 Multiple myeloma not having achieved remission: Secondary | ICD-10-CM | POA: Diagnosis not present

## 2019-12-10 DIAGNOSIS — I4891 Unspecified atrial fibrillation: Secondary | ICD-10-CM | POA: Diagnosis not present

## 2019-12-10 DIAGNOSIS — Z452 Encounter for adjustment and management of vascular access device: Secondary | ICD-10-CM | POA: Diagnosis not present

## 2019-12-11 DIAGNOSIS — C9000 Multiple myeloma not having achieved remission: Secondary | ICD-10-CM | POA: Diagnosis not present

## 2019-12-11 DIAGNOSIS — Z52011 Autologous donor, stem cells: Secondary | ICD-10-CM | POA: Diagnosis not present

## 2019-12-11 DIAGNOSIS — T80319A ABO incompatibility with hemolytic transfusion reaction, unspecified, initial encounter: Secondary | ICD-10-CM | POA: Diagnosis not present

## 2019-12-13 DIAGNOSIS — Z01812 Encounter for preprocedural laboratory examination: Secondary | ICD-10-CM | POA: Diagnosis not present

## 2019-12-13 DIAGNOSIS — Z20822 Contact with and (suspected) exposure to covid-19: Secondary | ICD-10-CM | POA: Diagnosis not present

## 2019-12-13 DIAGNOSIS — C9000 Multiple myeloma not having achieved remission: Secondary | ICD-10-CM | POA: Diagnosis not present

## 2019-12-16 DIAGNOSIS — M150 Primary generalized (osteo)arthritis: Secondary | ICD-10-CM | POA: Diagnosis not present

## 2019-12-16 DIAGNOSIS — M1990 Unspecified osteoarthritis, unspecified site: Secondary | ICD-10-CM | POA: Diagnosis not present

## 2019-12-16 DIAGNOSIS — Z9484 Stem cells transplant status: Secondary | ICD-10-CM | POA: Diagnosis not present

## 2019-12-16 DIAGNOSIS — C9000 Multiple myeloma not having achieved remission: Secondary | ICD-10-CM | POA: Diagnosis not present

## 2019-12-16 DIAGNOSIS — D61810 Antineoplastic chemotherapy induced pancytopenia: Secondary | ICD-10-CM | POA: Diagnosis not present

## 2019-12-17 DIAGNOSIS — R519 Headache, unspecified: Secondary | ICD-10-CM | POA: Diagnosis not present

## 2019-12-17 DIAGNOSIS — M1990 Unspecified osteoarthritis, unspecified site: Secondary | ICD-10-CM | POA: Diagnosis not present

## 2019-12-17 DIAGNOSIS — Z9484 Stem cells transplant status: Secondary | ICD-10-CM | POA: Diagnosis not present

## 2019-12-17 DIAGNOSIS — D61810 Antineoplastic chemotherapy induced pancytopenia: Secondary | ICD-10-CM | POA: Diagnosis not present

## 2019-12-17 DIAGNOSIS — Z791 Long term (current) use of non-steroidal anti-inflammatories (NSAID): Secondary | ICD-10-CM | POA: Diagnosis not present

## 2019-12-17 DIAGNOSIS — T451X5A Adverse effect of antineoplastic and immunosuppressive drugs, initial encounter: Secondary | ICD-10-CM | POA: Diagnosis not present

## 2019-12-17 DIAGNOSIS — C9000 Multiple myeloma not having achieved remission: Secondary | ICD-10-CM | POA: Diagnosis not present

## 2019-12-18 DIAGNOSIS — T451X5A Adverse effect of antineoplastic and immunosuppressive drugs, initial encounter: Secondary | ICD-10-CM | POA: Diagnosis not present

## 2019-12-18 DIAGNOSIS — D61810 Antineoplastic chemotherapy induced pancytopenia: Secondary | ICD-10-CM | POA: Diagnosis not present

## 2019-12-18 DIAGNOSIS — Z9484 Stem cells transplant status: Secondary | ICD-10-CM | POA: Diagnosis not present

## 2019-12-18 DIAGNOSIS — R519 Headache, unspecified: Secondary | ICD-10-CM | POA: Diagnosis not present

## 2019-12-18 DIAGNOSIS — C9000 Multiple myeloma not having achieved remission: Secondary | ICD-10-CM | POA: Diagnosis not present

## 2019-12-19 DIAGNOSIS — D61810 Antineoplastic chemotherapy induced pancytopenia: Secondary | ICD-10-CM | POA: Diagnosis not present

## 2019-12-19 DIAGNOSIS — R1110 Vomiting, unspecified: Secondary | ICD-10-CM | POA: Diagnosis not present

## 2019-12-19 DIAGNOSIS — M1990 Unspecified osteoarthritis, unspecified site: Secondary | ICD-10-CM | POA: Diagnosis not present

## 2019-12-19 DIAGNOSIS — Z9484 Stem cells transplant status: Secondary | ICD-10-CM | POA: Diagnosis not present

## 2019-12-19 DIAGNOSIS — K5900 Constipation, unspecified: Secondary | ICD-10-CM | POA: Diagnosis not present

## 2019-12-19 DIAGNOSIS — T451X5A Adverse effect of antineoplastic and immunosuppressive drugs, initial encounter: Secondary | ICD-10-CM | POA: Diagnosis not present

## 2019-12-19 DIAGNOSIS — C9000 Multiple myeloma not having achieved remission: Secondary | ICD-10-CM | POA: Diagnosis not present

## 2019-12-19 DIAGNOSIS — Z791 Long term (current) use of non-steroidal anti-inflammatories (NSAID): Secondary | ICD-10-CM | POA: Diagnosis not present

## 2019-12-19 DIAGNOSIS — R519 Headache, unspecified: Secondary | ICD-10-CM | POA: Diagnosis not present

## 2019-12-20 DIAGNOSIS — R519 Headache, unspecified: Secondary | ICD-10-CM | POA: Diagnosis not present

## 2019-12-20 DIAGNOSIS — C9000 Multiple myeloma not having achieved remission: Secondary | ICD-10-CM | POA: Diagnosis not present

## 2019-12-20 DIAGNOSIS — D61810 Antineoplastic chemotherapy induced pancytopenia: Secondary | ICD-10-CM | POA: Diagnosis not present

## 2019-12-20 DIAGNOSIS — K5900 Constipation, unspecified: Secondary | ICD-10-CM | POA: Diagnosis not present

## 2019-12-20 DIAGNOSIS — R112 Nausea with vomiting, unspecified: Secondary | ICD-10-CM | POA: Diagnosis not present

## 2019-12-20 DIAGNOSIS — Z9484 Stem cells transplant status: Secondary | ICD-10-CM | POA: Diagnosis not present

## 2019-12-20 DIAGNOSIS — T451X5A Adverse effect of antineoplastic and immunosuppressive drugs, initial encounter: Secondary | ICD-10-CM | POA: Diagnosis not present

## 2019-12-21 DIAGNOSIS — T451X5A Adverse effect of antineoplastic and immunosuppressive drugs, initial encounter: Secondary | ICD-10-CM | POA: Diagnosis not present

## 2019-12-21 DIAGNOSIS — D61810 Antineoplastic chemotherapy induced pancytopenia: Secondary | ICD-10-CM | POA: Diagnosis not present

## 2019-12-21 DIAGNOSIS — Z9484 Stem cells transplant status: Secondary | ICD-10-CM | POA: Diagnosis not present

## 2019-12-21 DIAGNOSIS — R112 Nausea with vomiting, unspecified: Secondary | ICD-10-CM | POA: Diagnosis not present

## 2019-12-21 DIAGNOSIS — C9000 Multiple myeloma not having achieved remission: Secondary | ICD-10-CM | POA: Diagnosis not present

## 2019-12-22 DIAGNOSIS — D61810 Antineoplastic chemotherapy induced pancytopenia: Secondary | ICD-10-CM | POA: Diagnosis not present

## 2019-12-22 DIAGNOSIS — Z9484 Stem cells transplant status: Secondary | ICD-10-CM | POA: Diagnosis not present

## 2019-12-22 DIAGNOSIS — C9000 Multiple myeloma not having achieved remission: Secondary | ICD-10-CM | POA: Diagnosis not present

## 2019-12-22 DIAGNOSIS — K5900 Constipation, unspecified: Secondary | ICD-10-CM | POA: Diagnosis not present

## 2019-12-23 DIAGNOSIS — C9000 Multiple myeloma not having achieved remission: Secondary | ICD-10-CM | POA: Diagnosis not present

## 2019-12-23 DIAGNOSIS — C9001 Multiple myeloma in remission: Secondary | ICD-10-CM | POA: Diagnosis not present

## 2019-12-23 DIAGNOSIS — K5730 Diverticulosis of large intestine without perforation or abscess without bleeding: Secondary | ICD-10-CM | POA: Diagnosis not present

## 2019-12-23 DIAGNOSIS — Z52011 Autologous donor, stem cells: Secondary | ICD-10-CM | POA: Diagnosis not present

## 2019-12-23 DIAGNOSIS — D61810 Antineoplastic chemotherapy induced pancytopenia: Secondary | ICD-10-CM | POA: Diagnosis not present

## 2019-12-23 DIAGNOSIS — R197 Diarrhea, unspecified: Secondary | ICD-10-CM | POA: Diagnosis not present

## 2019-12-24 DIAGNOSIS — Z9484 Stem cells transplant status: Secondary | ICD-10-CM | POA: Diagnosis not present

## 2019-12-24 DIAGNOSIS — C9000 Multiple myeloma not having achieved remission: Secondary | ICD-10-CM | POA: Diagnosis not present

## 2019-12-24 DIAGNOSIS — D61810 Antineoplastic chemotherapy induced pancytopenia: Secondary | ICD-10-CM | POA: Diagnosis not present

## 2019-12-24 DIAGNOSIS — K5900 Constipation, unspecified: Secondary | ICD-10-CM | POA: Diagnosis not present

## 2019-12-25 DIAGNOSIS — I4892 Unspecified atrial flutter: Secondary | ICD-10-CM | POA: Diagnosis not present

## 2019-12-25 DIAGNOSIS — D61810 Antineoplastic chemotherapy induced pancytopenia: Secondary | ICD-10-CM | POA: Diagnosis present

## 2019-12-25 DIAGNOSIS — Z9484 Stem cells transplant status: Secondary | ICD-10-CM | POA: Diagnosis not present

## 2019-12-25 DIAGNOSIS — Z791 Long term (current) use of non-steroidal anti-inflammatories (NSAID): Secondary | ICD-10-CM | POA: Diagnosis not present

## 2019-12-25 DIAGNOSIS — K529 Noninfective gastroenteritis and colitis, unspecified: Secondary | ICD-10-CM | POA: Diagnosis present

## 2019-12-25 DIAGNOSIS — C9000 Multiple myeloma not having achieved remission: Secondary | ICD-10-CM | POA: Diagnosis present

## 2019-12-25 DIAGNOSIS — Z79899 Other long term (current) drug therapy: Secondary | ICD-10-CM | POA: Diagnosis not present

## 2019-12-25 DIAGNOSIS — Z96659 Presence of unspecified artificial knee joint: Secondary | ICD-10-CM | POA: Diagnosis present

## 2019-12-25 DIAGNOSIS — R197 Diarrhea, unspecified: Secondary | ICD-10-CM | POA: Diagnosis not present

## 2019-12-25 DIAGNOSIS — K1230 Oral mucositis (ulcerative), unspecified: Secondary | ICD-10-CM | POA: Diagnosis present

## 2019-12-25 DIAGNOSIS — I484 Atypical atrial flutter: Secondary | ICD-10-CM | POA: Diagnosis not present

## 2019-12-25 DIAGNOSIS — T451X5A Adverse effect of antineoplastic and immunosuppressive drugs, initial encounter: Secondary | ICD-10-CM | POA: Diagnosis present

## 2019-12-25 DIAGNOSIS — I4439 Other atrioventricular block: Secondary | ICD-10-CM | POA: Diagnosis not present

## 2019-12-25 DIAGNOSIS — C9001 Multiple myeloma in remission: Secondary | ICD-10-CM | POA: Diagnosis not present

## 2019-12-25 DIAGNOSIS — R112 Nausea with vomiting, unspecified: Secondary | ICD-10-CM | POA: Diagnosis not present

## 2019-12-25 DIAGNOSIS — D696 Thrombocytopenia, unspecified: Secondary | ICD-10-CM | POA: Diagnosis not present

## 2019-12-29 DIAGNOSIS — R0902 Hypoxemia: Secondary | ICD-10-CM | POA: Diagnosis not present

## 2019-12-29 DIAGNOSIS — Z9484 Stem cells transplant status: Secondary | ICD-10-CM | POA: Diagnosis not present

## 2019-12-29 DIAGNOSIS — I484 Atypical atrial flutter: Secondary | ICD-10-CM | POA: Diagnosis not present

## 2019-12-29 DIAGNOSIS — J918 Pleural effusion in other conditions classified elsewhere: Secondary | ICD-10-CM | POA: Diagnosis not present

## 2019-12-29 DIAGNOSIS — R9431 Abnormal electrocardiogram [ECG] [EKG]: Secondary | ICD-10-CM | POA: Diagnosis not present

## 2019-12-29 DIAGNOSIS — R110 Nausea: Secondary | ICD-10-CM | POA: Diagnosis not present

## 2019-12-29 DIAGNOSIS — R918 Other nonspecific abnormal finding of lung field: Secondary | ICD-10-CM | POA: Diagnosis not present

## 2019-12-29 DIAGNOSIS — R112 Nausea with vomiting, unspecified: Secondary | ICD-10-CM | POA: Diagnosis not present

## 2019-12-29 DIAGNOSIS — R52 Pain, unspecified: Secondary | ICD-10-CM | POA: Diagnosis not present

## 2019-12-29 DIAGNOSIS — R0789 Other chest pain: Secondary | ICD-10-CM | POA: Diagnosis not present

## 2019-12-29 DIAGNOSIS — C9000 Multiple myeloma not having achieved remission: Secondary | ICD-10-CM | POA: Diagnosis not present

## 2019-12-29 DIAGNOSIS — I498 Other specified cardiac arrhythmias: Secondary | ICD-10-CM | POA: Diagnosis not present

## 2019-12-29 DIAGNOSIS — R0602 Shortness of breath: Secondary | ICD-10-CM | POA: Diagnosis not present

## 2019-12-29 DIAGNOSIS — R079 Chest pain, unspecified: Secondary | ICD-10-CM | POA: Diagnosis not present

## 2019-12-29 DIAGNOSIS — I4891 Unspecified atrial fibrillation: Secondary | ICD-10-CM | POA: Diagnosis not present

## 2019-12-29 DIAGNOSIS — J90 Pleural effusion, not elsewhere classified: Secondary | ICD-10-CM | POA: Diagnosis not present

## 2019-12-29 DIAGNOSIS — T865 Complications of stem cell transplant: Secondary | ICD-10-CM | POA: Diagnosis not present

## 2019-12-29 DIAGNOSIS — Z20822 Contact with and (suspected) exposure to covid-19: Secondary | ICD-10-CM | POA: Diagnosis not present

## 2019-12-29 DIAGNOSIS — D61810 Antineoplastic chemotherapy induced pancytopenia: Secondary | ICD-10-CM | POA: Diagnosis not present

## 2019-12-29 DIAGNOSIS — R531 Weakness: Secondary | ICD-10-CM | POA: Diagnosis not present

## 2019-12-30 DIAGNOSIS — D630 Anemia in neoplastic disease: Secondary | ICD-10-CM | POA: Diagnosis present

## 2019-12-30 DIAGNOSIS — E8809 Other disorders of plasma-protein metabolism, not elsewhere classified: Secondary | ICD-10-CM | POA: Diagnosis present

## 2019-12-30 DIAGNOSIS — T865 Complications of stem cell transplant: Secondary | ICD-10-CM | POA: Diagnosis present

## 2019-12-30 DIAGNOSIS — Z791 Long term (current) use of non-steroidal anti-inflammatories (NSAID): Secondary | ICD-10-CM | POA: Diagnosis not present

## 2019-12-30 DIAGNOSIS — Z66 Do not resuscitate: Secondary | ICD-10-CM | POA: Diagnosis present

## 2019-12-30 DIAGNOSIS — R519 Headache, unspecified: Secondary | ICD-10-CM | POA: Diagnosis present

## 2019-12-30 DIAGNOSIS — I493 Ventricular premature depolarization: Secondary | ICD-10-CM | POA: Diagnosis not present

## 2019-12-30 DIAGNOSIS — R0602 Shortness of breath: Secondary | ICD-10-CM | POA: Diagnosis not present

## 2019-12-30 DIAGNOSIS — Z792 Long term (current) use of antibiotics: Secondary | ICD-10-CM | POA: Diagnosis not present

## 2019-12-30 DIAGNOSIS — Z20822 Contact with and (suspected) exposure to covid-19: Secondary | ICD-10-CM | POA: Diagnosis present

## 2019-12-30 DIAGNOSIS — J918 Pleural effusion in other conditions classified elsewhere: Secondary | ICD-10-CM | POA: Diagnosis present

## 2019-12-30 DIAGNOSIS — C9000 Multiple myeloma not having achieved remission: Secondary | ICD-10-CM | POA: Diagnosis present

## 2019-12-30 DIAGNOSIS — R197 Diarrhea, unspecified: Secondary | ICD-10-CM | POA: Diagnosis present

## 2019-12-30 DIAGNOSIS — D696 Thrombocytopenia, unspecified: Secondary | ICD-10-CM | POA: Diagnosis not present

## 2019-12-30 DIAGNOSIS — I272 Pulmonary hypertension, unspecified: Secondary | ICD-10-CM | POA: Diagnosis present

## 2019-12-30 DIAGNOSIS — T451X5A Adverse effect of antineoplastic and immunosuppressive drugs, initial encounter: Secondary | ICD-10-CM | POA: Diagnosis present

## 2019-12-30 DIAGNOSIS — R112 Nausea with vomiting, unspecified: Secondary | ICD-10-CM | POA: Diagnosis present

## 2019-12-30 DIAGNOSIS — R079 Chest pain, unspecified: Secondary | ICD-10-CM | POA: Diagnosis not present

## 2019-12-30 DIAGNOSIS — Z79899 Other long term (current) drug therapy: Secondary | ICD-10-CM | POA: Diagnosis not present

## 2019-12-30 DIAGNOSIS — G629 Polyneuropathy, unspecified: Secondary | ICD-10-CM | POA: Diagnosis present

## 2019-12-30 DIAGNOSIS — I482 Chronic atrial fibrillation, unspecified: Secondary | ICD-10-CM | POA: Diagnosis not present

## 2019-12-30 DIAGNOSIS — I4891 Unspecified atrial fibrillation: Secondary | ICD-10-CM | POA: Diagnosis present

## 2019-12-30 DIAGNOSIS — M170 Bilateral primary osteoarthritis of knee: Secondary | ICD-10-CM | POA: Diagnosis present

## 2019-12-30 DIAGNOSIS — Z7982 Long term (current) use of aspirin: Secondary | ICD-10-CM | POA: Diagnosis not present

## 2019-12-30 DIAGNOSIS — I484 Atypical atrial flutter: Secondary | ICD-10-CM | POA: Diagnosis present

## 2019-12-30 DIAGNOSIS — D61810 Antineoplastic chemotherapy induced pancytopenia: Secondary | ICD-10-CM | POA: Diagnosis present

## 2019-12-30 DIAGNOSIS — Z9484 Stem cells transplant status: Secondary | ICD-10-CM | POA: Diagnosis not present

## 2019-12-30 DIAGNOSIS — E559 Vitamin D deficiency, unspecified: Secondary | ICD-10-CM | POA: Diagnosis present

## 2019-12-30 DIAGNOSIS — K1230 Oral mucositis (ulcerative), unspecified: Secondary | ICD-10-CM | POA: Diagnosis present

## 2019-12-30 DIAGNOSIS — Z452 Encounter for adjustment and management of vascular access device: Secondary | ICD-10-CM | POA: Diagnosis not present

## 2019-12-30 DIAGNOSIS — J90 Pleural effusion, not elsewhere classified: Secondary | ICD-10-CM | POA: Diagnosis not present

## 2020-01-03 DIAGNOSIS — C9000 Multiple myeloma not having achieved remission: Secondary | ICD-10-CM | POA: Diagnosis not present

## 2020-01-06 DIAGNOSIS — C9000 Multiple myeloma not having achieved remission: Secondary | ICD-10-CM | POA: Diagnosis not present

## 2020-01-14 DIAGNOSIS — Z7983 Long term (current) use of bisphosphonates: Secondary | ICD-10-CM | POA: Diagnosis not present

## 2020-01-14 DIAGNOSIS — Z9484 Stem cells transplant status: Secondary | ICD-10-CM | POA: Diagnosis not present

## 2020-01-14 DIAGNOSIS — E876 Hypokalemia: Secondary | ICD-10-CM | POA: Diagnosis not present

## 2020-01-14 DIAGNOSIS — C9000 Multiple myeloma not having achieved remission: Secondary | ICD-10-CM | POA: Diagnosis not present

## 2020-02-02 DIAGNOSIS — C9000 Multiple myeloma not having achieved remission: Secondary | ICD-10-CM | POA: Diagnosis not present

## 2020-02-19 DIAGNOSIS — C9000 Multiple myeloma not having achieved remission: Secondary | ICD-10-CM | POA: Diagnosis not present

## 2020-02-24 ENCOUNTER — Telehealth: Payer: Self-pay

## 2020-02-24 NOTE — Telephone Encounter (Signed)
Oral Oncology Patient Advocate Encounter  Received notification from [facility] that prior authorization for Revlimid is required.  PA submitted on CoverMyMeds Key [id number] Status is pending  Oral Oncology Clinic will continue to follow.   [place] Patient [place] Phone [phone] Fax [phone] 02/24/2020 9:22 AM

## 2020-02-24 NOTE — Telephone Encounter (Signed)
Oral Oncology Patient Advocate Encounter  Prior Authorization for Revlimid has been approved.    PA# [id number] Effective dates: 11/25/19 through 02/23/21   Oral Oncology Clinic will continue to follow.   Jennifer [name] Patient [place] Phone [phone] Fax [phone] 02/24/2020 9:23 AM

## 2020-03-09 DIAGNOSIS — R609 Edema, unspecified: Secondary | ICD-10-CM | POA: Diagnosis not present

## 2020-03-09 DIAGNOSIS — M25471 Effusion, right ankle: Secondary | ICD-10-CM | POA: Diagnosis not present

## 2020-03-09 DIAGNOSIS — C9000 Multiple myeloma not having achieved remission: Secondary | ICD-10-CM | POA: Diagnosis not present

## 2020-03-09 DIAGNOSIS — R600 Localized edema: Secondary | ICD-10-CM | POA: Diagnosis not present

## 2020-03-10 DIAGNOSIS — R600 Localized edema: Secondary | ICD-10-CM | POA: Diagnosis not present

## 2020-03-10 DIAGNOSIS — M25471 Effusion, right ankle: Secondary | ICD-10-CM | POA: Diagnosis not present

## 2020-03-10 DIAGNOSIS — F17210 Nicotine dependence, cigarettes, uncomplicated: Secondary | ICD-10-CM | POA: Diagnosis not present

## 2020-03-10 DIAGNOSIS — Z8579 Personal history of other malignant neoplasms of lymphoid, hematopoietic and related tissues: Secondary | ICD-10-CM | POA: Diagnosis not present

## 2020-03-18 DIAGNOSIS — Z7901 Long term (current) use of anticoagulants: Secondary | ICD-10-CM | POA: Diagnosis not present

## 2020-03-18 DIAGNOSIS — I484 Atypical atrial flutter: Secondary | ICD-10-CM | POA: Diagnosis not present

## 2020-03-19 DIAGNOSIS — C9000 Multiple myeloma not having achieved remission: Secondary | ICD-10-CM | POA: Diagnosis not present

## 2020-03-25 DIAGNOSIS — Z9484 Stem cells transplant status: Secondary | ICD-10-CM | POA: Diagnosis not present

## 2020-03-25 DIAGNOSIS — C9000 Multiple myeloma not having achieved remission: Secondary | ICD-10-CM | POA: Diagnosis not present

## 2020-04-01 ENCOUNTER — Encounter: Payer: Self-pay | Admitting: Hematology

## 2020-04-01 DIAGNOSIS — Z79899 Other long term (current) drug therapy: Secondary | ICD-10-CM | POA: Diagnosis not present

## 2020-04-01 DIAGNOSIS — M79673 Pain in unspecified foot: Secondary | ICD-10-CM | POA: Diagnosis not present

## 2020-04-01 DIAGNOSIS — C9000 Multiple myeloma not having achieved remission: Secondary | ICD-10-CM | POA: Diagnosis not present

## 2020-04-01 DIAGNOSIS — E876 Hypokalemia: Secondary | ICD-10-CM | POA: Diagnosis not present

## 2020-04-01 DIAGNOSIS — M1990 Unspecified osteoarthritis, unspecified site: Secondary | ICD-10-CM | POA: Diagnosis not present

## 2020-04-01 DIAGNOSIS — Z7983 Long term (current) use of bisphosphonates: Secondary | ICD-10-CM | POA: Diagnosis not present

## 2020-04-01 DIAGNOSIS — Z9484 Stem cells transplant status: Secondary | ICD-10-CM | POA: Diagnosis not present

## 2020-04-02 DIAGNOSIS — C9000 Multiple myeloma not having achieved remission: Secondary | ICD-10-CM | POA: Diagnosis not present

## 2020-04-03 ENCOUNTER — Telehealth: Payer: Self-pay

## 2020-04-03 NOTE — Telephone Encounter (Signed)
Per Dr. Irene Limbo: scheduling message sent for Lab and MD visit in 2 weeks.  Scheduling to call patient with appt. date and time.

## 2020-04-08 ENCOUNTER — Telehealth: Payer: Self-pay | Admitting: Hematology

## 2020-04-08 NOTE — Telephone Encounter (Signed)
Scheduled appt per 5/28 sch message-per GK okay 3 wks out . Pt is aware of appt date and time

## 2020-04-14 DIAGNOSIS — Z23 Encounter for immunization: Secondary | ICD-10-CM | POA: Diagnosis not present

## 2020-04-16 DIAGNOSIS — C9000 Multiple myeloma not having achieved remission: Secondary | ICD-10-CM | POA: Diagnosis not present

## 2020-04-23 DIAGNOSIS — C9000 Multiple myeloma not having achieved remission: Secondary | ICD-10-CM | POA: Diagnosis not present

## 2020-04-23 DIAGNOSIS — Z5112 Encounter for antineoplastic immunotherapy: Secondary | ICD-10-CM | POA: Diagnosis not present

## 2020-04-27 ENCOUNTER — Encounter: Payer: Self-pay | Admitting: Hematology

## 2020-04-30 NOTE — Progress Notes (Signed)
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 We are joined by pt's husband. The patient's last visit with [name] was on 08/21/2019. The pt reports that she is doing well overall.  The pt reports she is good. She has been going to [facility] for treatments. Pt has gotten 1 dose of the COVID19 vaccine and is scheduled for the next. She is going to [facility] for atrial flutter ablation. Pt is staying active with pickle ball. She is still on bactrim.   Lab results today (05/01/20) of CBC w/diff and CMP is as follows: all values are WNL except for CO2 at 21, GFR, Est Non Af Am at 59 05/01/20 of MMP is as follows: all values pending 05/01/20 of Kappa/Lamda Light Chains is as follows: pending  On review of systems, pt reports staying active, healthy appetite and denies abdominal pain, pedal edema and any other symptoms.    MEDICAL HISTORY:  Past Medical History:  Diagnosis Date  . Atrial fibrillation ([place]) 12/2010   PMH of ; [place] , [place] [facility]  . DJD (degenerative joint disease)   . History of blood transfusion 3382   complication after childbirth  . Osteoarthritis    "knees, hands, fingers, toes" (09/18/2018)  . PONV (postoperative nausea and vomiting) 09/2018   nausea only with knee surgery  . Skin cancer of face 2015   Patient reports this was likely basal cell on the right side of her face needing 14 stitches.  . Smoldering multiple myeloma ([place]) 2016   "pre bone cancer; being monitored  for this q 4 months or so" (09/18/2018)  . Squamous cell carcinoma of scalp 2000   S/P MOHS   . Patient Active Problem List   Diagnosis Date Noted  . Chronic left shoulder pain 11/14/2018  . History of total right knee replacement 10/17/2018  . Osteoarthritis of right knee 09/18/2018  . Primary osteoarthritis of both knees 11/30/2017  . Primary osteoarthritis of both feet 11/30/2017  . History of rotator cuff tear repair, bilateral 11/30/2017  . Primary osteoarthritis of right knee 08/23/2017  . Vitamin D deficiency 12/27/2015  . Smoldering multiple myeloma ([place]) 12/22/2015  . Hypergammaglobulinemia   . IgG monoclonal gammopathy of uncertain significance   . Abnormal gamma globulin level 04/14/2015  . Hyperlipidemia 04/03/2015  . Hyperglycemia 04/03/2015  . Diverticulosis of colon without hemorrhage 04/03/2015  . Arthralgia of multiple joints 04/03/2015  . Primary osteoarthritis of both hands 01/20/2010  . SKIN CANCER, HX OF 01/20/2010    SURGICAL HISTORY: Past Surgical History:  Procedure Laterality Date  . CATARACT EXTRACTION W/ INTRAOCULAR LENS  IMPLANT, BILATERAL Bilateral ~ 2015  . CHOLECYSTECTOMY OPEN  1990's  . COLONOSCOPY  02/2013   negative X 3; Dr Olevia Perches  . G 3 P 1    . HAMMER TOE SURGERY Left 2018   2nd digit  . JOINT REPLACEMENT    . MOHS SURGERY  ~ 2009   "back of my head"  . SHOULDER ARTHROSCOPY W/ ROTATOR CUFF REPAIR  Right 2012    Dr Durward Fortes  . SHOULDER ARTHROSCOPY W/ ROTATOR CUFF REPAIR Left 06/2014   Dr. Sydnee Cabal for rotator cuff  . TONSILLECTOMY AND ADENOIDECTOMY    . TOTAL KNEE ARTHROPLASTY Right 09/18/2018  . TOTAL KNEE ARTHROPLASTY Right 09/18/2018   Procedure: RIGHT TOTAL KNEE ARTHROPLASTY;  Surgeon: Garald Balding, MD;  Location: [place];  Service: Orthopedics;  Laterality: Right;  . WISDOM TOOTH EXTRACTION  1960s    SOCIAL HISTORY: Social History   Socioeconomic History  . Marital status: Married    Spouse name: Not on file  . Number  of children: Not on file  . Years of education: Not on file  . Highest education level: Not on file  Occupational History  . Not on file  Tobacco Use  . Smoking status: Former Smoker    Packs/day: 0.25    Years: 9.00    Pack years: 2.25    Quit date: 11/07/1970    Years since quitting: 49.5  . Smokeless tobacco: Never Used  . Tobacco comment: 1/4 ppd [id number]  Vaping Use  . Vaping Use: Never used  Substance and Sexual Activity  . Alcohol use: Yes    Alcohol/week: 3.0 standard drinks    Types: 3 Glasses of wine per week  . Drug use: Never  . Sexual activity: Not Currently    Partners: Male  Other Topics Concern  . Not on file  Social History Narrative  . Not on file   Social Determinants of Health   Financial Resource Strain:   . Difficulty of Paying Living Expenses:   Food Insecurity:   . Worried About Charity fundraiser in the Last Year:   . Arboriculturist in the Last Year:   Transportation Needs:   . Film/video editor (Medical):   Marland Kitchen Lack of Transportation (Non-Medical):   Physical Activity:   . Days of Exercise per Week:   . Minutes of Exercise per Session:   Stress:   . Feeling of Stress :   Social Connections:   . Frequency of Communication with Friends and Family:   . Frequency of Social Gatherings with Friends and Family:   . Attends Religious Services:   . Active Member of Clubs or Organizations:   . Attends Archivist Meetings:   Marland Kitchen Marital Status:   Intimate Partner Violence:   . Fear of Current or Ex-Partner:   . Emotionally Abused:   Marland Kitchen Physically Abused:   . Sexually Abused:     FAMILY HISTORY: Family History  Problem Relation Age of Onset  . Atrial fibrillation Father   . Asthma Brother   . Alzheimer's disease Mother   . Myasthenia gravis Mother        ocular  . Breast cancer Paternal Grandmother   . Depression Daughter   . Colon cancer Neg Hx   . Rectal cancer Neg Hx   . Stomach cancer Neg Hx   . Diabetes Neg Hx   .  Stroke Neg Hx   . Heart attack Neg Hx     ALLERGIES:  has No Known Allergies.  MEDICATIONS:  Current Outpatient Medications  Medication Sig Dispense Refill  . acyclovir (ZOVIRAX) 400 MG tablet Take 1 tablet (400 mg total) by mouth 2 (two) times daily. 60 tablet 6  . metoprolol succinate (TOPROL-XL) 50 MG 24 hr tablet Take 150 mg by mouth daily.    . multivitamin-iron-minerals-folic acid (CENTRUM) chewable tablet Chew 1 tablet by mouth daily.    Marland Kitchen  Sulfamethoxazole-Trimethoprim (BACTRIM PO) Take by mouth 3 (three) times a week.    Marland Kitchen aspirin EC 81 MG tablet Take 81 mg by mouth daily. (Patient not taking: Reported on 05/01/2020)    . dexamethasone (DECADRON) 4 MG tablet Take 10 tablets (40 mg total) by mouth once a week. With food. 40 tablet 3  . lenalidomide (REVLIMID) 25 MG capsule Take 1 capsule (25 mg total) by mouth daily. Start on Day 1 of cycle and take for 21 days then 7 days off 21 capsule 0  . Multiple Vitamin (MULTIVITAMIN ADULT PO) Take by mouth. (Patient not taking: Reported on 05/01/2020)    . NINLARO 4 MG capsule TAKE 1 CAPSULE BY MOUTH ON DAYS 1, 8, 15 OF A 28 DAY CYCLE. TAKE ON ANEMPTY STOMACH AT LEAST 1HR BEFORE OR 2HR AFTER EATING. SWALLOW WHOLE.  STORE IN ORIGINAL PKG 3 capsule 2  . ondansetron (ZOFRAN) 8 MG tablet Take 1 tablet (8 mg total) by mouth every 8 (eight) hours as needed for nausea or vomiting. Take 1 tab 30-84mns [facility] to [facility](Patient not taking: Reported on 05/01/2020) 30 tablet 3   No current facility-administered medications for this visit.    REVIEW OF SYSTEMS:   A 10+ POINT REVIEW OF SYSTEMS WAS OBTAINED including neurology, dermatology, psychiatry, cardiac, respiratory, lymph, extremities, GI, GU, Musculoskeletal, constitutional, breasts, reproductive, HEENT.  All pertinent positives are noted in the HPI.  All others are negative.   PHYSICAL EXAMINATION: Vitals:   05/01/20 1102  BP: 130/65  Pulse: (!) 40  Resp: 20  Temp: 97.6 F (36.4 C)  SpO2:  100%   Wt Readings from Last 3 Encounters:  05/01/20 129 lb (58.5 kg)  05/21/19 145 lb 6.4 oz (66 kg)  05/06/19 147 lb 3.2 oz (66.8 kg)   Body mass index is 20.2 kg/m.    ECOG FS:0 - Asymptomatic  GENERAL:alert, in no acute distress and comfortable SKIN: no acute rashes, no significant lesions EYES: conjunctiva are pink and non-injected, sclera anicteric OROPHARYNX: MMM, no exudates, no oropharyngeal erythema or ulceration NECK: supple, no JVD LYMPH:  no palpable lymphadenopathy in the cervical, axillary or inguinal regions LUNGS: clear to auscultation b/l with normal respiratory effort HEART: regular rate & rhythm ABDOMEN:  normoactive bowel sounds , non tender, not distended. Extremity: no pedal edema PSYCH: alert & oriented x 3 with fluent speech NEURO: no focal motor/sensory deficits  LABORATORY DATA:  I have reviewed the data as listed  . CBC Latest Ref Rng & Units 05/01/2020 08/08/2019 06/27/2019  WBC 4.0 - 10.5 K/uL 6.7 5.2 5.5  Hemoglobin 12.0 - 15.0 g/dL 13.1 13.9 12.2  Hematocrit 36 - 46 % 38.0 41.4 36.4  Platelets 150 - 400 K/uL 201 247 182   . CBC    Component Value Date/Time   WBC 6.7 05/01/2020 1024   RBC 3.92 05/01/2020 1024   HGB 13.1 05/01/2020 1024   HGB 11.3 (L) 03/22/2019 0940   HGB 12.4 10/26/2017 1002   HCT 38.0 05/01/2020 1024   HCT 36.2 10/26/2017 1002   PLT 201 05/01/2020 1024   PLT 230 03/22/2019 0940   PLT 235 10/26/2017 1002   MCV 96.9 05/01/2020 1024   MCV 96.0 10/26/2017 1002   MCH 33.4 05/01/2020 1024   MCHC 34.5 05/01/2020 1024   RDW 11.8 05/01/2020 1024   RDW 12.7 10/26/2017 1002   LYMPHSABS 1.4 05/01/2020 1024   LYMPHSABS 2.4 10/26/2017 1002   MONOABS 0.8 05/01/2020 1024   MONOABS 0.2 10/26/2017 1002  EOSABS 0.2 05/01/2020 1024   EOSABS 0.1 10/26/2017 1002   BASOSABS 0.0 05/01/2020 1024   BASOSABS 0.0 10/26/2017 1002    . CMP Latest Ref Rng & Units 05/01/2020 08/08/2019 06/27/2019  Glucose 70 - 99 mg/dL 75 132(H) 126(H)    BUN 8 - 23 mg/dL '20 19 18  '$ Creatinine 0.44 - 1.00 mg/dL 0.95 0.92 0.92  Sodium 135 - 145 mmol/L 140 138 139  Potassium 3.5 - 5.1 mmol/L 4.6 4.1 4.0  Chloride 98 - 111 mmol/L 108 104 106  CO2 22 - 32 mmol/L 21(L) 25 22  Calcium 8.9 - 10.3 mg/dL 9.6 9.1 8.9  Total Protein 6.5 - 8.1 g/dL 6.6 7.7 7.1  Total Bilirubin 0.3 - 1.2 mg/dL 0.5 0.8 0.6  Alkaline Phos 38 - 126 U/L 71 60 55  AST 15 - 41 U/L '21 21 24  '$ ALT 0 - 44 U/L 28 26 42     03/01/18 Cytogenetics:   03/01/18 Bx:     11/27/18 BM Bx:     11/27/18 Cytogenetics:    04/01/20 of Dr. Norma Fredrickson note Diagnosis 1. IgG lambda myeloma with standard risk cytogenetics, initial R-ISS criteria not available  Date of diagnosis: 2016  Disease status: VGPR   Maintenance therapy: Recommend daratumumab SQ monthly and carfilzomib on days 1 and 15 of a 28 day cycle. No dexamethasone. 2. Autologous stem cell transplant: HDCT with Melphalan 200 mg/m2 followed by stem cell rescue on 12/17/2019  TREATMENT:  Induction therapy with ixazomib, dexamethasone was started 02/2019: ixazomib '4mg'$ , dexamethasone '40mg'$  weekly. An all-oral regimen was preferred since she lived too far from the cancer center. M-spike at start of therapy was 4.1 and dropped to 2.2 by the end of 04/2019.  Lenalidomide at '10mg'$  was added on 05/2019 to make IRd for better response to therapy and dropped to 1.4 by 06/04/19. Dose was increased to '25mg'$  on 06/2019 and M-spike dropped to 1.3 by 08/08/19 consistent with a partial response.   He was referred to [facility] myeloma program for transplant consultation and seen on 08/12/19. Serology showed an M-spike of 1.69 suggestive or early relapse. At that time, it was recommended she receive 2 cycles with salvage therapy using daratumumab, carfilzomib, dexamethasone to achieve a VGPR or better prior to transplant. She started this regimen on 09/04/19 and by 10/21/19 had reduction in M-spike to 0.73 consistent with a partial response.    Treatment held in 11/2019 in preparation for autologous stem cell transplant with conditioning using melphalan '200mg'$ /m2.  Labs and Pet Scan   Labs friom 06/17/15 showed an M-spike of 2.6 and a serum free light chain ratio of 0.66 (kappa of 0.73 and lambda of 1.'11mg'$ /dL). There was no evidence of renal failure, anemia, or hypercalcemia. A PET/CT scan from 07/14/15 showe no hypermetabolic lesions and bone marrow biopsy from 07/17/15 showed 35% clonal plasma cells. She was diagnosed as having smoldering myeloma and had surveillance with serology every 3 months. Serology and repeat marrows and imaging studies remained stable but on 11/08/18, the light chain ratio started to go off range to 0.20 (kappa of 6.7 and lambda of 33.8) and M-spike increased to 3.9. Albumin was 3,7 with no anemia, renal failure, or hypercalcemia. A PET/CT from 11/27/18 showed no lesions or plasmacytomas. A bone marrow biopsy from the same day showed 60% clonal plasma cells with FISH positive for t(11;14). She was diagnosed as having transitioned to active myeloma and was offered induction therapy.  Plan   IgG lambda myeloma: status post autologous  stem cell transplant - Day 0 on 12/17/2019. Good hematopoesis. Should start maintenance therapy with daratumumab SQ monthly and carfilzomib on days 1 and 15 of a 28 day cycle to be managed by local oncologist, Dr. Carolyne Fiscal. No dexamethasone.  Hypokalemia: Resolved. K+ 4.5 today. Discontinued oral supplementation as well as Lasix, which should limit depletion.   Foot/Ankle Pain: Patient describes the sensation as aggravating when walking. No numbness or tingling. Nothing at rest or when she is in bed. Still able to do what she wants to do and play pickleball. Continue to monitor at this time.   Prophylaxis: Micheline Chapman. Erney should contnue antibiotics for PCP prophylaxis with Bactrim on and continue antiviral therapy with Acyclovir for the suppression of HSV and VZV. No immunizations are recommended at  this time. Recommended COVID vaccine after Day +120. Other immunizations will be started at the visit 6 months post-transplant.  Bisphosphonate therapy: Continue Zometa every 3 months for a total of 2 years. Continue daily calcium and Vitamin D supplementation. Health promotion: Continued aerobic exercise was encouraged to promote better overall condition and prevent long term complications from treatment.   RADIOGRAPHIC STUDIES: I have personally reviewed the radiological images as listed and agreed with the findings in the report. No results found. Normal DEXA scan in June 2015  Cytogenetic analysis: Revealed the presence of a normal female chromosomes with no observable clonal chromosomal abnormalities.   ASSESSMENT & PLAN:   74 y.o. Caucasian female in good overall health with  #1 Multiple Myeloma IgG lambda. Bone marrow plasma cells more than 10% ( noted to have 35% clonal plasma cells] Normal female karyotype cytogenetics with no other abnormalities SPEP shows M spike of 2.2 (slightly decrease/stabiliyt from previous levels) with IFE showing IgG lambda paraprotein. Associated with some Immunoparesis characterized by associated decrease in IgA and IgM.  M protein 2.6--> 2.6---> 2.2--->2.4--->2.3--> 2.6-->2.4->2.5--> 2.2--->3 -->2.7-->3.9-->4.1-->2.2-->1.4--> 1.1  03/02/18 PET/CT revealed No hypermetabolic osseous lesion on today's study. No hypermetabolic soft tissue disease in the neck, chest, abdomen, pelvis, or lower extremities   03/05/18 cytogenetics report revealed that her plasma cells make up 35% of her cells.    11/27/18 BM Bx revealed slightly hypercellular bone marrow with plasma cell neoplasm and plasma cells comprising about 46% of all cells  11/27/18 PET/CT revealed No focal activity within the axillary appendicular skeleton to suggest active multiple myeloma. 2. No lytic skeletal lesions within the CT portion.   Last genetics revealed translocation 11;14, consistent with  standard risk.    PLAN: -Discussed pt labwork today, 05/01/20; of CBC w/diff and CMP is as follows: all values are WNL except for CO2 at 21, GFR, Est Non Af Am at 59 -Discussed 05/01/20 of MMP is as follows: all values are pending -Discussed 05/01/20 of Kappa/Lamda Light Chains is as follows: pending -Advised on extending remission  -Advised on aggressive maintenance approach- on maintenance Dara + Kyprolis -Advised not doing steroids with maintenance -Advised atrial flutter ablation -Advised on bactrim use  -Advised on getting all new vaccines  -Recommended getting 2nd dose of COVID19 vaccine as scheduled  -Recommended on being cautious around people  -Recommended avoiding closed areas  -Continue with treatment as scheduled and planned at [place] see back as needed    The total time spent in the appt was 20 minutes and more than 50% was on counseling and direct patient cares.  All of the patient's questions were answered with apparent satisfaction. The patient knows to call the clinic  with any problems, questions or concerns.  Sullivan Lone MD [place] AAHIVMS [facility] Hematology/Oncology Physician [place]  (Office):       [phone] (Work cell):  [phone] (Fax):           [phone]  I, Dawayne Cirri am acting as a scribe for Dr. Sullivan Lone.   .I have reviewed the above documentation for accuracy and completeness, and I agree with the above. Brunetta Genera MD

## 2020-05-01 ENCOUNTER — Other Ambulatory Visit: Payer: Self-pay

## 2020-05-01 ENCOUNTER — Inpatient Hospital Stay: Payer: Medicare Other

## 2020-05-01 ENCOUNTER — Inpatient Hospital Stay: Payer: Medicare Other | Attending: Hematology | Admitting: Hematology

## 2020-05-01 VITALS — BP 130/65 | HR 40 | Temp 97.6°F | Resp 20 | Ht 67.0 in | Wt 129.0 lb

## 2020-05-01 DIAGNOSIS — C9002 Multiple myeloma in relapse: Secondary | ICD-10-CM | POA: Insufficient documentation

## 2020-05-01 DIAGNOSIS — Z803 Family history of malignant neoplasm of breast: Secondary | ICD-10-CM | POA: Diagnosis not present

## 2020-05-01 DIAGNOSIS — Z9484 Stem cells transplant status: Secondary | ICD-10-CM | POA: Insufficient documentation

## 2020-05-01 DIAGNOSIS — E785 Hyperlipidemia, unspecified: Secondary | ICD-10-CM | POA: Diagnosis not present

## 2020-05-01 DIAGNOSIS — Z79899 Other long term (current) drug therapy: Secondary | ICD-10-CM | POA: Insufficient documentation

## 2020-05-01 DIAGNOSIS — C9000 Multiple myeloma not having achieved remission: Secondary | ICD-10-CM

## 2020-05-01 DIAGNOSIS — Z87891 Personal history of nicotine dependence: Secondary | ICD-10-CM | POA: Diagnosis not present

## 2020-05-01 DIAGNOSIS — Z8249 Family history of ischemic heart disease and other diseases of the circulatory system: Secondary | ICD-10-CM | POA: Diagnosis not present

## 2020-05-01 DIAGNOSIS — Z7982 Long term (current) use of aspirin: Secondary | ICD-10-CM | POA: Insufficient documentation

## 2020-05-01 LAB — CMP (CANCER CENTER ONLY)
ALT: 28 U/L (ref 0–44)
AST: 21 U/L (ref 15–41)
Albumin: 4 g/dL (ref 3.5–5.0)
Alkaline Phosphatase: 71 U/L (ref 38–126)
Anion gap: 11 (ref 5–15)
BUN: 20 mg/dL (ref 8–23)
CO2: 21 mmol/L — ABNORMAL LOW (ref 22–32)
Calcium: 9.6 mg/dL (ref 8.9–10.3)
Chloride: 108 mmol/L (ref 98–111)
Creatinine: 0.95 mg/dL (ref 0.44–1.00)
GFR, Est AFR Am: >60 mL/min (ref >60)
GFR, Estimated: 59 mL/min — ABNORMAL LOW (ref >60)
Glucose, Bld: 75 mg/dL (ref 70–99)
Potassium: 4.6 mmol/L (ref 3.5–5.1)
Sodium: 140 mmol/L (ref 135–145)
Total Bilirubin: 0.5 mg/dL (ref 0.3–1.2)
Total Protein: 6.6 g/dL (ref 6.5–8.1)

## 2020-05-01 LAB — CBC WITH DIFFERENTIAL/PLATELET
Abs Immature Granulocytes: 0.03 10*3/uL (ref 0.00–0.07)
Basophils Absolute: 0 10*3/uL (ref 0.0–0.1)
Basophils Relative: 0 %
Eosinophils Absolute: 0.2 10*3/uL (ref 0.0–0.5)
Eosinophils Relative: 2 %
HCT: 38 % (ref 36.0–46.0)
Hemoglobin: 13.1 g/dL (ref 12.0–15.0)
Immature Granulocytes: 0 %
Lymphocytes Relative: 21 %
Lymphs Abs: 1.4 10*3/uL (ref 0.7–4.0)
MCH: 33.4 pg (ref 26.0–34.0)
MCHC: 34.5 g/dL (ref 30.0–36.0)
MCV: 96.9 fL (ref 80.0–100.0)
Monocytes Absolute: 0.8 10*3/uL (ref 0.1–1.0)
Monocytes Relative: 12 %
Neutro Abs: 4.3 10*3/uL (ref 1.7–7.7)
Neutrophils Relative %: 65 %
Platelets: 201 10*3/uL (ref 150–400)
RBC: 3.92 MIL/uL (ref 3.87–5.11)
RDW: 11.8 % (ref 11.5–15.5)
WBC: 6.7 10*3/uL (ref 4.0–10.5)
nRBC: 0 % (ref 0.0–0.2)

## 2020-05-03 ENCOUNTER — Encounter: Payer: Self-pay | Admitting: Hematology

## 2020-05-04 LAB — KAPPA/LAMBDA LIGHT CHAINS
Kappa free light chain: 1.5 mg/L — ABNORMAL LOW (ref 3.3–19.4)
Kappa, lambda light chain ratio: >1 (ref 0.26–1.65)
Lambda free light chains: <1.5 mg/L — ABNORMAL LOW (ref 5.7–26.3)

## 2020-05-05 LAB — MULTIPLE MYELOMA PANEL, SERUM
Albumin SerPl Elph-Mcnc: 3.6 g/dL (ref 2.9–4.4)
Albumin/Glob SerPl: 1.4 (ref 0.7–1.7)
Alpha 1: 0.2 g/dL (ref 0.0–0.4)
Alpha2 Glob SerPl Elph-Mcnc: 0.8 g/dL (ref 0.4–1.0)
B-Globulin SerPl Elph-Mcnc: 1 g/dL (ref 0.7–1.3)
Gamma Glob SerPl Elph-Mcnc: 0.5 g/dL (ref 0.4–1.8)
Globulin, Total: 2.6 g/dL (ref 2.2–3.9)
IgA: 5 mg/dL — ABNORMAL LOW (ref 64–422)
IgG (Immunoglobin G), Serum: 376 mg/dL — ABNORMAL LOW (ref 586–1602)
IgM (Immunoglobulin M), Srm: 15 mg/dL — ABNORMAL LOW (ref 26–217)
Total Protein ELP: 6.2 g/dL (ref 6.0–8.5)

## 2020-05-06 DIAGNOSIS — R9431 Abnormal electrocardiogram [ECG] [EKG]: Secondary | ICD-10-CM | POA: Diagnosis not present

## 2020-05-07 DIAGNOSIS — C9000 Multiple myeloma not having achieved remission: Secondary | ICD-10-CM | POA: Diagnosis not present

## 2020-05-15 ENCOUNTER — Encounter: Payer: Self-pay | Admitting: Hematology

## 2020-05-21 DIAGNOSIS — C9000 Multiple myeloma not having achieved remission: Secondary | ICD-10-CM | POA: Diagnosis not present

## 2020-05-21 DIAGNOSIS — Z5112 Encounter for antineoplastic immunotherapy: Secondary | ICD-10-CM | POA: Diagnosis not present

## 2020-05-23 DIAGNOSIS — C9000 Multiple myeloma not having achieved remission: Secondary | ICD-10-CM | POA: Diagnosis not present

## 2020-05-29 DIAGNOSIS — Z7952 Long term (current) use of systemic steroids: Secondary | ICD-10-CM | POA: Diagnosis not present

## 2020-05-29 DIAGNOSIS — C9000 Multiple myeloma not having achieved remission: Secondary | ICD-10-CM | POA: Diagnosis not present

## 2020-05-29 DIAGNOSIS — Z79811 Long term (current) use of aromatase inhibitors: Secondary | ICD-10-CM | POA: Diagnosis not present

## 2020-05-29 DIAGNOSIS — M1990 Unspecified osteoarthritis, unspecified site: Secondary | ICD-10-CM | POA: Diagnosis not present

## 2020-05-31 ENCOUNTER — Encounter: Payer: Self-pay | Admitting: Hematology

## 2020-06-03 ENCOUNTER — Encounter: Payer: Self-pay | Admitting: Orthopaedic Surgery

## 2020-06-04 DIAGNOSIS — C9000 Multiple myeloma not having achieved remission: Secondary | ICD-10-CM | POA: Diagnosis not present

## 2020-06-04 DIAGNOSIS — Z5112 Encounter for antineoplastic immunotherapy: Secondary | ICD-10-CM | POA: Diagnosis not present

## 2020-06-16 DIAGNOSIS — Z8679 Personal history of other diseases of the circulatory system: Secondary | ICD-10-CM | POA: Diagnosis not present

## 2020-06-16 DIAGNOSIS — Z9049 Acquired absence of other specified parts of digestive tract: Secondary | ICD-10-CM | POA: Diagnosis not present

## 2020-06-16 DIAGNOSIS — Z96661 Presence of right artificial ankle joint: Secondary | ICD-10-CM | POA: Diagnosis not present

## 2020-06-16 DIAGNOSIS — I471 Supraventricular tachycardia: Secondary | ICD-10-CM | POA: Diagnosis not present

## 2020-06-16 DIAGNOSIS — I4510 Unspecified right bundle-branch block: Secondary | ICD-10-CM | POA: Diagnosis not present

## 2020-06-16 DIAGNOSIS — Z7901 Long term (current) use of anticoagulants: Secondary | ICD-10-CM | POA: Diagnosis not present

## 2020-06-16 DIAGNOSIS — Z48812 Encounter for surgical aftercare following surgery on the circulatory system: Secondary | ICD-10-CM | POA: Diagnosis not present

## 2020-06-16 DIAGNOSIS — Z5181 Encounter for therapeutic drug level monitoring: Secondary | ICD-10-CM | POA: Diagnosis not present

## 2020-06-16 DIAGNOSIS — C9001 Multiple myeloma in remission: Secondary | ICD-10-CM | POA: Diagnosis not present

## 2020-06-16 DIAGNOSIS — I480 Paroxysmal atrial fibrillation: Secondary | ICD-10-CM | POA: Diagnosis not present

## 2020-06-16 DIAGNOSIS — Z9889 Other specified postprocedural states: Secondary | ICD-10-CM | POA: Diagnosis not present

## 2020-06-16 DIAGNOSIS — C9000 Multiple myeloma not having achieved remission: Secondary | ICD-10-CM | POA: Diagnosis not present

## 2020-06-16 DIAGNOSIS — Z9484 Stem cells transplant status: Secondary | ICD-10-CM | POA: Diagnosis not present

## 2020-06-16 DIAGNOSIS — Z79899 Other long term (current) drug therapy: Secondary | ICD-10-CM | POA: Diagnosis not present

## 2020-06-16 DIAGNOSIS — Z23 Encounter for immunization: Secondary | ICD-10-CM | POA: Diagnosis not present

## 2020-06-16 DIAGNOSIS — I4892 Unspecified atrial flutter: Secondary | ICD-10-CM | POA: Diagnosis not present

## 2020-06-16 DIAGNOSIS — I484 Atypical atrial flutter: Secondary | ICD-10-CM | POA: Diagnosis not present

## 2020-06-18 DIAGNOSIS — Z5112 Encounter for antineoplastic immunotherapy: Secondary | ICD-10-CM | POA: Diagnosis not present

## 2020-06-18 DIAGNOSIS — C9000 Multiple myeloma not having achieved remission: Secondary | ICD-10-CM | POA: Diagnosis not present

## 2020-06-18 DIAGNOSIS — I4510 Unspecified right bundle-branch block: Secondary | ICD-10-CM | POA: Diagnosis not present

## 2020-06-19 DIAGNOSIS — H11003 Unspecified pterygium of eye, bilateral: Secondary | ICD-10-CM | POA: Diagnosis not present

## 2020-06-22 DIAGNOSIS — Z23 Encounter for immunization: Secondary | ICD-10-CM | POA: Diagnosis not present

## 2020-06-23 DIAGNOSIS — C9000 Multiple myeloma not having achieved remission: Secondary | ICD-10-CM | POA: Diagnosis not present

## 2020-06-29 DIAGNOSIS — Z452 Encounter for adjustment and management of vascular access device: Secondary | ICD-10-CM | POA: Diagnosis not present

## 2020-07-01 DIAGNOSIS — Z7901 Long term (current) use of anticoagulants: Secondary | ICD-10-CM | POA: Diagnosis not present

## 2020-07-01 DIAGNOSIS — Z452 Encounter for adjustment and management of vascular access device: Secondary | ICD-10-CM | POA: Diagnosis not present

## 2020-07-01 DIAGNOSIS — C9000 Multiple myeloma not having achieved remission: Secondary | ICD-10-CM | POA: Diagnosis not present

## 2020-07-01 DIAGNOSIS — I4891 Unspecified atrial fibrillation: Secondary | ICD-10-CM | POA: Diagnosis not present

## 2020-07-02 ENCOUNTER — Encounter: Payer: Self-pay | Admitting: Hematology

## 2020-07-02 DIAGNOSIS — Z5112 Encounter for antineoplastic immunotherapy: Secondary | ICD-10-CM | POA: Diagnosis not present

## 2020-07-02 DIAGNOSIS — C9000 Multiple myeloma not having achieved remission: Secondary | ICD-10-CM | POA: Diagnosis not present

## 2020-07-09 DIAGNOSIS — N816 Rectocele: Secondary | ICD-10-CM | POA: Diagnosis not present

## 2020-07-16 DIAGNOSIS — Z5112 Encounter for antineoplastic immunotherapy: Secondary | ICD-10-CM | POA: Diagnosis not present

## 2020-07-16 DIAGNOSIS — C9000 Multiple myeloma not having achieved remission: Secondary | ICD-10-CM | POA: Diagnosis not present

## 2020-07-20 DIAGNOSIS — Z23 Encounter for immunization: Secondary | ICD-10-CM | POA: Diagnosis not present

## 2020-07-24 DIAGNOSIS — C9000 Multiple myeloma not having achieved remission: Secondary | ICD-10-CM | POA: Diagnosis not present

## 2020-07-30 DIAGNOSIS — C9000 Multiple myeloma not having achieved remission: Secondary | ICD-10-CM | POA: Diagnosis not present

## 2020-07-30 DIAGNOSIS — Z5112 Encounter for antineoplastic immunotherapy: Secondary | ICD-10-CM | POA: Diagnosis not present

## 2020-08-12 ENCOUNTER — Other Ambulatory Visit: Payer: Self-pay

## 2020-08-12 ENCOUNTER — Ambulatory Visit: Payer: Self-pay

## 2020-08-12 ENCOUNTER — Ambulatory Visit (INDEPENDENT_AMBULATORY_CARE_PROVIDER_SITE_OTHER): Payer: Medicare Other | Admitting: Orthopaedic Surgery

## 2020-08-12 ENCOUNTER — Encounter: Payer: Self-pay | Admitting: Orthopaedic Surgery

## 2020-08-12 VITALS — Ht 67.0 in | Wt 128.0 lb

## 2020-08-12 DIAGNOSIS — Z96651 Presence of right artificial knee joint: Secondary | ICD-10-CM

## 2020-08-12 DIAGNOSIS — M25561 Pain in right knee: Secondary | ICD-10-CM | POA: Diagnosis not present

## 2020-08-12 DIAGNOSIS — G8929 Other chronic pain: Secondary | ICD-10-CM

## 2020-08-12 DIAGNOSIS — M1711 Unilateral primary osteoarthritis, right knee: Secondary | ICD-10-CM | POA: Diagnosis not present

## 2020-08-12 MED ORDER — BUPIVACAINE HCL 0.5 % IJ SOLN
2.0000 mL | INTRAMUSCULAR | Status: AC | PRN
  Administered 2020-08-12: 2 mL via INTRA_ARTICULAR

## 2020-08-12 MED ORDER — METHYLPREDNISOLONE ACETATE 40 MG/ML IJ SUSP
40.0000 mg | INTRAMUSCULAR | Status: AC | PRN
Start: 2020-08-12 — End: 2020-08-12
  Administered 2020-08-12: 40 mg via INTRA_ARTICULAR

## 2020-08-12 MED ORDER — LIDOCAINE HCL 1 % IJ SOLN
2.0000 mL | INTRAMUSCULAR | Status: AC | PRN
  Administered 2020-08-12: 2 mL

## 2020-08-12 NOTE — Progress Notes (Signed)
Office Visit Note   Patient: Jennifer [name]           Date of Birth: [birth year]           MRN: [id number] Visit Date: 08/12/2020              Requested by: Jennifer Gearing, MD No address on file PCP: Jennifer Gearing, MD   Assessment & Plan: Visit Diagnoses:  1. Chronic pain of right knee   2. Primary osteoarthritis of right knee   3. History of total right knee replacement     Plan: Jennifer [name] has developed some crepitation on the lateral aspect of her right total knee replacement over the last several weeks.  She is back playing pickle ball after receiving chemotherapy and bone marrow transplant for her multiple myeloma.  She looks great.  X-rays look look just fine and that there is an area of ectopic calcification along the lateral tibial plateau juxtaposed to the tibia.  Going to inject that area with cortisone and hope that it makes a difference.  Follow-Up Instructions: Return if symptoms worsen or fail to improve.   Orders:  Orders Placed This Encounter  Procedures  . Large Joint Inj: R knee  . XR KNEE 3 VIEW RIGHT   No orders of the defined types were placed in this encounter.     Procedures: Large Joint Inj: R knee on 08/12/2020 3:33 PM Indications: pain and diagnostic evaluation Details: 25 G 1.5 in needle, lateral approach  Arthrogram: No  Medications: 2 mL lidocaine 1 %; 2 mL bupivacaine 0.5 %; 40 mg methylPREDNISolone acetate 40 MG/ML Procedure, treatment alternatives, risks and benefits explained, specific risks discussed. Consent was given by the patient. Immediately prior to procedure a time out was called to verify the correct patient, procedure, equipment, support staff and site/side marked as required. Patient was prepped and draped in the usual sterile fashion.       Clinical Data: No additional findings.   Subjective: Chief Complaint  Patient presents with  . Right Knee - Pain  Patient presents today for her right knee. She has been having pain in  her right knee for about 4 months. No known injury. Her pain is mainly located medially, and only occurs with walking. She enjoys being active and playing pickleball. She is not taking anything for pain. She is now almost two years out from a right total knee arthroplasty. Has history of multiple myeloma with recurrence and earlier in the year went through a bone marrow transplant through [facility] and is now doing quite well and back to her "normal self" she is playing pickle ball and does develop some pain along the lateral aspect of her knee along with some crepitation.  No injury or trauma  HPI  Review of Systems   Objective: Vital Signs: Ht 5' 7" (1.702 m)   Wt 128 lb (58.1 kg)   LMP 11/07/1996   BMI 20.05 kg/m   Physical Exam Constitutional:      Appearance: She is well-developed.  Eyes:     Pupils: Pupils are equal, round, and reactive to light.  Pulmonary:     Effort: Pulmonary effort is normal.  Skin:    General: Skin is warm and dry.  Neurological:     Mental Status: She is alert and oriented to person, place, and time.  Psychiatric:        Behavior: Behavior normal.     Ortho Exam right knee was not hot red  warm swollen.  No instability with a varus valgus stress and negative anterior drawer sign.  Minimal effusion.  Little bit of tenderness and crepitation along the lateral joint line with flexion and extension.  No skin changes.  Not sure if this area corresponds to the area of ectopic calcification on the film  Specialty Comments:  No specialty comments available.  Imaging: XR KNEE 3 VIEW RIGHT  Result Date: 08/12/2020 Films of the right total knee replacement revealed good position of the components.  No obvious loosening.  There is an area of ectopic calcification along the lateral tibial plateau which may or may not have any clinical significance    PMFS History: Patient Active Problem List   Diagnosis Date Noted  . Chronic left shoulder pain  11/14/2018  . History of total right knee replacement 10/17/2018  . Osteoarthritis of right knee 09/18/2018  . Primary osteoarthritis of both knees 11/30/2017  . Primary osteoarthritis of both feet 11/30/2017  . History of rotator cuff tear repair, bilateral 11/30/2017  . Primary osteoarthritis of right knee 08/23/2017  . Vitamin D deficiency 12/27/2015  . Smoldering multiple myeloma ([place]) 12/22/2015  . Hypergammaglobulinemia   . IgG monoclonal gammopathy of uncertain significance   . Abnormal gamma globulin level 04/14/2015  . Hyperlipidemia 04/03/2015  . Hyperglycemia 04/03/2015  . Diverticulosis of colon without hemorrhage 04/03/2015  . Arthralgia of multiple joints 04/03/2015  . Primary osteoarthritis of both hands 01/20/2010  . SKIN CANCER, HX OF 01/20/2010   Past Medical History:  Diagnosis Date  . Atrial fibrillation ([place]) 12/2010   PMH of ; [place] , [place] [facility]  . DJD (degenerative joint disease)   . History of blood transfusion 0881   complication after childbirth  . Osteoarthritis    "knees, hands, fingers, toes" (09/18/2018)  . PONV (postoperative nausea and vomiting) 09/2018   nausea only with knee surgery  . Skin cancer of face 2015   Patient reports this was likely basal cell on the right side of her face needing 14 stitches.  . Smoldering multiple myeloma ([place]) 2016   "pre bone cancer; being monitored for this q 4 months or so" (09/18/2018)  . Squamous cell carcinoma of scalp 2000   S/P MOHS    Family History  Problem Relation Age of Onset  . Atrial fibrillation Father   . Asthma Brother   . Alzheimer's disease Mother   . Myasthenia gravis Mother        ocular  . Breast cancer Paternal Grandmother   . Depression Daughter   . Colon cancer Neg Hx   . Rectal cancer Neg Hx   . Stomach cancer Neg Hx   . Diabetes Neg Hx   . Stroke Neg Hx   . Heart attack Neg Hx     Past Surgical History:  Procedure Laterality Date  . CATARACT EXTRACTION W/ INTRAOCULAR LENS   IMPLANT, BILATERAL Bilateral ~ 2015  . CHOLECYSTECTOMY OPEN  1990's  . COLONOSCOPY  02/2013   negative X 3; Dr Olevia Perches  . G 3 P 1    . HAMMER TOE SURGERY Left 2018   2nd digit  . JOINT REPLACEMENT    . MOHS SURGERY  ~ 2009   "back of my head"  . SHOULDER ARTHROSCOPY W/ ROTATOR CUFF REPAIR Right 2012    Dr Durward Fortes  . SHOULDER ARTHROSCOPY W/ ROTATOR CUFF REPAIR Left 06/2014   Dr. Sydnee Cabal for rotator cuff  . TONSILLECTOMY AND ADENOIDECTOMY    . TOTAL KNEE ARTHROPLASTY  Right 09/18/2018  . TOTAL KNEE ARTHROPLASTY Right 09/18/2018   Procedure: RIGHT TOTAL KNEE ARTHROPLASTY;  Surgeon: Garald Balding, MD;  Location: [facility];  Service: Orthopedics;  Laterality: Right;  . WISDOM TOOTH EXTRACTION  1960s   Social History   Occupational History  . Not on file  Tobacco Use  . Smoking status: Former Smoker    Packs/day: 0.25    Years: 9.00    Pack years: 2.25    Quit date: 11/07/1970    Years since quitting: 49.7  . Smokeless tobacco: Never Used  . Tobacco comment: 1/4 ppd [id number]  Vaping Use  . Vaping Use: Never used  Substance and Sexual Activity  . Alcohol use: Yes    Alcohol/week: 3.0 standard drinks    Types: 3 Glasses of wine per week  . Drug use: Never  . Sexual activity: Not Currently    Partners: Male

## 2020-08-13 DIAGNOSIS — Z5112 Encounter for antineoplastic immunotherapy: Secondary | ICD-10-CM | POA: Diagnosis not present

## 2020-08-13 DIAGNOSIS — C9000 Multiple myeloma not having achieved remission: Secondary | ICD-10-CM | POA: Diagnosis not present

## 2020-08-20 DIAGNOSIS — C9000 Multiple myeloma not having achieved remission: Secondary | ICD-10-CM | POA: Diagnosis not present

## 2020-08-27 DIAGNOSIS — Z5112 Encounter for antineoplastic immunotherapy: Secondary | ICD-10-CM | POA: Diagnosis not present

## 2020-08-27 DIAGNOSIS — C9000 Multiple myeloma not having achieved remission: Secondary | ICD-10-CM | POA: Diagnosis not present

## 2020-09-10 DIAGNOSIS — Z5112 Encounter for antineoplastic immunotherapy: Secondary | ICD-10-CM | POA: Diagnosis not present

## 2020-09-10 DIAGNOSIS — C9000 Multiple myeloma not having achieved remission: Secondary | ICD-10-CM | POA: Diagnosis not present

## 2020-09-16 DIAGNOSIS — Z9484 Stem cells transplant status: Secondary | ICD-10-CM | POA: Diagnosis not present

## 2020-09-16 DIAGNOSIS — D801 Nonfamilial hypogammaglobulinemia: Secondary | ICD-10-CM | POA: Diagnosis not present

## 2020-09-16 DIAGNOSIS — C9000 Multiple myeloma not having achieved remission: Secondary | ICD-10-CM | POA: Diagnosis not present

## 2020-09-16 DIAGNOSIS — C9001 Multiple myeloma in remission: Secondary | ICD-10-CM | POA: Diagnosis not present

## 2020-09-16 DIAGNOSIS — Z7983 Long term (current) use of bisphosphonates: Secondary | ICD-10-CM | POA: Diagnosis not present

## 2020-09-24 DIAGNOSIS — Z5112 Encounter for antineoplastic immunotherapy: Secondary | ICD-10-CM | POA: Diagnosis not present

## 2020-09-24 DIAGNOSIS — D801 Nonfamilial hypogammaglobulinemia: Secondary | ICD-10-CM | POA: Diagnosis not present

## 2020-09-24 DIAGNOSIS — C9000 Multiple myeloma not having achieved remission: Secondary | ICD-10-CM | POA: Diagnosis not present

## 2020-10-02 IMAGING — CR DG CHEST 2V
2 series · 2 of 2 positions shown · non-contrast
Comparison: CT 03/01/2018.

CLINICAL DATA: Total right knee replacement.

EXAM:
CHEST - 2 VIEW

[w chest pa]
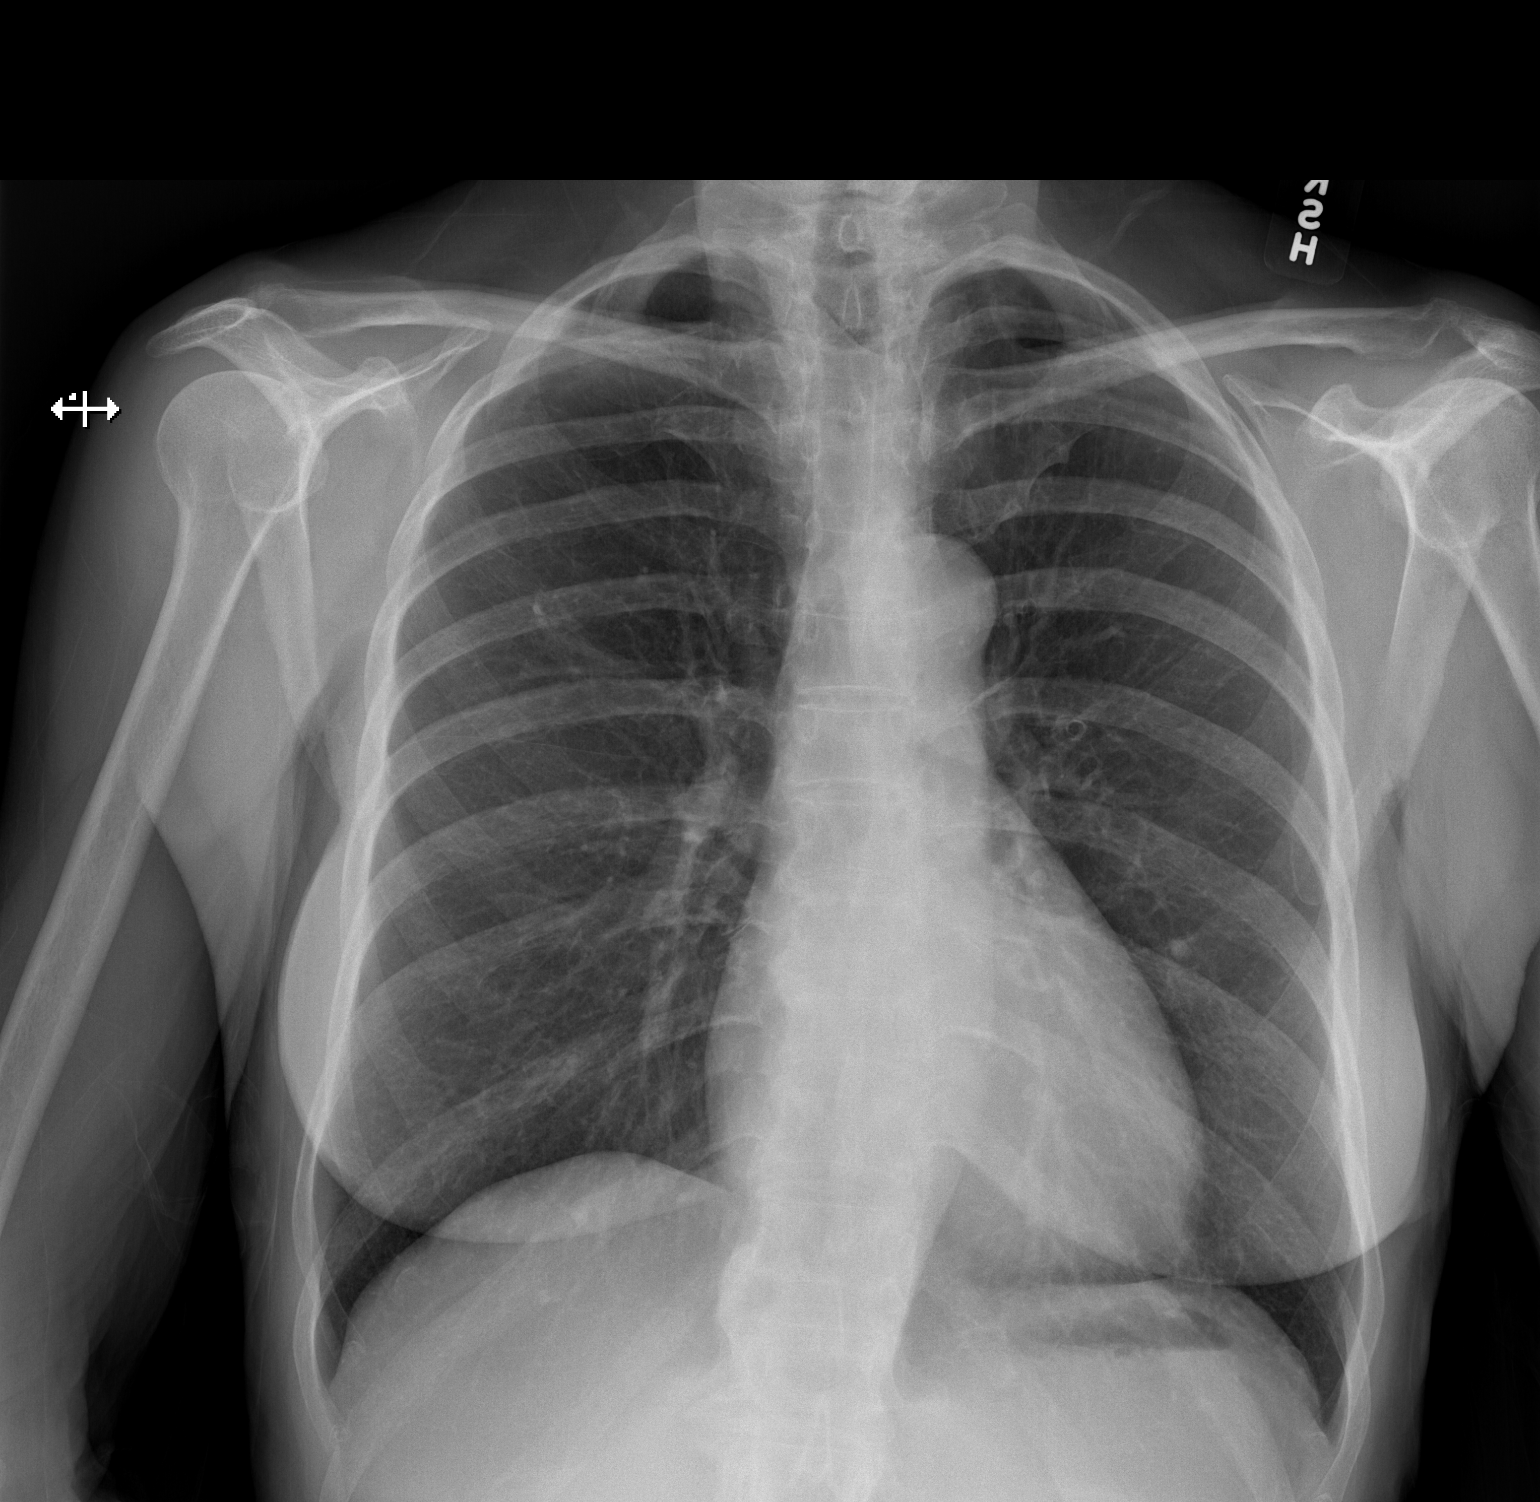

[w chest lat]
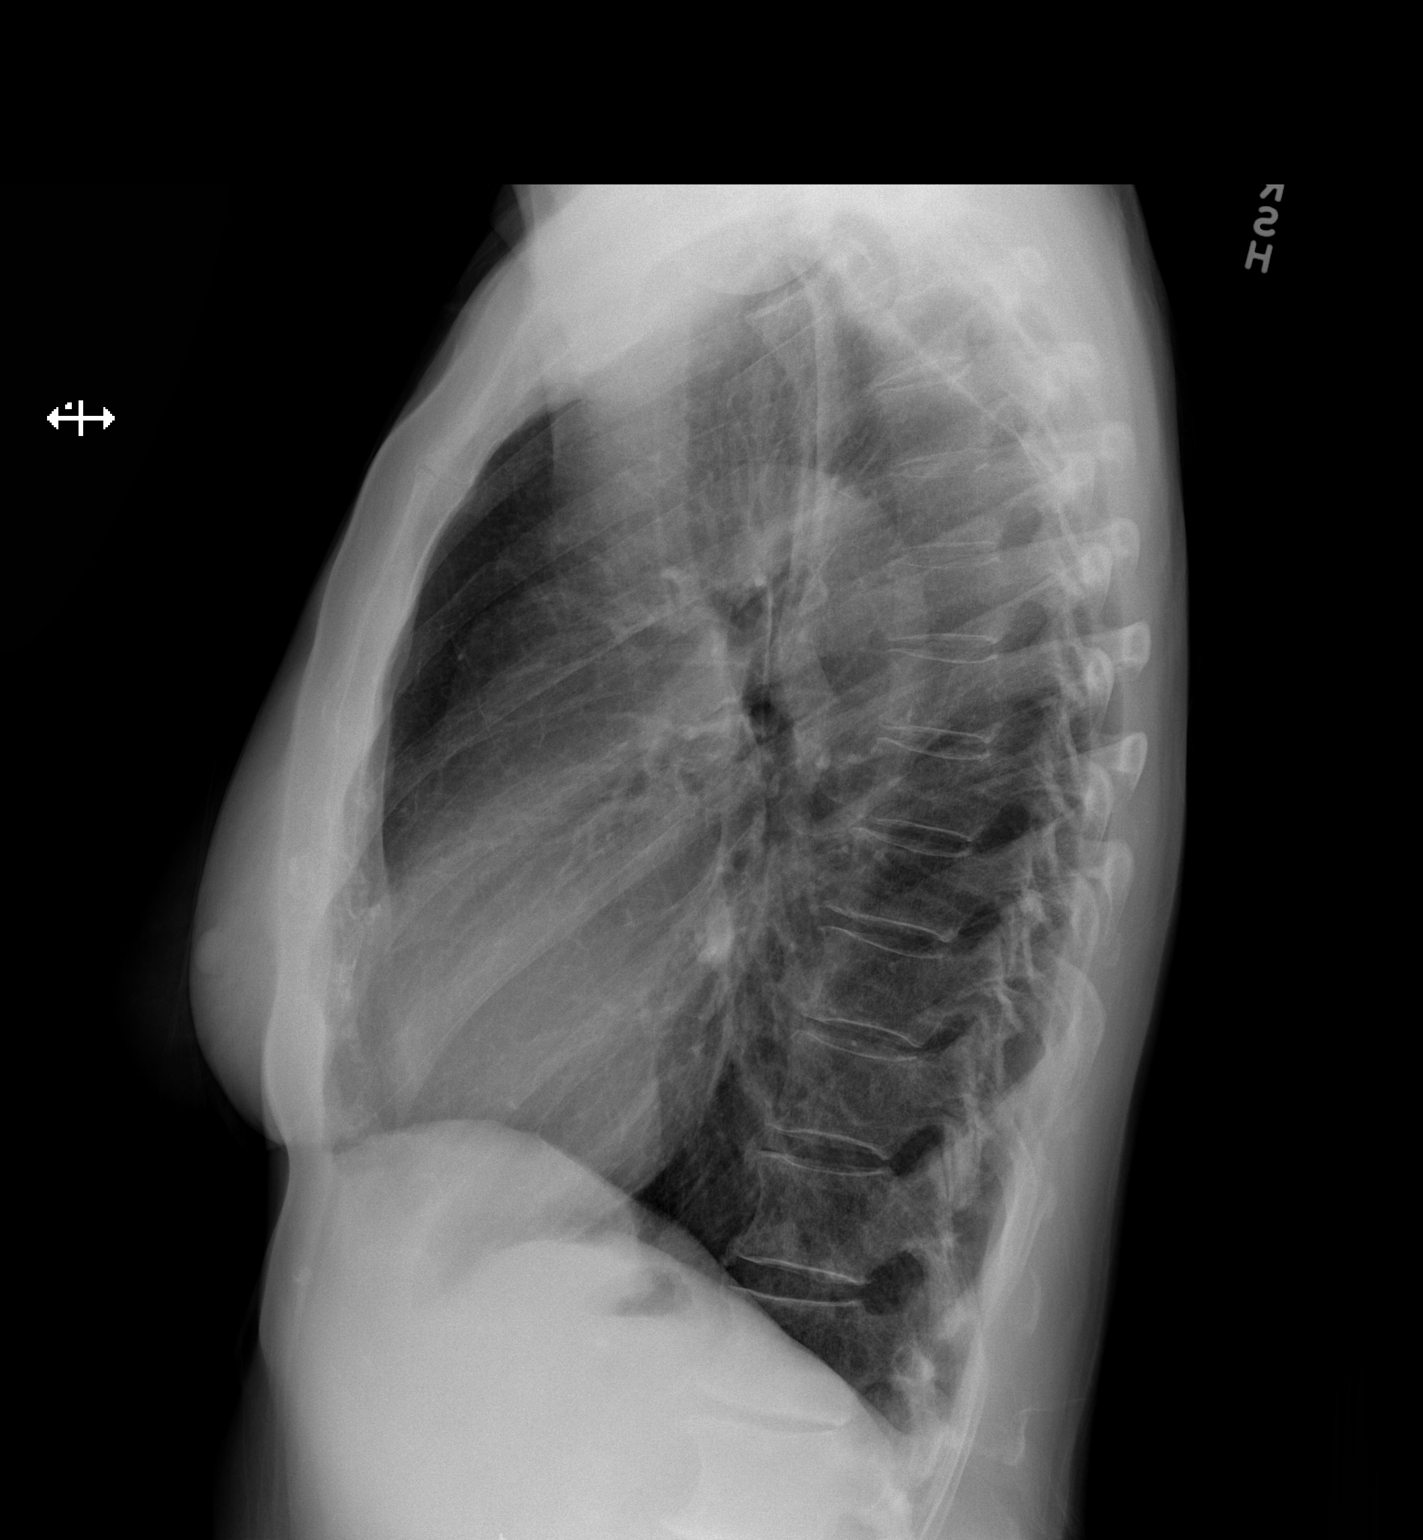

[2 of 2 positions shown; findings below may reference images not displayed]

FINDINGS: Mediastinum hilar structures normal. Lungs are clear. No focal
infiltrate. No pleural effusion or pneumothorax. Calcified pulmonary
nodule in the left mid lung field consistent granuloma.
Thoracolumbar spine scoliosis and degenerative change.
IMPRESSION: No acute cardiopulmonary disease.

## 2020-10-08 DIAGNOSIS — Z5112 Encounter for antineoplastic immunotherapy: Secondary | ICD-10-CM | POA: Diagnosis not present

## 2020-10-08 DIAGNOSIS — C9000 Multiple myeloma not having achieved remission: Secondary | ICD-10-CM | POA: Diagnosis not present

## 2020-10-14 DIAGNOSIS — I4510 Unspecified right bundle-branch block: Secondary | ICD-10-CM | POA: Diagnosis not present

## 2020-10-14 DIAGNOSIS — I484 Atypical atrial flutter: Secondary | ICD-10-CM | POA: Diagnosis not present

## 2020-10-22 DIAGNOSIS — Z5112 Encounter for antineoplastic immunotherapy: Secondary | ICD-10-CM | POA: Diagnosis not present

## 2020-10-22 DIAGNOSIS — C9000 Multiple myeloma not having achieved remission: Secondary | ICD-10-CM | POA: Diagnosis not present

## 2020-11-05 DIAGNOSIS — C9000 Multiple myeloma not having achieved remission: Secondary | ICD-10-CM | POA: Diagnosis not present

## 2020-11-05 DIAGNOSIS — Z5112 Encounter for antineoplastic immunotherapy: Secondary | ICD-10-CM | POA: Diagnosis not present

## 2020-11-05 DIAGNOSIS — D801 Nonfamilial hypogammaglobulinemia: Secondary | ICD-10-CM | POA: Diagnosis not present

## 2020-11-25 ENCOUNTER — Ambulatory Visit: Payer: Medicare Other | Admitting: Orthopaedic Surgery

## 2020-12-02 ENCOUNTER — Ambulatory Visit (INDEPENDENT_AMBULATORY_CARE_PROVIDER_SITE_OTHER): Payer: Medicare Other | Admitting: Orthopaedic Surgery

## 2020-12-02 ENCOUNTER — Encounter: Payer: Self-pay | Admitting: Orthopaedic Surgery

## 2020-12-02 ENCOUNTER — Other Ambulatory Visit: Payer: Self-pay

## 2020-12-02 VITALS — Ht 66.0 in | Wt 126.0 lb

## 2020-12-02 DIAGNOSIS — Z96651 Presence of right artificial knee joint: Secondary | ICD-10-CM

## 2020-12-02 DIAGNOSIS — M1711 Unilateral primary osteoarthritis, right knee: Secondary | ICD-10-CM

## 2020-12-02 NOTE — Progress Notes (Signed)
Office Visit Note   Patient: Jennifer [name]           Date of Birth: 1946/10/27           MRN: [id number] Visit Date: 12/02/2020              Requested by: [name], MD No address on file PCP: [name], MD   Assessment & Plan: Visit Diagnoses:  1. Primary osteoarthritis of right knee   2. History of total right knee replacement     Plan: Jennifer [name] was seen about 3 months ago for evaluation of a painful right total knee replacement.  She had had some discomfort for short period of time with her discomfort localized along the lateral joint.  I injected that with Depo-Medrol and made a big difference.  She notes that she is really not having any pain and does not have any loss of function but does have some effusion.  I did not think it was significant based on her exam.  She had full extension of well over 105 degrees of flexion.  She does open little bit laterally with a varus stress but not to the point where she is uncomfortable.  I did not think it is worth aspirating the knee or injecting the knee with cortisone.  I just suggested she continue with her strengthening exercises.  If she does develop some pain I like to see her back  Follow-Up Instructions: Return if symptoms worsen or fail to improve.   Orders:  No orders of the defined types were placed in this encounter.  No orders of the defined types were placed in this encounter.     Procedures: No procedures performed   Clinical Data: No additional findings.   Subjective: Chief Complaint  Patient presents with  . Right Knee - Pain    09/18/2018 Right TKA  . Left Hip - Pain  . Right Hip - Pain  Patient presents with ongoing right knee pain. She states that it has started to "crack" and "pop".  She does not have terrible pain, but "want it to feel better".  She plays pickle ball and is very active. She had previous right knee injection on 08/12/2020.  She also complains with some bilateral posterior hip pain.  She takes aleve prn with some relief. Presently in remission with her multiple myeloma having had a stem cell transplant  HPI  Review of Systems   Objective: Vital Signs: Ht $RemoveB'5\' 6"'qMdLhFmC$  (1.676 m)   Wt 126 lb (57.2 kg)   LMP 11/07/1996   BMI 20.34 kg/m   Physical Exam Constitutional:      Appearance: She is well-developed and well-nourished.  HENT:     Mouth/Throat:     Mouth: Oropharynx is clear and moist.  Eyes:     Extraocular Movements: EOM normal.     Pupils: Pupils are equal, round, and reactive to light.  Pulmonary:     Effort: Pulmonary effort is normal.  Skin:    General: Skin is warm and dry.  Neurological:     Mental Status: She is alert and oriented to person, place, and time.  Psychiatric:        Mood and Affect: Mood and affect normal.        Behavior: Behavior normal.     Ortho Exam right knee was not hot warm or red.  There was an effusion but no localized areas of tenderness.  Full extension flexion over 105 degrees without instability.  Does open a little bit laterally with a varus stress but no opening medially with a valgus stress.  No pain with that maneuver.   Specialty Comments:  No specialty comments available.  Imaging: No results found.   PMFS History: Patient Active Problem List   Diagnosis Date Noted  . Chronic left shoulder pain 11/14/2018  . History of total right knee replacement 10/17/2018  . Osteoarthritis of right knee 09/18/2018  . Primary osteoarthritis of both knees 11/30/2017  . Primary osteoarthritis of both feet 11/30/2017  . History of rotator cuff tear repair, bilateral 11/30/2017  . Primary osteoarthritis of right knee 08/23/2017  . Vitamin D deficiency 12/27/2015  . 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 Hypergammaglobulinemia   . IgG monoclonal gammopathy of uncertain significance   . Abnormal gamma globulin level 04/14/2015  . Hyperlipidemia 04/03/2015  . Hyperglycemia 04/03/2015  . Diverticulosis of colon  without hemorrhage 04/03/2015  . Arthralgia of multiple joints 04/03/2015  . Primary osteoarthritis of both hands 01/20/2010  . SKIN CANCER, HX OF 01/20/2010   Past Medical History:  Diagnosis Date  . Atrial fibrillation ([facility]) 12/2010   PMH of ; [place] , [place] [facility]  . DJD (degenerative joint disease)   . History of blood transfusion 1980   complication after childbirth  . Osteoarthritis    "knees, hands, fingers, toes" (09/18/2018)  . PONV (postoperative nausea and vomiting) 09/2018   nausea only with knee surgery  . Skin cancer of face 2015   Patient reports this was likely basal cell on the right [name] of her face needing 14 stitches.  . 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 Squamous cell carcinoma of scalp 2000   S/P MOHS    Family History  Problem Relation Age of Onset  . Atrial fibrillation Father   . Asthma Brother   . Alzheimer's disease Mother   . Myasthenia gravis Mother        ocular  . Breast cancer Paternal Grandmother   . Depression Daughter   . Colon cancer Neg Hx   . Rectal cancer Neg Hx   . Stomach cancer Neg Hx   . Diabetes Neg Hx   . Stroke Neg Hx   . Heart attack Neg Hx     Past Surgical History:  Procedure Laterality Date  . CATARACT EXTRACTION W/ INTRAOCULAR LENS  IMPLANT, BILATERAL Bilateral ~ 2015  . CHOLECYSTECTOMY OPEN  1990's  . COLONOSCOPY  02/2013   negative X 3; Dr Juanda Chance  . G 3 P 1    . HAMMER TOE SURGERY Left 2018   2nd digit  . JOINT REPLACEMENT    . MOHS SURGERY  ~ 2009   "back of my head"  . SHOULDER ARTHROSCOPY W/ ROTATOR CUFF REPAIR Right 2012    Dr Cleophas Dunker  . SHOULDER ARTHROSCOPY W/ ROTATOR CUFF REPAIR Left 06/2014   Dr. Olena Heckle for rotator cuff  . TONSILLECTOMY AND ADENOIDECTOMY    . TOTAL KNEE ARTHROPLASTY Right 09/18/2018  . TOTAL KNEE ARTHROPLASTY Right 09/18/2018   Procedure: RIGHT TOTAL KNEE ARTHROPLASTY;  Surgeon: Valeria Batman, MD;   Location: [facility] OR;  Service: Orthopedics;  Laterality: Right;  . WISDOM TOOTH EXTRACTION  1960s   Social History   Occupational History  . Not on file  Tobacco Use  . Smoking status: Former Smoker    Packs/day: 0.25    Years: 9.00    Pack  years: 2.25    Quit date: 11/07/1970    Years since quitting: 50.1  . Smokeless tobacco: Never Used  . Tobacco comment: 1/4 ppd [id number]  Vaping Use  . Vaping Use: Never used  Substance and Sexual Activity  . Alcohol use: Yes    Alcohol/week: 3.0 standard drinks    Types: 3 Glasses of wine per week  . Drug use: Never  . Sexual activity: Not Currently    Partners: Male

## 2020-12-24 IMAGING — CT NM PET IMAGE RESTAGE (PS) WHOLE BODY
8 series · 25 of 25 positions shown · non-contrast
Comparison: FDG PET scan 03/02/2018

CLINICAL DATA: Initial treatment strategy for multiple myeloma.

EXAM:
NUCLEAR MEDICINE PET WHOLE BODY
TECHNIQUE: 6.3 mCi F-18 FDG was injected intravenously. Full-ring PET imaging
was performed from the skull base to thigh after the radiotracer. CT
data was obtained and used for attenuation correction and anatomic
localization.
Fasting blood glucose: Three 83 mg/dl

[Series 3: pet wb ac · axial · 5.0mm · 4.07mm/px · z∈[-646,+1150]mm · 5 of 450 slices shown]
[im 1/450]
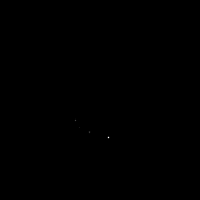
[im 113/450]
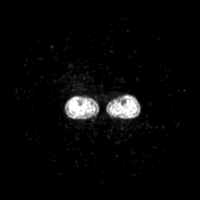
[im 225/450]
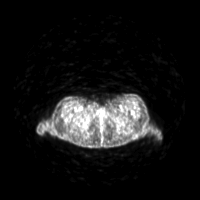
[im 337/450]
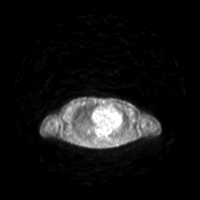
[im 450/450]
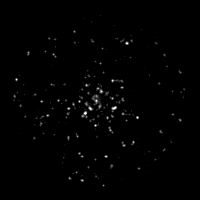

[Series 4: ct wb 5.0 hd_fov · axial · 5.0mm · 1.52mm/px · z∈[-646,+1150]mm · 5 of 450 slices shown]
[im 1/450]
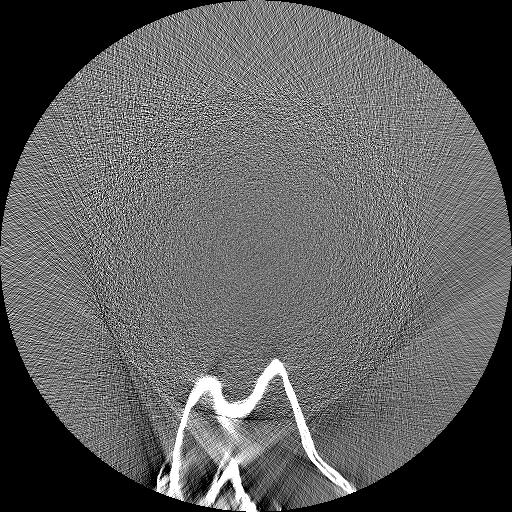
[im 113/450  soft-tissue]
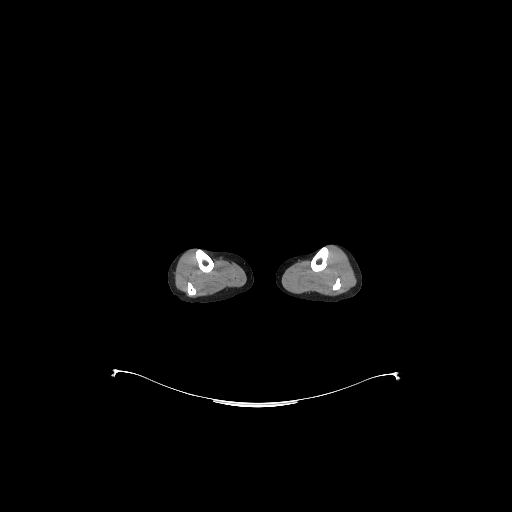
[im 225/450  soft-tissue]
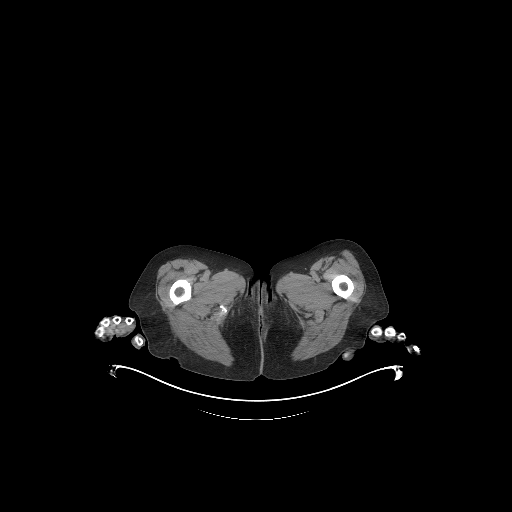
[im 337/450  soft-tissue]
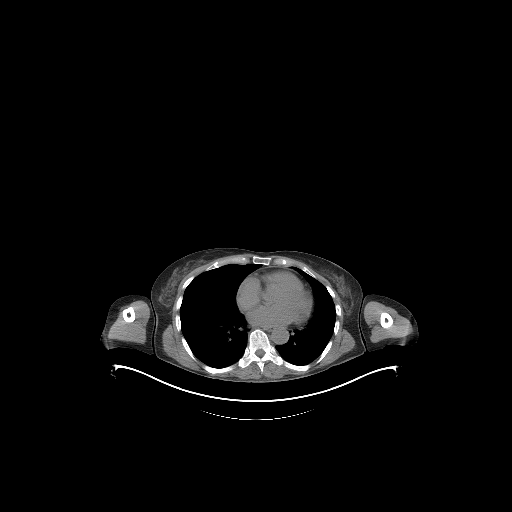
[im 450/450  soft-tissue]
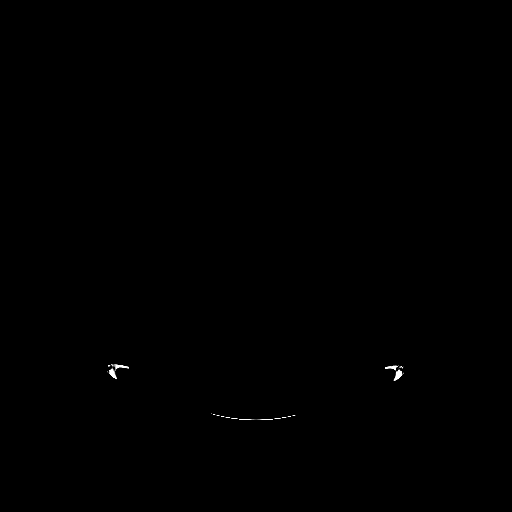

[Series 5: pet wb nac · axial · 5.0mm · 4.07mm/px · z∈[-646,+1150]mm · 6 of 450 slices shown]
[im 1/450  full-range]
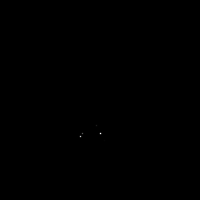
[im 90/450]
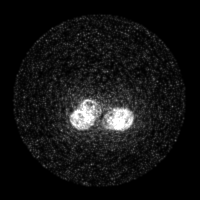
[im 180/450]
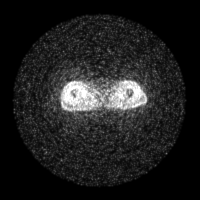
[im 270/450]
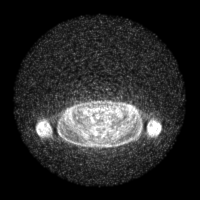
[im 360/450]
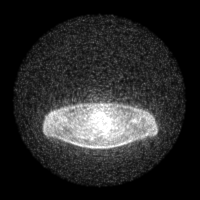
[im 450/450]
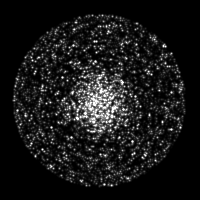

[Series 8: ct wb 5.0 b70f (id)_bone · axial · 5.0mm · 0.69mm/px · 1 of 78 slices shown]
[im 1/78  soft-tissue]
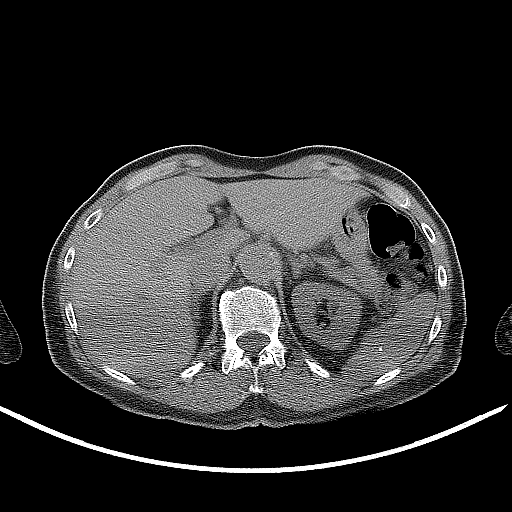

[Series 603: range-ct wb 5.0 hd_fov-cor-<alpha range> · 1 of 82 slices shown]
[im 1/82]
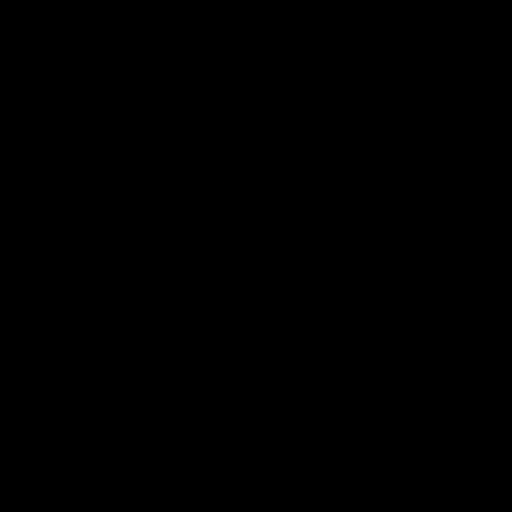

[Series 604: mip range · coronal · 3.72mm/px · 1 of 32 slices shown]
[im 1/32]
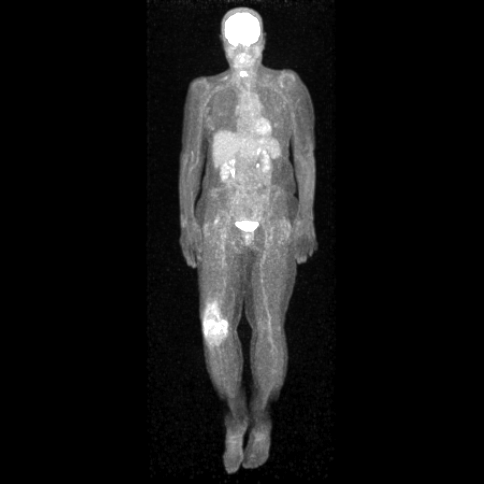

[Series 605: range-ct wb 5.0 hd_fov-tra-<alpha range> · 5 of 428 slices shown]
[im 1/428]
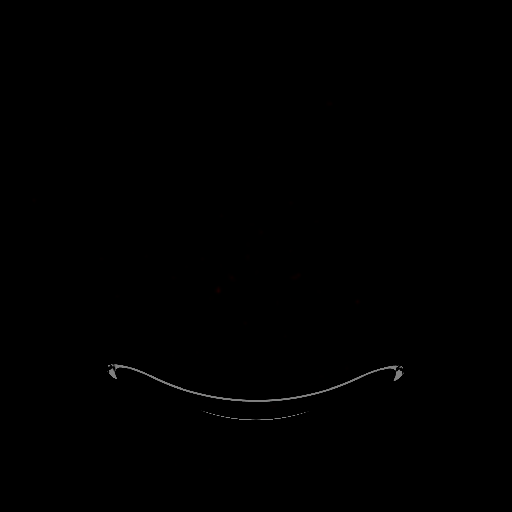
[im 107/428]
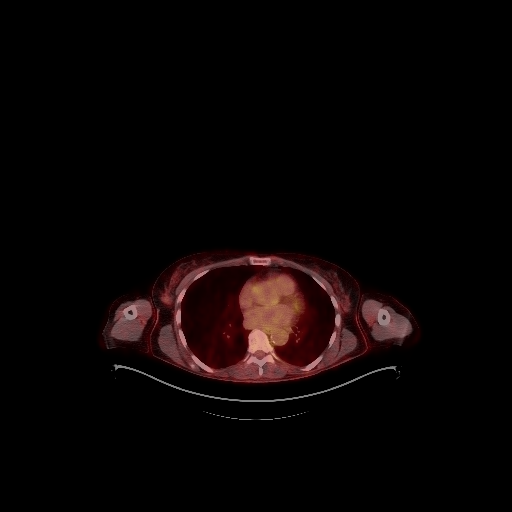
[im 214/428]
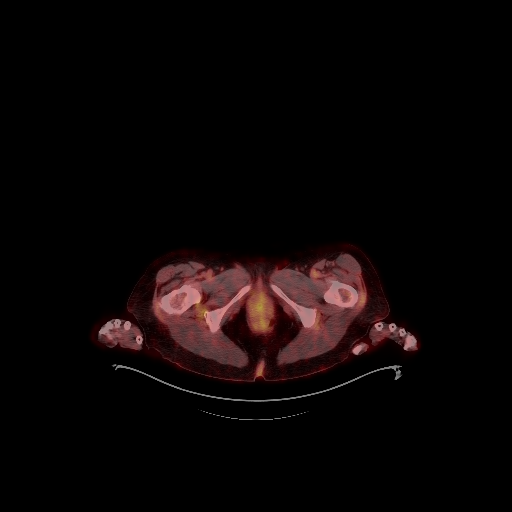
[im 321/428]
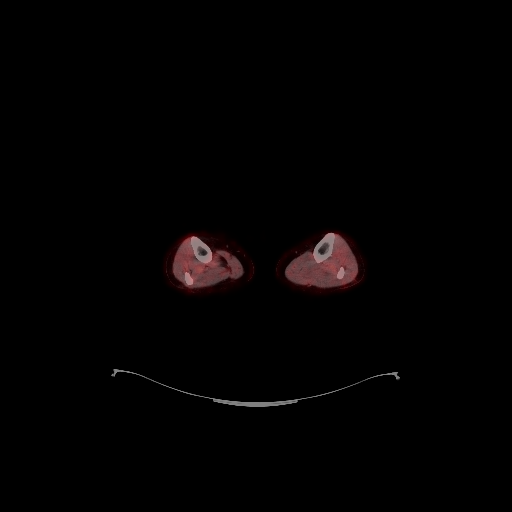
[im 428/428]
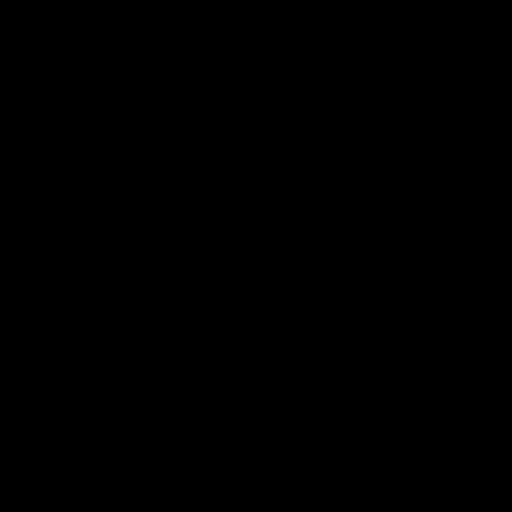

[Series 1319: results mm oncology reading · 1.5mm · 0.96mm/px · 1 of 1 slices shown]
[im 1/1]
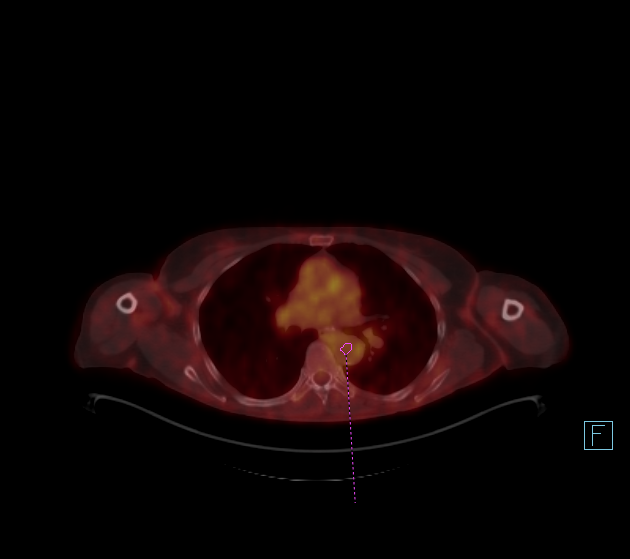

[25 of 25 positions shown; findings below may reference images not displayed]

FINDINGS: Mediastinal blood pool activity: SUV max

HEAD/NECK: No hypermetabolic activity in the scalp. No
hypermetabolic cervical lymph nodes.

Incidental CT findings: none

CHEST: No hypermetabolic mediastinal or hilar nodes. No suspicious
pulmonary nodules on the CT scan.

Incidental CT findings: none

ABDOMEN/PELVIS: No abnormal hypermetabolic activity within the
liver, pancreas, adrenal glands, or spleen. No hypermetabolic lymph
nodes in the abdomen or pelvis.

Incidental CT findings: none

SKELETON: No focal activity in the axillary or appendicular skeleton
to suggest active multiple myeloma.

Incidental CT findings: No lytic lesions on bone survey.

EXTREMITIES: Uptake associated with the RIGHT knee prosthetic
consists with inflammatory metabolic activity. Movement of the lower
extremities below the knee on the PET imaging degrades images. No
lesions evident on the CT portion.

Incidental CT findings: No lytic lesions within the extremities.
IMPRESSION: 1. No focal activity within the axillary appendicular skeleton to
suggest active multiple myeloma.
2. No lytic skeletal lesions within the CT portion.

## 2020-12-31 ENCOUNTER — Encounter: Payer: Self-pay | Admitting: Hematology

## 2021-08-24 ENCOUNTER — Encounter: Payer: Self-pay | Admitting: Orthopaedic Surgery

## 2021-08-25 NOTE — Telephone Encounter (Signed)
I have done it. Do you need me to enter it?

## 2021-08-25 NOTE — Telephone Encounter (Signed)
I meant I have done it before, without paper documentation.

## 2021-10-04 ENCOUNTER — Other Ambulatory Visit (HOSPITAL_BASED_OUTPATIENT_CLINIC_OR_DEPARTMENT_OTHER): Payer: Self-pay

## 2021-10-04 ENCOUNTER — Encounter: Payer: Self-pay | Admitting: Orthopaedic Surgery

## 2021-10-05 ENCOUNTER — Other Ambulatory Visit (HOSPITAL_BASED_OUTPATIENT_CLINIC_OR_DEPARTMENT_OTHER): Payer: Self-pay

## 2021-10-05 MED ORDER — ZOSTER VAC RECOMB ADJUVANTED 50 MCG/0.5ML IM SUSR
INTRAMUSCULAR | 0 refills | Status: DC
  Filled 2021-10-05: qty 0.5, 1d supply, fill #0

## 2021-11-16 ENCOUNTER — Encounter: Payer: Self-pay | Admitting: Orthopaedic Surgery

## 2021-11-29 ENCOUNTER — Other Ambulatory Visit (HOSPITAL_BASED_OUTPATIENT_CLINIC_OR_DEPARTMENT_OTHER): Payer: Self-pay

## 2021-11-29 MED ORDER — ZOSTER VAC RECOMB ADJUVANTED 50 MCG/0.5ML IM SUSR
INTRAMUSCULAR | 0 refills | Status: AC
  Filled 2021-11-29: qty 0.5, 1d supply, fill #0
